# Patient Record
Sex: Female | Born: 1975
Health system: Southern US, Community
[De-identification: ages and names within clinical notes are randomized; demographics above are authoritative.]

## PROBLEM LIST (undated history)

## (undated) DIAGNOSIS — E0590 Thyrotoxicosis, unspecified without thyrotoxic crisis or storm: Secondary | ICD-10-CM

## (undated) DIAGNOSIS — R7989 Other specified abnormal findings of blood chemistry: Secondary | ICD-10-CM

## (undated) DIAGNOSIS — T7840XA Allergy, unspecified, initial encounter: Secondary | ICD-10-CM

## (undated) DIAGNOSIS — G4710 Hypersomnia, unspecified: Secondary | ICD-10-CM

## (undated) HISTORY — DX: Other specified abnormal findings of blood chemistry: R79.89

## (undated) HISTORY — DX: Allergy, unspecified, initial encounter: T78.40XA

## (undated) HISTORY — DX: Thyrotoxicosis, unspecified without thyrotoxic crisis or storm: E05.90

## (undated) HISTORY — PX: DILATION AND CURETTAGE OF UTERUS: SHX78

## (undated) HISTORY — DX: Hypersomnia, unspecified: G47.10

## (undated) HISTORY — PX: TUBAL LIGATION: SHX77

---

## 1998-08-04 ENCOUNTER — Emergency Department (HOSPITAL_COMMUNITY): Admission: EM | Admit: 1998-08-04 | Discharge: 1998-08-04 | Payer: Self-pay | Admitting: Emergency Medicine

## 1999-03-06 ENCOUNTER — Emergency Department (HOSPITAL_COMMUNITY): Admission: EM | Admit: 1999-03-06 | Discharge: 1999-03-06 | Payer: Self-pay | Admitting: Emergency Medicine

## 1999-07-02 ENCOUNTER — Emergency Department (HOSPITAL_COMMUNITY): Admission: EM | Admit: 1999-07-02 | Discharge: 1999-07-02 | Payer: Self-pay | Admitting: Emergency Medicine

## 2000-01-14 ENCOUNTER — Inpatient Hospital Stay (HOSPITAL_COMMUNITY): Admission: AD | Admit: 2000-01-14 | Discharge: 2000-01-14 | Payer: Self-pay | Admitting: *Deleted

## 2000-01-15 ENCOUNTER — Ambulatory Visit (HOSPITAL_COMMUNITY): Admission: RE | Admit: 2000-01-15 | Discharge: 2000-01-15 | Payer: Self-pay | Admitting: *Deleted

## 2000-01-23 ENCOUNTER — Encounter: Payer: Self-pay | Admitting: *Deleted

## 2000-01-23 ENCOUNTER — Ambulatory Visit (HOSPITAL_COMMUNITY): Admission: RE | Admit: 2000-01-23 | Discharge: 2000-01-23 | Payer: Self-pay | Admitting: *Deleted

## 2000-03-08 ENCOUNTER — Inpatient Hospital Stay (HOSPITAL_COMMUNITY): Admission: AD | Admit: 2000-03-08 | Discharge: 2000-03-08 | Payer: Self-pay | Admitting: Obstetrics

## 2000-03-09 ENCOUNTER — Emergency Department (HOSPITAL_COMMUNITY): Admission: EM | Admit: 2000-03-09 | Discharge: 2000-03-09 | Payer: Self-pay | Admitting: Emergency Medicine

## 2000-03-13 ENCOUNTER — Other Ambulatory Visit: Admission: RE | Admit: 2000-03-13 | Discharge: 2000-03-13 | Payer: Self-pay | Admitting: Obstetrics & Gynecology

## 2000-08-21 ENCOUNTER — Inpatient Hospital Stay (HOSPITAL_COMMUNITY): Admission: AD | Admit: 2000-08-21 | Discharge: 2000-08-21 | Payer: Self-pay | Admitting: Obstetrics & Gynecology

## 2000-09-01 ENCOUNTER — Inpatient Hospital Stay (HOSPITAL_COMMUNITY): Admission: AD | Admit: 2000-09-01 | Discharge: 2000-09-04 | Payer: Self-pay | Admitting: Obstetrics & Gynecology

## 2000-09-05 ENCOUNTER — Encounter: Admission: RE | Admit: 2000-09-05 | Discharge: 2000-10-05 | Payer: Self-pay | Admitting: Obstetrics & Gynecology

## 2000-10-06 ENCOUNTER — Encounter: Admission: RE | Admit: 2000-10-06 | Discharge: 2000-11-05 | Payer: Self-pay | Admitting: Obstetrics & Gynecology

## 2000-10-14 ENCOUNTER — Other Ambulatory Visit: Admission: RE | Admit: 2000-10-14 | Discharge: 2000-10-14 | Payer: Self-pay | Admitting: Obstetrics & Gynecology

## 2000-11-06 ENCOUNTER — Encounter: Admission: RE | Admit: 2000-11-06 | Discharge: 2000-12-06 | Payer: Self-pay | Admitting: Obstetrics & Gynecology

## 2001-04-05 ENCOUNTER — Emergency Department (HOSPITAL_COMMUNITY): Admission: EM | Admit: 2001-04-05 | Discharge: 2001-04-05 | Payer: Self-pay | Admitting: Emergency Medicine

## 2001-04-09 ENCOUNTER — Emergency Department (HOSPITAL_COMMUNITY): Admission: EM | Admit: 2001-04-09 | Discharge: 2001-04-09 | Payer: Self-pay | Admitting: Emergency Medicine

## 2001-04-09 ENCOUNTER — Encounter: Payer: Self-pay | Admitting: Emergency Medicine

## 2001-06-05 ENCOUNTER — Emergency Department (HOSPITAL_COMMUNITY): Admission: EM | Admit: 2001-06-05 | Discharge: 2001-06-06 | Payer: Self-pay

## 2001-11-22 ENCOUNTER — Other Ambulatory Visit: Admission: RE | Admit: 2001-11-22 | Discharge: 2001-11-22 | Payer: Self-pay | Admitting: Obstetrics & Gynecology

## 2002-01-25 ENCOUNTER — Encounter: Payer: Self-pay | Admitting: Emergency Medicine

## 2002-01-25 ENCOUNTER — Emergency Department (HOSPITAL_COMMUNITY): Admission: EM | Admit: 2002-01-25 | Discharge: 2002-01-25 | Payer: Self-pay | Admitting: Emergency Medicine

## 2002-04-23 ENCOUNTER — Encounter: Payer: Self-pay | Admitting: Emergency Medicine

## 2002-04-23 ENCOUNTER — Emergency Department (HOSPITAL_COMMUNITY): Admission: EM | Admit: 2002-04-23 | Discharge: 2002-04-23 | Payer: Self-pay | Admitting: Emergency Medicine

## 2002-06-08 ENCOUNTER — Emergency Department (HOSPITAL_COMMUNITY): Admission: EM | Admit: 2002-06-08 | Discharge: 2002-06-08 | Payer: Self-pay | Admitting: Emergency Medicine

## 2002-10-22 ENCOUNTER — Emergency Department (HOSPITAL_COMMUNITY): Admission: EM | Admit: 2002-10-22 | Discharge: 2002-10-22 | Payer: Self-pay | Admitting: Emergency Medicine

## 2003-03-31 ENCOUNTER — Other Ambulatory Visit: Admission: RE | Admit: 2003-03-31 | Discharge: 2003-03-31 | Payer: Self-pay | Admitting: Obstetrics & Gynecology

## 2003-05-09 ENCOUNTER — Emergency Department (HOSPITAL_COMMUNITY): Admission: EM | Admit: 2003-05-09 | Discharge: 2003-05-09 | Payer: Self-pay | Admitting: Emergency Medicine

## 2003-06-20 ENCOUNTER — Emergency Department (HOSPITAL_COMMUNITY): Admission: EM | Admit: 2003-06-20 | Discharge: 2003-06-20 | Payer: Self-pay | Admitting: Emergency Medicine

## 2004-02-01 ENCOUNTER — Ambulatory Visit: Payer: Self-pay | Admitting: Internal Medicine

## 2004-02-07 ENCOUNTER — Inpatient Hospital Stay (HOSPITAL_COMMUNITY): Admission: AD | Admit: 2004-02-07 | Discharge: 2004-02-07 | Payer: Self-pay | Admitting: Obstetrics & Gynecology

## 2004-06-13 ENCOUNTER — Other Ambulatory Visit: Admission: RE | Admit: 2004-06-13 | Discharge: 2004-06-13 | Payer: Self-pay | Admitting: Obstetrics & Gynecology

## 2004-08-01 ENCOUNTER — Ambulatory Visit (HOSPITAL_COMMUNITY): Admission: AD | Admit: 2004-08-01 | Discharge: 2004-08-02 | Payer: Self-pay | Admitting: Obstetrics & Gynecology

## 2004-08-02 ENCOUNTER — Encounter (INDEPENDENT_AMBULATORY_CARE_PROVIDER_SITE_OTHER): Payer: Self-pay | Admitting: Specialist

## 2004-08-15 ENCOUNTER — Ambulatory Visit: Payer: Self-pay | Admitting: Internal Medicine

## 2004-08-21 ENCOUNTER — Ambulatory Visit: Payer: Self-pay | Admitting: Endocrinology

## 2004-09-17 ENCOUNTER — Ambulatory Visit: Payer: Self-pay | Admitting: Endocrinology

## 2004-10-04 ENCOUNTER — Ambulatory Visit: Payer: Self-pay | Admitting: Endocrinology

## 2004-10-25 ENCOUNTER — Ambulatory Visit: Payer: Self-pay | Admitting: Endocrinology

## 2004-11-06 ENCOUNTER — Ambulatory Visit: Payer: Self-pay | Admitting: Endocrinology

## 2005-01-10 ENCOUNTER — Ambulatory Visit: Payer: Self-pay | Admitting: Endocrinology

## 2005-04-08 ENCOUNTER — Ambulatory Visit: Payer: Self-pay | Admitting: Endocrinology

## 2005-04-15 ENCOUNTER — Ambulatory Visit: Payer: Self-pay | Admitting: Internal Medicine

## 2005-05-15 ENCOUNTER — Inpatient Hospital Stay (HOSPITAL_COMMUNITY): Admission: AD | Admit: 2005-05-15 | Discharge: 2005-05-15 | Payer: Self-pay | Admitting: Obstetrics and Gynecology

## 2005-05-20 ENCOUNTER — Ambulatory Visit (HOSPITAL_COMMUNITY): Admission: RE | Admit: 2005-05-20 | Discharge: 2005-05-20 | Payer: Self-pay | Admitting: Obstetrics and Gynecology

## 2005-06-10 ENCOUNTER — Encounter (HOSPITAL_COMMUNITY): Admission: RE | Admit: 2005-06-10 | Discharge: 2005-09-08 | Payer: Self-pay | Admitting: Endocrinology

## 2005-06-20 ENCOUNTER — Ambulatory Visit: Payer: Self-pay | Admitting: Internal Medicine

## 2005-06-26 ENCOUNTER — Ambulatory Visit (HOSPITAL_COMMUNITY): Admission: RE | Admit: 2005-06-26 | Discharge: 2005-06-26 | Payer: Self-pay | Admitting: Endocrinology

## 2005-06-30 ENCOUNTER — Ambulatory Visit: Payer: Self-pay | Admitting: Endocrinology

## 2005-07-23 ENCOUNTER — Ambulatory Visit: Payer: Self-pay | Admitting: Endocrinology

## 2005-08-27 ENCOUNTER — Ambulatory Visit: Payer: Self-pay | Admitting: Endocrinology

## 2005-09-26 ENCOUNTER — Ambulatory Visit: Payer: Self-pay | Admitting: Endocrinology

## 2005-10-31 ENCOUNTER — Inpatient Hospital Stay (HOSPITAL_COMMUNITY): Admission: AD | Admit: 2005-10-31 | Discharge: 2005-10-31 | Payer: Self-pay | Admitting: *Deleted

## 2005-10-31 ENCOUNTER — Ambulatory Visit: Payer: Self-pay | Admitting: Endocrinology

## 2005-11-18 ENCOUNTER — Ambulatory Visit: Payer: Self-pay | Admitting: Endocrinology

## 2005-12-19 ENCOUNTER — Ambulatory Visit: Payer: Self-pay | Admitting: Endocrinology

## 2006-01-16 ENCOUNTER — Ambulatory Visit: Payer: Self-pay | Admitting: Endocrinology

## 2006-02-17 ENCOUNTER — Ambulatory Visit: Payer: Self-pay | Admitting: Endocrinology

## 2006-02-18 ENCOUNTER — Encounter: Payer: Self-pay | Admitting: Internal Medicine

## 2006-02-18 ENCOUNTER — Ambulatory Visit: Payer: Self-pay

## 2006-02-19 ENCOUNTER — Inpatient Hospital Stay (HOSPITAL_COMMUNITY): Admission: AD | Admit: 2006-02-19 | Discharge: 2006-02-19 | Payer: Self-pay | Admitting: Obstetrics and Gynecology

## 2006-02-25 ENCOUNTER — Inpatient Hospital Stay (HOSPITAL_COMMUNITY): Admission: RE | Admit: 2006-02-25 | Discharge: 2006-02-27 | Payer: Self-pay | Admitting: Obstetrics and Gynecology

## 2006-02-25 ENCOUNTER — Encounter (INDEPENDENT_AMBULATORY_CARE_PROVIDER_SITE_OTHER): Payer: Self-pay | Admitting: *Deleted

## 2006-08-03 ENCOUNTER — Encounter: Payer: Self-pay | Admitting: *Deleted

## 2006-08-03 DIAGNOSIS — E0590 Thyrotoxicosis, unspecified without thyrotoxic crisis or storm: Secondary | ICD-10-CM | POA: Insufficient documentation

## 2006-08-03 HISTORY — DX: Thyrotoxicosis, unspecified without thyrotoxic crisis or storm: E05.90

## 2006-08-24 ENCOUNTER — Ambulatory Visit: Payer: Self-pay | Admitting: Endocrinology

## 2006-09-24 ENCOUNTER — Ambulatory Visit: Payer: Self-pay | Admitting: Endocrinology

## 2006-11-19 ENCOUNTER — Encounter: Payer: Self-pay | Admitting: Endocrinology

## 2006-11-19 ENCOUNTER — Ambulatory Visit: Payer: Self-pay | Admitting: Endocrinology

## 2006-11-19 LAB — CONVERTED CEMR LAB
Basophils Absolute: 0 10*3/uL (ref 0.0–0.1)
Basophils Relative: 0.1 % (ref 0.0–1.0)
Eosinophils Absolute: 0.3 10*3/uL (ref 0.0–0.6)
Free T4: 0.7 ng/dL (ref 0.6–1.6)
HCT: 40.7 % (ref 36.0–46.0)
Hemoglobin: 13.9 g/dL (ref 12.0–15.0)
Lymphocytes Relative: 34.1 % (ref 12.0–46.0)
MCHC: 34.2 g/dL (ref 30.0–36.0)
MCV: 85.6 fL (ref 78.0–100.0)
Monocytes Relative: 4.4 % (ref 3.0–11.0)
Neutrophils Relative %: 55.7 % (ref 43.0–77.0)
Platelets: 370 10*3/uL (ref 150–400)
TSH: 0.38 microintl units/mL (ref 0.35–5.50)
WBC: 5.8 10*3/uL (ref 4.5–10.5)

## 2007-06-30 ENCOUNTER — Ambulatory Visit: Payer: Self-pay | Admitting: Endocrinology

## 2008-01-19 ENCOUNTER — Ambulatory Visit: Payer: Self-pay | Admitting: Endocrinology

## 2008-01-19 DIAGNOSIS — R5383 Other fatigue: Secondary | ICD-10-CM

## 2008-01-19 DIAGNOSIS — R5381 Other malaise: Secondary | ICD-10-CM | POA: Insufficient documentation

## 2008-01-19 LAB — CONVERTED CEMR LAB
Basophils Absolute: 0 10*3/uL (ref 0.0–0.1)
Basophils Relative: 0.4 % (ref 0.0–3.0)
Calcium: 9 mg/dL (ref 8.4–10.5)
Chloride: 106 meq/L (ref 96–112)
Eosinophils Absolute: 0.1 10*3/uL (ref 0.0–0.7)
GFR calc Af Amer: 126 mL/min
GFR calc non Af Amer: 104 mL/min
Hemoglobin: 13.8 g/dL (ref 12.0–15.0)
Monocytes Absolute: 0.5 10*3/uL (ref 0.1–1.0)
Platelets: 305 10*3/uL (ref 150–400)
RBC: 4.59 M/uL (ref 3.87–5.11)
Sodium: 138 meq/L (ref 135–145)
TSH: 2.01 microintl units/mL (ref 0.35–5.50)

## 2008-04-03 ENCOUNTER — Telehealth (INDEPENDENT_AMBULATORY_CARE_PROVIDER_SITE_OTHER): Payer: Self-pay | Admitting: *Deleted

## 2008-04-04 ENCOUNTER — Ambulatory Visit: Payer: Self-pay | Admitting: Endocrinology

## 2008-07-17 ENCOUNTER — Ambulatory Visit: Payer: Self-pay | Admitting: Endocrinology

## 2008-07-17 LAB — CONVERTED CEMR LAB: Free T4: 0.8 ng/dL (ref 0.6–1.6)

## 2008-09-21 ENCOUNTER — Ambulatory Visit: Payer: Self-pay | Admitting: Endocrinology

## 2008-09-21 DIAGNOSIS — D72819 Decreased white blood cell count, unspecified: Secondary | ICD-10-CM | POA: Insufficient documentation

## 2008-09-21 LAB — CONVERTED CEMR LAB
BUN: 12 mg/dL (ref 6–23)
Basophils Absolute: 0 10*3/uL (ref 0.0–0.1)
CO2: 29 meq/L (ref 19–32)
Eosinophils Absolute: 0.2 10*3/uL (ref 0.0–0.7)
Eosinophils Relative: 4.2 % (ref 0.0–5.0)
HCT: 39.8 % (ref 36.0–46.0)
MCV: 88 fL (ref 78.0–100.0)
Monocytes Relative: 7.8 % (ref 3.0–12.0)
Neutrophils Relative %: 47 % (ref 43.0–77.0)
RBC: 4.52 M/uL (ref 3.87–5.11)
RDW: 12.2 % (ref 11.5–14.6)
TSH: 1.21 microintl units/mL (ref 0.35–5.50)
WBC: 3.9 10*3/uL — ABNORMAL LOW (ref 4.5–10.5)

## 2008-12-01 ENCOUNTER — Ambulatory Visit: Payer: Self-pay | Admitting: Endocrinology

## 2008-12-01 LAB — CONVERTED CEMR LAB: Free T4: 0.8 ng/dL (ref 0.6–1.6)

## 2008-12-15 ENCOUNTER — Ambulatory Visit: Payer: Self-pay | Admitting: Endocrinology

## 2008-12-15 LAB — CONVERTED CEMR LAB: Free T4: 0.7 ng/dL (ref 0.6–1.6)

## 2009-01-30 ENCOUNTER — Ambulatory Visit: Payer: Self-pay | Admitting: Endocrinology

## 2009-03-05 ENCOUNTER — Ambulatory Visit: Payer: Self-pay | Admitting: Internal Medicine

## 2009-03-05 DIAGNOSIS — R05 Cough: Secondary | ICD-10-CM | POA: Insufficient documentation

## 2009-03-05 DIAGNOSIS — R059 Cough, unspecified: Secondary | ICD-10-CM | POA: Insufficient documentation

## 2009-03-05 DIAGNOSIS — R052 Subacute cough: Secondary | ICD-10-CM | POA: Insufficient documentation

## 2009-04-27 ENCOUNTER — Ambulatory Visit: Payer: Self-pay | Admitting: Endocrinology

## 2009-06-08 ENCOUNTER — Ambulatory Visit (HOSPITAL_COMMUNITY): Admission: RE | Admit: 2009-06-08 | Discharge: 2009-06-08 | Payer: Self-pay | Admitting: Obstetrics and Gynecology

## 2009-06-10 ENCOUNTER — Inpatient Hospital Stay (HOSPITAL_COMMUNITY): Admission: AD | Admit: 2009-06-10 | Discharge: 2009-06-10 | Payer: Self-pay | Admitting: Obstetrics and Gynecology

## 2009-06-11 ENCOUNTER — Ambulatory Visit: Payer: Self-pay | Admitting: Obstetrics and Gynecology

## 2009-06-11 ENCOUNTER — Encounter (INDEPENDENT_AMBULATORY_CARE_PROVIDER_SITE_OTHER): Payer: Self-pay | Admitting: Obstetrics and Gynecology

## 2009-06-11 ENCOUNTER — Inpatient Hospital Stay (HOSPITAL_COMMUNITY): Admission: AD | Admit: 2009-06-11 | Discharge: 2009-06-14 | Payer: Self-pay | Admitting: Obstetrics and Gynecology

## 2009-08-24 ENCOUNTER — Ambulatory Visit: Payer: Self-pay | Admitting: Endocrinology

## 2009-11-09 ENCOUNTER — Ambulatory Visit: Payer: Self-pay | Admitting: Endocrinology

## 2009-11-09 LAB — CONVERTED CEMR LAB: TSH: 0.68 microintl units/mL (ref 0.35–5.50)

## 2010-03-03 ENCOUNTER — Encounter: Payer: Self-pay | Admitting: Endocrinology

## 2010-03-12 NOTE — Miscellaneous (Signed)
Summary: Doctor, [facility]   Imported By: Lester [place] 05/04/2009 09:04:05  _____________________________________________________________________  External Attachment:    Type:   Image     Comment:   External Document

## 2010-03-12 NOTE — Assessment & Plan Note (Signed)
Summary: FU Janet [name] #   Vital Signs:  Patient profile:   35 year old female Height:      69 inches (175.26 cm) Weight:      164.13 pounds (74.60 kg) BMI:     24.33 O2 Sat:      97 % on Room air Temp:     98.1 degrees F (36.72 degrees C) oral Pulse rate:   88 / minute BP sitting:   104 / 66  (left arm) Cuff size:   regular  Vitals Entered By: Brenton Grills MA (November 09, 2009 3:22 PM)  O2 Flow:  Room air CC: F/U appt/aj   Primary Provider:  Jonny Ruiz  CC:  F/U appt/aj.  History of Present Illness: pt is now 5 mos postpartum.  she takes tapazole 5 mg once daily.  pt states she feels well in general, except for fatigue.  she is not breast-feeding.  she has lost a few lbs since last ov.  Current Medications (verified): 1)  Pre-Natal .... Once Daily 2)  Methimazole 5 Mg Tabs (Methimazole) .Marland Kitchen.. 1 Once Daily  Allergies (verified): No Known Drug Allergies  Past History:  Past Medical History: Last updated: 01/19/2008 HYPERTHYROIDISM (ICD-242.90)  Review of Systems  The patient denies fever.    Physical Exam  General:  normal appearance.   Neck:  thyroid is minimally enlarged on the right lobe.  normal on the left.   Additional Exam:  FastTSH                   0.68 uIU/mL                 0.35-5.50 Free T4                   0.90 ng/dL       Impression & Recommendations:  Problem # 1:  HYPERTHYROIDISM (ICD-242.90) well-controlled  Other Orders: TLB-TSH (Thyroid Stimulating Hormone) (84443-TSH) TLB-T4 (Thyrox), Free [id number]) Est. Patient Level III (40981)  Patient Instructions: 1)  blood tests are being ordered for you today.  please call [phone] to hear your test results. 2)  Please schedule a follow-up appointment in 3-4 months. 3)  (update: i left message on phone-tree: same rx)

## 2010-03-12 NOTE — Assessment & Plan Note (Signed)
Summary: f/u appt/#/cd   Vital Signs:  Patient profile:   35 year old female Height:      69 inches (175.26 cm) Weight:      171 pounds (77.73 kg) BMI:     25.34 O2 Sat:      97 % on Room air Temp:     98.1 degrees F (36.72 degrees C) oral Pulse rate:   70 / minute BP sitting:   112 / 78  (left arm) Cuff size:   regular  Vitals Entered By: Brenton Grills MA (August 24, 2009 3:24 PM)  O2 Flow:  Room air CC: F/U appt/Pt is currently not taking any medications/aj   Primary Provider:  Jonny Ruiz  CC:  F/U appt/Pt is currently not taking any medications/aj.  History of Present Illness: now 2 mos postpartum.  pt states she feels well in general, except for fatigue.  she gained 40 lbs with the pregnancy, and has since re-lost about half of that.  she is no longer breast-feeding.    Current Medications (verified): 1)  Pre-Natal .... Once Daily  Allergies (verified): No Known Drug Allergies  Past History:  Past Medical History: Last updated: 01/19/2008 HYPERTHYROIDISM (ICD-242.90)  Review of Systems       denies neck pain  Physical Exam  General:  normal appearance.   Neck:  thyroid is minimally enlarged on the right lobe.  normal on the left.   Additional Exam:  FastTSH                   0.51 uIU/mL                 0.35-5.50 Free T4                   0.95 ng/dL     Impression & Recommendations:  Problem # 1:  HYPERTHYROIDISM (ICD-242.90) blood tests say this will recur soon  Medications Added to Medication List This Visit: 1)  Methimazole 5 Mg Tabs (Methimazole) .Marland Kitchen.. 1 once daily  Other Orders: TLB-TSH (Thyroid Stimulating Hormone) (84443-TSH) TLB-T4 (Thyrox), Free [phone]) Est. Patient Level III (14782)  Patient Instructions: 1)  tests are being ordered for you today.  please call [phone] to hear your test results. 2)  pending the test results, please stay-off thyroid medication, and return 3 months. 3)  (update: i left message on phone-tree:  take tapazole 5  mg once daily.  if ever you have fever while taking this medication, stop it and call us, because of the risk of a rare side-effect). Prescriptions: METHIMAZOLE 5 MG TABS (METHIMAZOLE) 1 once daily  #30 x 3   Entered and Authorized by:   Minus Breeding MD   Signed by:   Minus Breeding MD on 08/24/2009   Method used:   Electronically to        [facility] [address] [phone]* (retail)       [address]       [place], [place]  [address]       Ph: [phone] or [phone]       Fax: [phone]   RxID:   [id number]

## 2010-03-12 NOTE — Assessment & Plan Note (Signed)
Summary: DR Sammuel Cooper PT--COUGH-CHEST CONGESTION-D/T-- [facility]   Vital Signs:  Patient profile:   35 year old female Height:      69 inches (175.26 cm) Weight:      175.8 pounds (79.91 kg) O2 Sat:      97 % on Room air Temp:     97.7 degrees F (36.50 degrees C) oral Pulse rate:   93 / minute BP sitting:   112 / 60  (left arm) Cuff size:   regular  Vitals Entered By: Orlan Leavens (March 05, 2009 8:59 AM)  O2 Flow:  Room air CC: Ongoing cough/ chest congestion/ Pt is 6 month pregnant Is Patient Diabetic? No Pain Assessment Patient in pain? no        Primary Care Provider:  Jonny Ruiz  CC:  Ongoing cough/ chest congestion/ Pt is 6 month pregnant.  History of Present Illness: here with c/o cough - min sputum but cough spells with deep rattle occuring few times day precipitated by chest cold in Dec no fever or ST +reflux symptoms    Current Medications (verified): 1)  Pre-Natal .... Once Daily  Allergies (verified): No Known Drug Allergies  Past History:  Past Medical History: Last updated: 01/19/2008 HYPERTHYROIDISM (ICD-242.90)  Review of Systems       The patient complains of prolonged cough.  The patient denies fever, hoarseness, chest pain, syncope, and abdominal pain.    Physical Exam  General:  normal appearance.  obviously pregnant - son at side Ears:  normal pinnae bilaterally, without erythema, swelling, or tenderness to palpation. TMs clear, without effusion, or cerumen impaction. Hearing grossly normal bilaterally  Mouth:  teeth and gums in good repair; mucous membranes moist, without lesions or ulcers. oropharynx clear without exudate, no erythema.  Lungs:  normal respiratory effort, no intercostal retractions or use of accessory muscles; normal breath sounds bilaterally - no crackles and no wheezes.    Heart:  normal rate, regular rhythm, no murmur, and no rub. BLE without edema.    Impression & Recommendations:  Problem # 1:  COUGH (ICD-786.2) no evidence  of infection (afeb, no sputum, lungs clear and normal O2) - so no abx add scheduled Mucinex consider tx of GERD if persiting cough, esp with adv pregnancy  Complete Medication List: 1)  Pre-natal  .... Once daily 2)  Mucinex 600 Mg Xr12h-tab (Guaifenesin) .Marland Kitchen.. 1 by mouth every 12 hours as needed for cough  Patient Instructions: 1)  it was good to see you today.  2)  if you develop worseing symptoms or fever, call us and we can recosider antibiotics but it does not appear necessary to use any anitbiotic at this time  3)  try adding Mucinex 1-2/day for cough as discussed - 4)  if still not improving on this medication, ask your obstertrician about adding medication for reflux as this may be exacerbating your cough symptoms  5)  Please schedule a follow-up appointment as needed.

## 2010-03-12 NOTE — Assessment & Plan Note (Signed)
Summary: FU---[facility]C   Vital Signs:  Patient profile:   35 year old female Height:      69 inches (175.26 cm) Weight:      186.50 pounds (84.77 kg) O2 Sat:      97 % on Room air Temp:     97.8 degrees F (36.56 degrees C) oral Pulse rate:   96 / minute BP sitting:   102 / 58  (left arm) Cuff size:   regular  Vitals Entered By: Josph Macho RMA (April 27, 2009 9:10 AM)  O2 Flow:  Room air CC: Follow-up visit/ pt states she is no longer taking Mucinex/ CF   Primary Provider:  Jonny Ruiz  CC:  Follow-up visit/ pt states she is no longer taking Mucinex/ CF.  History of Present Illness: pt is now [redacted] weeks gestation.  pt states she feels well in general.  she has gained 34 lbs this pregnancy.  she has been off thyroid medication for a few months now.  Current Medications (verified): 1)  Pre-Natal .... Once Daily 2)  Mucinex 600 Mg Xr12h-Tab (Guaifenesin) .Marland Kitchen.. 1 By Mouth Every 12 Hours As Needed For Cough  Allergies (verified): No Known Drug Allergies  Past History:  Past Medical History: Last updated: 01/19/2008 HYPERTHYROIDISM (ICD-242.90)  Review of Systems  The patient denies fever.    Physical Exam  General:  gravid.  no distress. Neck:  thyroid is not enlarged.  no nodule Neurologic:  no tremor Skin:  normal texture and temp.  no rash.  not diaphoretic  Psych:  Alert and cooperative; normal mood and affect; normal attention span and concentration.   Additional Exam:  FastTSH                   1.32 uIU/mL                 0.35-5.50 Free T4                   0.6 ng/dL                   4.4-0.3    Impression & Recommendations:  Problem # 1:  HYPERTHYROIDISM (ICD-242.90) normal off medication now  Other Orders: TLB-TSH (Thyroid Stimulating Hormone) (84443-TSH) TLB-T4 (Thyrox), Free [id number]) Est. Patient Level III (87564)  Patient Instructions: 1)  tests are being ordered for you today.  a few days after the test(s), please call [phone] to hear your test  results. 2)  pending the test results, please stay-off thyroid medications for now, and return 2 months after you have the baby. 3)  (update: i left message on phone-tree:  rx as we discussed)

## 2010-04-02 ENCOUNTER — Telehealth: Payer: Self-pay | Admitting: Internal Medicine

## 2010-04-04 ENCOUNTER — Ambulatory Visit: Payer: Self-pay | Admitting: Internal Medicine

## 2010-04-05 ENCOUNTER — Other Ambulatory Visit: Payer: Self-pay | Admitting: Internal Medicine

## 2010-04-05 ENCOUNTER — Encounter (INDEPENDENT_AMBULATORY_CARE_PROVIDER_SITE_OTHER): Payer: Self-pay | Admitting: *Deleted

## 2010-04-05 ENCOUNTER — Other Ambulatory Visit: Payer: Self-pay

## 2010-04-05 DIAGNOSIS — Z0000 Encounter for general adult medical examination without abnormal findings: Secondary | ICD-10-CM

## 2010-04-05 LAB — HEPATIC FUNCTION PANEL
AST: 18 U/L (ref 0–37)
Albumin: 4.1 g/dL (ref 3.5–5.2)
Alkaline Phosphatase: 66 U/L (ref 39–117)
Bilirubin, Direct: 0.1 mg/dL (ref 0.0–0.3)
Total Protein: 7 g/dL (ref 6.0–8.3)

## 2010-04-05 LAB — CBC WITH DIFFERENTIAL/PLATELET
Basophils Absolute: 0 10*3/uL (ref 0.0–0.1)
Eosinophils Absolute: 0.3 10*3/uL (ref 0.0–0.7)
HCT: 38.6 % (ref 36.0–46.0)
Hemoglobin: 13.4 g/dL (ref 12.0–15.0)
Lymphs Abs: 1.7 10*3/uL (ref 0.7–4.0)
MCHC: 34.6 g/dL (ref 30.0–36.0)
MCV: 87.4 fl (ref 78.0–100.0)
Neutro Abs: 2.6 10*3/uL (ref 1.4–7.7)
RBC: 4.42 Mil/uL (ref 3.87–5.11)

## 2010-04-05 LAB — BASIC METABOLIC PANEL
BUN: 16 mg/dL (ref 6–23)
CO2: 28 mEq/L (ref 19–32)
Creatinine, Ser: 0.7 mg/dL (ref 0.4–1.2)
Glucose, Bld: 85 mg/dL (ref 70–99)

## 2010-04-05 LAB — URINALYSIS
Bilirubin Urine: NEGATIVE
Total Protein, Urine: NEGATIVE
Urobilinogen, UA: 0.2 (ref 0.0–1.0)
pH: 7 (ref 5.0–8.0)

## 2010-04-05 LAB — LIPID PANEL: Triglycerides: 42 mg/dL (ref 0.0–149.0)

## 2010-04-09 NOTE — Progress Notes (Signed)
Summary: Lab req  Phone Note Call from Patient   Caller: Patient Summary of Call: Pt called stating she has been feeling extremely fatigued and is requesting all labs included in CPX. Pt will schedule appt to discuss results once labs have been scheduled. Initial call taken by: Margaret Pyle, CMA,  April 02, 2010 3:20 PM  Follow-up for Phone Call        ok to add pt to sched with CPX labs prior Follow-up by: Corwin Levins MD,  April 02, 2010 5:32 PM  Additional Follow-up for Phone Call Additional follow up Details #1::        Harriett Sine can we work this pt in per JWJ? Additional Follow-up by: Margaret Pyle, CMA,  April 03, 2010 8:23 AM    Additional Follow-up for Phone Call Additional follow up Details #2::    PT IS SCHEDULED TO COME TO THE LAB ON FRIDAY FEB 24 AND SHE IS WORKED IN  FOR A CPX APPT ON WED FEB 29 AT 2PM.  PT IS AWARE. Follow-up by: Hilarie Fredrickson,  April 03, 2010 9:25 AM

## 2010-04-10 ENCOUNTER — Encounter: Payer: Self-pay | Admitting: Internal Medicine

## 2010-04-10 ENCOUNTER — Ambulatory Visit (INDEPENDENT_AMBULATORY_CARE_PROVIDER_SITE_OTHER): Payer: PRIVATE HEALTH INSURANCE | Admitting: Internal Medicine

## 2010-04-10 DIAGNOSIS — G4710 Hypersomnia, unspecified: Secondary | ICD-10-CM | POA: Insufficient documentation

## 2010-04-10 DIAGNOSIS — Z0000 Encounter for general adult medical examination without abnormal findings: Secondary | ICD-10-CM

## 2010-04-10 HISTORY — DX: Hypersomnia, unspecified: G47.10

## 2010-04-18 NOTE — Assessment & Plan Note (Signed)
Summary: PER PT SIDE AND BACK PAIN--LAST VISIT W/DR JWJ: 2007 / CPX? /...   Vital Signs:  Patient profile:   35 year old female Height:      66.5 inches Weight:      158.50 pounds BMI:     25.29 O2 Sat:      97 % on Room air Temp:     98.4 degrees F oral Pulse rate:   86 / minute BP sitting:   100 / 62  (left arm) Cuff size:   regular  Vitals Entered By: Zella Ball Ewing CMA Duncan Dull) (April 10, 2010 2:13 PM)  O2 Flow:  Room air  Preventive Care Screening     declines tetanus  CC: Fatigue, nausea, side pain/RE   Primary Care Provider:  Jonny Ruiz  CC:  Fatigue, nausea, and side pain/RE.  History of Present Illness: here for wellness and f/u;  has been dizziy, nausea , decreased by mouth intake for about 3 wks fo runclear reasons, and incidently has  URI symtpoms for a few days;  Pt denies CP, worsening sob, doe, wheezing, orthopnea, pnd, worsening LE edema, palp,  or syncope Pt denies new neuro symptoms such as headache, facial or extremity weakness  Pt denies polydipsia, polyuria,   Overall good compliance with meds, trying to follow low chol diet, wt stable, little excercise however . NO GU symtpoms such as dysuria or freqeuncy.  Denies worsening depressive symptoms, suicidal ideation, or panic.  Main problem today is concern about fatigue - takes naps most days;  husband (not here today) says she snores at night,  may '"breathe funny" at night; often with non-restorative sleep, often goes back to sleep at 8am after taking son to school, sometimes with HA on awakenig in the AM.  Overall good compliance with meds, and good tolerability. No fever, wt loss, night sweats, loss of appetite or other constitutional symptoms  Pt states good ability with ADL's, low fall risk, home safety reviewed and adequate, no significant change in hearing or vision, trying to follow lower chol diet, and occasionally active only with regular excercise.  Denies recent wt gain or sinus issues.   also incidently with  low grade temp, mild ST and increased prod cough with greenih sputum.   Preventive Screening-Counseling & Management  Alcohol-Tobacco     Smoking Status: never      Drug Use:  no.    Problems Prior to Update: 1)  Cough  (ICD-786.2) 2)  Leukopenia, Mild  (ICD-288.50) 3)  Fatigue  (ICD-780.79) 4)  Hyperthyroidism  (ICD-242.90)  Medications Prior to Update: 1)  Pre-Natal .... Once Daily 2)  Methimazole 5 Mg Tabs (Methimazole) .Marland Kitchen.. 1 Once Daily  Current Medications (verified): 1)  Pre-Natal .... Once Daily 2)  Methimazole 5 Mg Tabs (Methimazole) .Marland Kitchen.. 1 Once Daily 3)  Azithromycin 250 Mg Tabs (Azithromycin) .... 2po Qd For 1 Day, Then 1po Qd For 4days, Then Stop  Allergies (verified): No Known Drug Allergies  Past History:  Past Medical History: Last updated: 01/19/2008 HYPERTHYROIDISM (ICD-242.90)  Family History: Last updated: 04/10/2010 mother with HTN, thyroid disease, elevated cholosterol, OSA aunt with cancer - ? type uncle with Lupus uncles with cancer lung  Social History: Last updated: 04/10/2010 Married 3 children  Never Smoked Alcohol use-no Drug use-no work - stay at home   Risk Factors: Smoking Status: never (04/10/2010)  Past Surgical History: Transthoracic Echocardiogram (02/18/06) EKG ( 02/17/2006) Caesarean section x 3 hx of D&C  Family History: Reviewed history and no changes  required. mother with HTN, thyroid disease, elevated cholosterol, OSA aunt with cancer - ? type uncle with Lupus uncles with cancer lung  Social History: Reviewed history and no changes required. Married 3 children  Never Smoked Alcohol use-no Drug use-no work - stay at home Drug Use:  no  Review of Systems  The patient denies anorexia, fever, weight gain, vision loss, decreased hearing, hoarseness, chest pain, syncope, dyspnea on exertion, peripheral edema, prolonged cough, headaches, hemoptysis, abdominal pain, melena, hematochezia, severe  indigestion/heartburn, hematuria, muscle weakness, suspicious skin lesions, transient blindness, difficulty walking, depression, unusual weight change, abnormal bleeding, enlarged lymph nodes, and angioedema.         all otherwise negative per pt -    Physical Exam  General:  alert and mild overweight-appearing, not ill appearing Head:  normocephalic and atraumatic.   Eyes:  vision grossly intact, pupils equal, and pupils round.   Ears:  bilat tm's red, sinus nontender Nose:  nasal dischargemucosal pallor and mucosal edema.   Mouth:  pharyngeal erythema and fair dentition.   Neck:  supple.   Lungs:  normal respiratory effort and normal breath sounds.   Heart:  normal rate and regular rhythm.   Abdomen:  soft, non-tender, and normal bowel sounds.   Msk:  no joint tenderness and no joint swelling.   Extremities:  no edema, no erythema  Neurologic:  cranial nerves II-XII intact, strength normal in all extremities, gait normal, and DTRs symmetrical and normal.   Skin:  color normal and no rashes.   Psych:  not depressed appearing and slightly anxious.     Impression & Recommendations:  Problem # 1:  Preventive Health Care (ICD-V70.0) Overall doing well, age appropriate education and counseling updated, referral for preventive services and immunizations addressed, dietary counseling and smoking status adressed , most recent labs reviewed I have personally reviewed and have noted 1.The patient's medical and social history 2.Their use of alcohol, tobacco or illicit drugs 3.Their current medications and supplements 4. Functional ability including ADL's, fall risk, home safety risk, hearing & visual impairment  5.Diet and physical activities 6.Evidence for depression or mood disorders The patients weight, height, BMI  have been recorded in the chart I have made referrals, counseling and provided education to the patient based review of the above   Problem # 2:  HYPERSOMNIA  (ICD-780.54) relatively high suspeciion for OSA, but she is hesitant for some reason for pulm referral or even ONO at home;  I encouraged her to call if she assents to referral or ONO at home  Problem # 3:  FATIGUE (ICD-780.79) exam o/w benign though she is hoping for a thyroid problem to work on  Problem # 4:  BRONCHITIS-ACUTE (ICD-466.0)  Her updated medication list for this problem includes:    Azithromycin 250 Mg Tabs (Azithromycin) .Marland Kitchen... 2po qd for 1 day, then 1po qd for 4days, then stop  Complete Medication List: 1)  Pre-natal  .... Once daily 2)  Methimazole 5 Mg Tabs (Methimazole) .Marland Kitchen.. 1 once daily 3)  Azithromycin 250 Mg Tabs (Azithromycin) .... 2po qd for 1 day, then 1po qd for 4days, then stop  Patient Instructions: 1)  Please take all new medications as prescribed - the antibiotic 2)  Continue all previous medications as before this visit  3)  You can also use Mucinex OTC or it's generic for congestion, and Delsym for cough as needed , and Advil/tylenol for pain 4)  Please call if you would like to have the ONO (overnight  oximetry) at your home, or referral to Pulmonary to be evaluated for sleep apnea 5)  Please schedule a follow-up appointment in 1 year or sooner if needed Prescriptions: AZITHROMYCIN 250 MG TABS (AZITHROMYCIN) 2po qd for 1 day, then 1po qd for 4days, then stop  #6 x 1   Entered and Authorized by:   Corwin Levins MD   Signed by:   Corwin Levins MD on 04/10/2010   Method used:   Print then Give to Patient   RxID:   [id number]    Orders Added: 1)  Est. Patient 18-39 years [14782]

## 2010-04-30 LAB — URINALYSIS, ROUTINE W REFLEX MICROSCOPIC
Bilirubin Urine: NEGATIVE
Hgb urine dipstick: NEGATIVE
Ketones, ur: NEGATIVE mg/dL
Protein, ur: NEGATIVE mg/dL
Specific Gravity, Urine: 1.01 (ref 1.005–1.030)
pH: 7 (ref 5.0–8.0)

## 2010-04-30 LAB — RPR: RPR Ser Ql: NONREACTIVE

## 2010-04-30 LAB — CBC
HCT: 38.7 % (ref 36.0–46.0)
MCV: 86.2 fL (ref 78.0–100.0)
Platelets: 211 10*3/uL (ref 150–400)
RDW: 13.8 % (ref 11.5–15.5)
RDW: 14.6 % (ref 11.5–15.5)

## 2010-05-02 ENCOUNTER — Other Ambulatory Visit: Payer: Self-pay | Admitting: Endocrinology

## 2010-05-27 ENCOUNTER — Telehealth: Payer: Self-pay

## 2010-05-27 NOTE — Telephone Encounter (Signed)
Pt informed of MD's advisement and will keep upcoming appt for tomorrow to discuss sxs.

## 2010-05-27 NOTE — Telephone Encounter (Signed)
The feb blood test was normal.  Do you want to recheck?  If normal, you should conclude that your symptoms are from some other reason.

## 2010-05-27 NOTE — Telephone Encounter (Signed)
Patient lmovm asking if MD will change her thyroid medication to something different. She has stopped her current medication due to increased heart rate and nausea. She states that she had some recent blood work with Dr Jonny Ruiz. Please advise Thanks

## 2010-05-28 ENCOUNTER — Encounter: Payer: Self-pay | Admitting: Endocrinology

## 2010-05-28 ENCOUNTER — Ambulatory Visit (INDEPENDENT_AMBULATORY_CARE_PROVIDER_SITE_OTHER): Payer: PRIVATE HEALTH INSURANCE | Admitting: Endocrinology

## 2010-05-28 VITALS — BP 112/76 | HR 73 | Temp 98.8°F | Ht 66.5 in | Wt 155.1 lb

## 2010-05-28 DIAGNOSIS — E0590 Thyrotoxicosis, unspecified without thyrotoxic crisis or storm: Secondary | ICD-10-CM

## 2010-05-28 NOTE — Patient Instructions (Signed)
Stay-off the methimazole. Return here in 4-6 weeks. Then we can recheck the blood test, see how you are feeling, an talk more about long-term treatment options.

## 2010-05-28 NOTE — Progress Notes (Signed)
  Subjective:    Patient ID: Janet [name], female    DOB: Jan 20, 1976, 35 y.o.   MRN: [id number]  HPI Pt returns for f/u of hyperthyroidism, due to grave's dz.  She is now 1 year postpartum.  She no longer breast-feeds.  She stopped tapazole 1 week ago, due to nausea and palpitations.  She says sxs are improved since she stopped it.  She says she is not ready to decide on another long-term rx plan. Past Medical History  Diagnosis Date  . HYPERTHYROIDISM 08/03/2006  . HYPERSOMNIA 04/10/2010   Past Surgical History  Procedure Date  . Cesarean section     x3  . Dilation and curettage, diagnostic / therapeutic     Hx of    reports that she has never smoked. She does not have any smokeless tobacco history on file. She reports that she does not drink alcohol or use illicit drugs. family history includes Cancer in her others; Hyperlipidemia in her mother; Hypertension in her mother; Lupus in her other; Sleep apnea in her mother; and Thyroid disease in her mother. Allergies not on file   Review of Systems Denies fever.      Objective:   Physical Exam GENERAL: no distress Neck:  Thyroid is minimally and diffusely enlarged.         Assessment & Plan:  Hyperthyroidism.  Now that she is no longer breast-feeding, she should consider i-131 rx

## 2010-06-25 NOTE — Consult Note (Signed)
[facility]                          ENDOCRINOLOGY CONSULTATION   NAME:Janet [name], Janet [name]                    MRN:          [id number]  DATE:08/24/2006                            DOB:          1975/03/01    REASON FOR VISIT:  Follow up thyroid.   HISTORY OF PRESENT ILLNESS:  A 35 year old woman with hyperthyroidism.  She did not return for followup as advised.  She takes 50 mg of PTU four  times a week.  She feels well except for slight tremor and numbness at  the left upper extremity.   PAST MEDICAL HISTORY:  She is now six months postpartum.  She is not  breastfeeding.  She wants to start oral contraceptives.   REVIEW OF SYSTEMS:  Denies fever, palpitations, and excessive  diaphoresis.   PHYSICAL EXAMINATION:  VITAL SIGNS:  Blood pressure 114/72, heart rate  85, temperature 98.1, weight 150.  GENERAL:  No distress.  HEENT:  Eyes:  Slight bilateral proptosis, no periorbital swelling.  NECK:  Thyroid is slightly and diffusely enlarged.  NEUROLOGICAL:  Alert, oriented.  Did not appear anxious or depressed.  There is a slight postural tremor.   LABORATORY DATA:  Laboratory studies on August 24, 2006 free T-4 elevated  at 1.7.  TSH low at 0.04.   IMPRESSION:  1. Exacerbation in her hyperthyroidism in the postpartum months.  2. Numbness probably due to #1.   PLAN:  1. I called the patient, and we are increasing her PTU to 100 mg twice      a day.  2. Return in 30 days.     Sean A. Everardo All, MD  Electronically Signed    SAE/MedQ  DD: 08/26/2006  DT: 08/27/2006  Job #: [id number]   cc:   Maxie Better, M.D.

## 2010-06-28 NOTE — Consult Note (Signed)
[facility]                            ENDOCRINOLOGY CONSULTATION   NAME:Hainsworth, Janet [name]                    MRN:          [id number]  DATE:10/31/2005                            DOB:          1976-02-05    REASON FOR VISIT:  Followup, hyperthyroidism.   HISTORY OF PRESENT ILLNESS:  A 35 year old woman who is now [redacted] weeks  pregnant.  She states that she is feeling no different and well in general.  She states that she is concerned that she has gained 35 pounds so far during  her pregnancy.   PAST MEDICAL HISTORY:  Otherwise healthy.  The only other medications are  prenatal vitamins.   REVIEW OF SYSTEMS:  Denies fever.   PHYSICAL EXAMINATION:  VITAL SIGNS:  Blood pressure 105/64, heart rate 92,  temperature 98.  The weight is 162.  GENERAL:  No distress.  NECK:  Thyroid is not palpable.  ABDOMEN:  Gravid.  SKIN:  Not diaphoretic.  NEUROLOGIC:  No tremor.   LABORATORY STUDIES:  On October 31, 2005, free T4 0.6.  TSH 0.30.   IMPRESSION:  1. On PTU 50 mg a day, her hyperthyroidism is controlled to the mildly      hyperthyroid range.  This is the goal for hyperthyroidism during      pregnancy.  2. Weight gain is not thyroid-related.   PLAN:  1. Continue PTU 50 mg once daily.  2. Return in three weeks.                                   Sean A. Everardo All, MD   SAE/MedQ  DD:  11/02/2005  DT:  11/04/2005  Job #:  [id number]   cc:   Maxie Better, M.D.

## 2010-06-28 NOTE — H&P (Signed)
NAMEGAGE, TREIBER             ACCOUNT NO.:  [id number]   MEDICAL RECORD NO.:  [id number]          PATIENT TYPE:  MAT   LOCATION:  MATC                          FACILITY:  [facility]   PHYSICIAN:  Gerri Spore B. Earlene Plater, M.D.  DATE OF BIRTH:  1975/07/11   DATE OF ADMISSION:  10/31/2005  DATE OF DISCHARGE:  10/31/2005                                HISTORY & PHYSICAL   CHIEF COMPLAINT:  A 23-week status post fall.   HISTORY OF PRESENT ILLNESS:  A 23-weeks, gravida 4, para 1, fell down one  flight of stairs on her back, approximately 15 hours ago.  She did not call  the office until well after hours mentioning that there was decreased or  absent fetal movement today.  No bleeding.  Has had an occasional lower  abdominal pain, mostly complaining of back pain in the mid thoracic area  where she took the brunt of her fall.   PAST MEDICAL HISTORY:  Hyperthyroidism.   MEDICATIONS:  1. Propylthiouracil.  2. Prenatal vitamins.   ALLERGIES:  None.   SOCIAL HISTORY:  Noncontributory.   PHYSICAL EXAMINATION:  VITAL SIGNS:  Afebrile, stable vitals.  Fetal heart  rate is Dopplered in the 160's.  Rare contraction on the monitor.  GENERAL:  The patient is alert and oriented, no acute distress.  BACK:  Mildly tender in the mid to low thoracic areas, but no pinpoint  severe nontender.  ABDOMEN:  Nontender.  Cervix was closed and thick per the RN.   STUDIES:  Ultrasound obtained shows viable pregnancy consistent with dates,  normal amniotic fluid volume, normal fetal movement and normal appearing  placenta.  Cervical length closed normal at 3.3 cm transabdominally.   ASSESSMENT:  Status post fall without evidence of fetal compromise,  approximately 15 hours after the fact, at this point.  We reinforced the  patient that should she fall in the future we would like to know about it  immediately for more prompt evaluation.  However, at this point, there  appears to have been no harm done.  Main issues  seems to be musculoskeletal  back pain, status post fall.   PLAN:  Discharge to home, follow up in the office next week.  Prescription  for Darvocet N-100 1-2 pills q.4-6 h p.r.n. pain.  The patient was  instructed to notify should any bleeding, increased pain or decreased  feeling ever occur.      Gerri Spore B. Earlene Plater, M.D.  Electronically Signed     WBD/MEDQ  D:  10/31/2005  T:  11/04/2005  Job:  [id number]

## 2010-06-28 NOTE — Op Note (Signed)
[name], [name]             ACCOUNT NO.:  [id number]   MEDICAL RECORD NO.:  [id number]          PATIENT TYPE:  INP   LOCATION:  9134                          [facility]ACILITY:  [facility]   PHYSICIAN:  Maxie Better, M.D.DATE OF BIRTH:  1975/10/13   DATE OF PROCEDURE:  02/25/2006  DATE OF DISCHARGE:                               OPERATIVE REPORT   PREOPERATIVE DIAGNOSES:  1. Previous cesarean section.  2. Term gestation.  3. Hyperthyroidism.   PROCEDURE:  1. Repeat cesarean section.  2. Sharl Ma hysterotomy.  3. Removal of a right paratubal cyst.   POSTOPERATIVE DIAGNOSES:  1. Previous cesarean section.  2. Term gestation.  3. Hyperthyroidism.  4. Right paratubal cyst.   ANESTHESIA:  Spinal.   SURGEON:  Maxie Better, M.D.   ASSISTANT:  Richardean Sale, M.D.   PROCEDURE:  Under adequate spinal anesthesia, the patient was placed in  a supine position with a left lateral tilt.  She was sterilely prepped  and draped in the usual fashion.  Indwelling Foley catheter was  sterilely placed.  0.25% Marcaine was injected along the previous  Pfannenstiel skin incision.  Pfannenstiel skin incision was then made  and carried down to the rectus fascia.  Rectus fascia was opened  transversely.  Rectus fascia was then bluntly and sharply dissected off  the rectus muscle in superior and inferior fashion.  The rectus muscle  was already partially separated and the parietal peritoneum was then  entered bluntly and extended.  The vesicouterine peritoneum was opened  transversely.  The lower uterine segment was very thin.  A curvilinear  low transverse uterine incision was then made and extended with bandage  scissors.  The intact amniotic sac was notable for evidence of thick  meconium, at which time a DeLee was requested.  The artificial rupture  of membranes was then performed.  The vertex presentation was delivered.  Baby was bulb-suctioned and DeLee'd on the abdomen and subsequently  it  was delivered.  Cord was clamped, cut and the baby was transferred to  the waiting pediatricians, who assigned Apgars of 9 and 9 at 1 and 5  minutes.  Placenta was spontaneous, intact, not sent to Pathology.  Uterine cavity was cleaned of debris.  Uterine incision had no extension  and was closed in 2 layers, the first layer of 0 Monocryl running lock  stitch, the second layer was imbricating using the 0 Monocryl suture.  Normal ovaries were noted bilaterally.  Normal left tube.  The right  tube was normal with the exception of a paratubal cyst, which was  removed using cautery.  The abdomen was then copiously irrigated and  suctioned of debris.  Good hemostasis was noted along the incision line.  The parietal peritoneum was not closed.  The rectus fascia was closed  with 0 Vicryl x2.  The subcutaneous areas were irrigated and small  bleeders cauterized.  Interrupted 2-0 plain sutures were then placed and  the skin approximated using Ethicon staples.  Specimens were placenta,  not sent to Pathology, and right paratubal cyst.  Estimated blood loss  was 700 mL.  Urine output  was 500 mL of clear yellow urine.  Intraoperative fluid was 3 L.  Sponge and instrument counts x2 were  correct.  Complication was none.  Weight of the baby was 7 pounds 5  ounces.  The patient tolerated the procedure well and was transferred to  recovery room in stable condition.      Maxie Better, M.D.  Electronically Signed     [place]/MEDQ  D:  02/25/2006  T:  02/25/2006  Job:  [id number]

## 2010-06-28 NOTE — Discharge Summary (Signed)
[facility]  Patient:    Janet [name], Janet [name]                       MRN: [id number] Adm. Date:  [id number] Disc. Date: [id number] Attending:  Minette Headland Dictator:   Danie Chandler, R.N.                           Discharge Summary  ADMITTING DIAGNOSES:          1. Intrauterine pregnancy at term.                               2. Breech presentation.  DISCHARGE DIAGNOSES:          1. Intrauterine pregnancy at term.                               2. Breech presentation.  PROCEDURE ON September 01, 2000:   Primary low transverse cervical cesarean                               section.  REASON FOR ADMISSION:         Please see dictated H&P.  HOSPITAL COURSE:              The patient was taken to the operating room and underwent the above-named procedure without complication. This was productive of a viable female infant with Apgars of 8 at one minute and 9 at five minutes and an arterial cord pH of 7.26. Postoperatively on day #1, the patients hemoglobin was 10.5, hematocrit 29.8, and white blood cell count 11.3. On postoperative day #1 the patient was without complaint and had a good return of bowel function. On postoperative day #2, she was ambulating well without difficulty and had good pain control. She was tolerating a regular diet and she was discharged home on postoperative day #3.  CONDITION ON DISCHARGE:       Good.  DIET:                         Regular as tolerated.  ACTIVITY:                     No heavy lifting, no driving, no vaginal entry.  DISCHARGE FOLLOWUP:           She is to follow up in the office in one to two weeks for incision check and she is to call for temperature greater than 100 degrees, persistent nausea or vomiting, heavy vaginal bleeding, and/or redness or drainage from the incision site.  DISCHARGE MEDICATIONS:        1. Prenatal vitamin one p.o. q.d.                               2. Pain medications as directed by  M.D. DD:  09/04/00 TD:  09/04/00 Job: [id number]

## 2010-06-28 NOTE — Consult Note (Signed)
[facility]                            ENDOCRINOLOGY CONSULTATION   NAME:Janet [name], Janet [name]                    MRN:          [id number]  DATE:11/18/2005                            DOB:          1975-05-29    REASON FOR VISIT:  Followup thyroid.   HISTORY OF PRESENT ILLNESS:  [age]-year-old woman who is now about [redacted]  weeks pregnant.  She has only gained a pound since her last visit.  She  feels well except for some fatigue.  She had a few uterine contractions  after a fall at home, but states she is feeling better.   PAST MEDICAL HISTORY:  No other medical problems, and her only medication  besides PTU is a prenatal vitamin once a day.   REVIEW OF SYSTEMS:  Denies fever, excessive sweating , diaphoresis, and  tremor.   PHYSICAL EXAMINATION:  Blood pressure 101/62, heart rate 89, temperature  98.4, weight 163.  GENERAL:  No distress. She is gravid.  SKIN:  Normal texture and temperature.  NECK:  Thyroid is not enlarged.  NEUROLOGIC:  No tremor.   LABORATORY DATA:  On November 18, 2005 TSH 0.64, free T4 0.4.   IMPRESSION:  Hyperthyroidism.  She is euthyroid on treatment.   PLAN:  1. Decrease the PTU to 1 tablet four times a week (Tuesday, Thursday,      Saturday, and Sunday).  2. Return in about 4 weeks.  3. She is advised if ever she has fever on this medication she must      discontinue it and call us because of a rare side effect.            ______________________________  Cleophas Dunker. Everardo All, MD    SAE/MedQ  DD:  11/18/2005  DT:  11/20/2005  Job #:  [id number]   cc:   Maxie Better, M.D.

## 2010-06-28 NOTE — Op Note (Signed)
[facility]  Patient:    Janet [name], Janet [name]                       MRN: [id number] Proc. Date: 09/01/00 Adm. Date:  [id number] Disc. Date: [id number] Attending:  Minette Headland                           Operative Report  PREOPERATIVE DIAGNOSIS:       Intrauterine pregnancy at term, breech presentation.  POSTOPERATIVE DIAGNOSIS:      Intrauterine pregnancy at term, breech presentation.  OPERATIVE PROCEDURE:          Primary low-transverse cervical cesarean section with delivery of viable female infant with Apgars of 8 and 9, cord pH 7.26 on an arterial sample.  SURGEON:                      Freddy Finner, M.D.  ANESTHESIA:                   Spinal.  ESTIMATED INTRAOPERATIVE BLOOD LOSS:                   600 cc.  INTRAOPERATIVE COMPLICATIONS: None.  HISTORY:                      The patient is a 35 year old primigravida, who has been followed through an uneventful prenatal course to term.  She was found to be breech and is now admitted for cesarean delivery.  DESCRIPTION OF PROCEDURE:     She was admitted on the morning of surgery and brought to the operating room, placed under adequate spinal anesthesia and placed in the dorsal recumbent position with elevation of the right hip.  The lower abdomen was prepped and draped in the usual fashion and a Foley catheter was placed using sterile technique.  A transverse incision was made in the lower abdomen just above the symphysis and carried sharply down to fascia. Bleeding subcutaneous vessels were controlled with the Bovie.  The fascia was entered sharply and extended to the extent of skin incision.  Rectus sheath was developed superiorly and inferiorly with blunt and sharp dissection. Rectus muscles were divided in the midline.  The peritoneum was elevated and entered sharply and extended the extent of the skin incision.  A bladder blade was placed.   A transverse incision was made in the visceral  peritoneum overlying the lower uterine segment and the bladder bluntly dissected off the lower segment.  A transverse incision was made in the lower segment and extended in a transverse direction.  Fluid was clear.  A viable female was then delivered by breech extraction without difficulty.  Apgars and cord pH were noted above.  The placenta and other parts of conception were removed from the uterus.  Uterine incision was closed with a running locking 0 Monocryl suture. The uterine cavity was confirmed to be evacuated by careful manual exploration.   The uterus was palpably normal.  Tubes and ovaries were visualized and found to be normal.  The lower uterine segment incision was closed with a running 0 Monocryl suture.  Irrigation was carried out. Hemostasis was complete.  The bladder flap was reapproximated with interrupted 0 Monocryl suture in the midline.  All packs, needles, and instruments were removed and counts were correct.  The abdominal incision was closed in layers. A running 0  Monocryl was used to close the peritoneum and reapproximate the rectus muscles.  Fascia was closed with a running 0 Panocryl.  Skin was closed with wide skin staples and quarter-inch Steri-Strips.  The patient tolerated the procedure well and was taken to recovery in good condition. DD:  09/01/00 TD:  09/01/00 Job: [id number]

## 2010-06-28 NOTE — Discharge Summary (Signed)
Janet [name], GROSSBERG             ACCOUNT NO.:  [id number]   MEDICAL RECORD NO.:  [id number]          PATIENT TYPE:  INP   LOCATION:  9134                          [facility]ACILITY:  [facility]   PHYSICIAN:  Maxie Better, M.D.DATE OF BIRTH:  September 09, 1975   DATE OF ADMISSION:  02/25/2006  DATE OF DISCHARGE:  02/27/2006                               DISCHARGE SUMMARY   ADMISSION DIAGNOSES:  1. Previous cesarean section.  2. Term gestation.  3. Hyperthyroidism.   DISCHARGE DIAGNOSIS:  1. Term gestation delivered.  2. Previous cesarean section.  3. Hyperthyroidism.  4. Paratubal cyst.   PROCEDURES:  Repeat cesarean section, right paratubal cyst removal.   HOSPITAL COURSE:  The patient was admitted to [facility].  She  underwent a repeat cesarean section.  Procedure resulted in delivery of  a live female 7 pounds 5 ounces, Apgars of 9 and 9, normal tubes and  ovaries and a right paratubal cyst which was subsequently removed.  The  patient had thick meconium at the time of her C-section.  She had an  uncomplicated postoperative course.  Her CBC on postop day #1 showed a  hemoglobin of 12.4, hematocrit 34.5, white count of 8.9, platelet count  of 183,000.  The patient had desired to go home on postop day #2  tolerating a regular diet, no evidence of infection, incision showed no  evidence of erythema or induration, was able to be discharged home.   DISPOSITION:  Home.   DISCHARGE MEDICATIONS:  1. Tylox one to two tablets every 3-4 hours p.r.n. pain.  2. Motrin 800 mg q.6-8 h. p.r.n. pain.  3. Prenatal vitamins one p.o. daily.  4. She will continue on the propylthiouracil medications per Dr.      [name]   DISCHARGE INSTRUCTIONS:  Per the postpartum instructions given.   FOLLOW UP:  Staple removal in office on Wednesday and for postpartum  visit at 6 weeks.     Maxie Better, M.D.  Electronically Signed    Maud/MEDQ  D:  03/22/2006  T:  03/22/2006  Job:  [id number]

## 2010-06-28 NOTE — Consult Note (Signed)
[facility]                            ENDOCRINOLOGY CONSULTATION   NAME:Janet [name], Janet [name]                    MRN:          [id number]  DATE:09/26/2005                            DOB:          Dec 21, 1975    REASON FOR VISIT:  Followup of thyroid.   HISTORY OF PRESENT ILLNESS:  This is a 35 year old woman who has now been  off PTU for 4 weeks.  She is now [redacted] weeks pregnant.  She has gained 16  pounds during her pregnancy.  She feels well except for a few palpitations.   PAST MEDICAL HISTORY:  Otherwise healthy.   MEDICATIONS:  Her only other medication is prenatal vitamins.   REVIEW OF SYSTEMS:  Denies the following:  Fever, excessive diaphoresis, and  tremor.   PHYSICAL EXAMINATION:  VITAL SIGNS:  Blood pressure 111/71.  Heart rate 83.  Temperature 97.3.  Weight 153.  GENERAL:  No distress.  SKIN:  Not diaphoretic.  HEENT:  No ptosis. No periorbital swelling.  NECK:  No goiter.  NEUROLOGIC:  No tremor.   LABORATORY STUDIES:  TSH 0.12.  Free T4 0.6.   IMPRESSION:  Mild hyperthyroidism during pregnancy.   PLAN:  1. Resume PTU, this time only at 50 mg once a day.  2. Return in about four weeks.                                   Sean A. Everardo All, MD   SAE/MedQ  DD:  09/28/2005  DT:  09/28/2005  Job #:  [id number]   cc:   Maxie Better, MD

## 2010-06-28 NOTE — Op Note (Signed)
[name], [name]                ACCOUNT NO.:  [id number]   MEDICAL RECORD NO.:  [id number]          PATIENT TYPE:  AMB   LOCATION:  MATC                          FACILITY:  [facility]   PHYSICIAN:  Freddy Finner, M.D.   DATE OF BIRTH:  [birth year]   DATE OF PROCEDURE:  08/02/2004  DATE OF DISCHARGE:  08/02/2004                                 OPERATIVE REPORT   PREOPERATIVE DIAGNOSIS:  Missed abortion.   POSTOPERATIVE DIAGNOSIS:  Missed abortion.   OPERATIVE PROCEDURE:  Dilation and evacuation.   ANESTHESIA:  Monitored anesthesia care and paracervical block.   ESTIMATED BLOOD LOSS:  50 mL.   The patient is a 35 year old found to have a nonviable pregnancy.  A series  of pelvic ultrasounds had confirmed nonviability.  She presented threatening  to abort.  She was brought to the operating room, placed under adequate IV  sedation, placed in the dorsal lithotomy position.  A bivalve speculum was  introduced, the cervix was visualized.  A paracervical block was placed  using 10 mL of 1% lidocaine, injections were made at 4 and 8 o'clock in the  vaginal fornices.  The cervix was dilated with Pratts to 25.  An 8 mm  suction cannula was introduced, aspiration produced obvious products of  conception.  This was continued until it was felt the cavity was evacuated.  Gentle sharp curettage and exploration with Randall stone forceps was  performed, resuction with the vacuum curet confirmed evacuation of the  uterine cavity.  The procedure was terminated.  The patient was taken to the  recovery room in good condition.       WRN/MEDQ  D:  08/20/2004  T:  08/20/2004  Job:  [id number]

## 2010-09-03 ENCOUNTER — Other Ambulatory Visit: Payer: Self-pay | Admitting: Endocrinology

## 2010-12-19 ENCOUNTER — Other Ambulatory Visit: Payer: Self-pay | Admitting: Obstetrics and Gynecology

## 2010-12-27 ENCOUNTER — Ambulatory Visit: Payer: PRIVATE HEALTH INSURANCE | Admitting: Endocrinology

## 2010-12-30 ENCOUNTER — Ambulatory Visit (INDEPENDENT_AMBULATORY_CARE_PROVIDER_SITE_OTHER): Payer: PRIVATE HEALTH INSURANCE | Admitting: Internal Medicine

## 2010-12-30 ENCOUNTER — Encounter: Payer: Self-pay | Admitting: Internal Medicine

## 2010-12-30 ENCOUNTER — Other Ambulatory Visit (INDEPENDENT_AMBULATORY_CARE_PROVIDER_SITE_OTHER): Payer: PRIVATE HEALTH INSURANCE

## 2010-12-30 VITALS — BP 100/70 | HR 75 | Temp 98.0°F | Ht 67.0 in | Wt 148.1 lb

## 2010-12-30 DIAGNOSIS — E0590 Thyrotoxicosis, unspecified without thyrotoxic crisis or storm: Secondary | ICD-10-CM

## 2010-12-30 DIAGNOSIS — Z0000 Encounter for general adult medical examination without abnormal findings: Secondary | ICD-10-CM | POA: Insufficient documentation

## 2010-12-30 DIAGNOSIS — R202 Paresthesia of skin: Secondary | ICD-10-CM | POA: Insufficient documentation

## 2010-12-30 DIAGNOSIS — G5600 Carpal tunnel syndrome, unspecified upper limb: Secondary | ICD-10-CM

## 2010-12-30 DIAGNOSIS — R209 Unspecified disturbances of skin sensation: Secondary | ICD-10-CM

## 2010-12-30 DIAGNOSIS — G5601 Carpal tunnel syndrome, right upper limb: Secondary | ICD-10-CM

## 2010-12-30 MED ORDER — PREDNISONE 10 MG PO TABS: 10.0000 mg | ORAL_TABLET | Freq: Every day | ORAL | Status: AC

## 2010-12-30 NOTE — Patient Instructions (Signed)
Take all new medications as prescribed - the prednisone, and consider otc allegra as needed after that Continue all other medications as before Please use the right wrist splint you have at night for the possible carpal tunnel If not improved, we can refer to hand surgury Please go to LAB in the Basement for the blood and/or urine tests to be done today Please call the phone number [phone] (the PhoneTree System) for results of testing in 2-3 days;  When calling, simply dial the number, and when prompted enter the MRN number above (the Medical Record Number) and the # key, then the message should start.

## 2010-12-31 ENCOUNTER — Ambulatory Visit: Payer: PRIVATE HEALTH INSURANCE | Admitting: Internal Medicine

## 2011-01-05 ENCOUNTER — Encounter: Payer: Self-pay | Admitting: Internal Medicine

## 2011-01-05 NOTE — Assessment & Plan Note (Signed)
Located facial I think related to allergy symptoms, for predpack asd,  to f/u any worsening symptoms or concerns

## 2011-01-05 NOTE — Assessment & Plan Note (Signed)
asympt - to f/u TSH,  to f/u any worsening symptoms or concerns

## 2011-01-05 NOTE — Assessment & Plan Note (Signed)
Clinical diagnosis, has right wrist splint at home, ok to re-start,  to f/u any worsening symptoms or concerns, may need NCS/EMG or NS evalaution

## 2011-01-05 NOTE — Progress Notes (Signed)
  Subjective:    Patient ID: Janet [name], female    DOB: 11/24/1975, 35 y.o.   MRN: [id number]  HPI Here with 2 wks onset shoulder and RUE pain and numbness without weakness or neck pain, and no bowel or bladder change, fever, wt loss,  worsening LE pain/numbness/weakness, gait change or falls. Also with right > left numbness and swelling in the setting of mild sinus congestion - 2 wks ongoing nasal allergy symptoms with clear congestion, itch and sneeze, without fever, pain, ST, cough or wheezing.  Pt denies chest pain, increased sob or doe, wheezing, orthopnea, PND, increased LE swelling, palpitations, dizziness or syncope.  Pt denies new neurological symptoms such as new headache, or facial or extremity weakness or numbness except for the above.   Pt denies polydipsia, polyuria.  Also concerned about thyroid with hx of hyperthyroidism, cannot recall last TSH, and Denies hyper or hypo thyroid symptoms such as voice, skin or hair change.  Overall good compliance with treatment, and good medicine tolerability.   Past Medical History  Diagnosis Date  . HYPERTHYROIDISM 08/03/2006  . HYPERSOMNIA 04/10/2010   Past Surgical History  Procedure Date  . Cesarean section     x3  . Dilation and curettage, diagnostic / therapeutic     Hx of    reports that she has never smoked. She does not have any smokeless tobacco history on file. She reports that she does not drink alcohol or use illicit drugs. family history includes Cancer in her others; Hyperlipidemia in her mother; Hypertension in her mother; Lupus in her other; Sleep apnea in her mother; and Thyroid disease in her mother. No Known Allergies Current Outpatient Prescriptions on File Prior to Visit  Medication Sig Dispense Refill  . Prenatal Vit-Fe Sulfate-FA (PRENATAL VITAMIN PO) Take 1 tablet by mouth daily.         Review of Systems Review of Systems  Constitutional: Negative for diaphoresis and unexpected weight change.  HENT: Negative  for drooling and tinnitus.   Eyes: Negative for photophobia and visual disturbance.  Respiratory: Negative for choking and stridor.   Gastrointestinal: Negative for vomiting and blood in stool.     Objective:   Physical Exam BP 100/70  Pulse 75  Temp(Src) 98 F (36.7 C) (Oral)  Ht 5\' 7"  (1.702 m)  Wt 148 lb 2 oz (67.189 kg)  BMI 23.20 kg/m2  SpO2 97%  LMP 12/25/2010 Physical Exam  VS noted, not ill appaering Constitutional: Pt appears well-developed and well-nourished.  HENT: Head: Normocephalic.  Right Ear: External ear normal.  Left Ear: External ear normal.  Eyes: Conjunctivae and EOM are normal. Pupils are equal, round, and reactive to light.  Neck: Normal range of motion. Neck supple.  Cardiovascular: Normal rate and regular rhythm. Bilat tm's mild erythema.  Sinus nontender.  Pharynx mild erythema   Pulmonary/Chest: Effort normal and breath sounds normal.  Neurological: Pt is alert. No cranial nerve deficit.  motor/sens/dtr/gait intact Skin: Skin is warm. No erythema. or rash Psychiatric:  Thought content normal. but tense, nervous 1-2+ today Assessment & Plan:

## 2011-01-09 ENCOUNTER — Ambulatory Visit: Payer: PRIVATE HEALTH INSURANCE | Admitting: Endocrinology

## 2011-01-16 ENCOUNTER — Ambulatory Visit (INDEPENDENT_AMBULATORY_CARE_PROVIDER_SITE_OTHER): Payer: PRIVATE HEALTH INSURANCE | Admitting: Endocrinology

## 2011-01-16 ENCOUNTER — Encounter: Payer: Self-pay | Admitting: Endocrinology

## 2011-01-16 DIAGNOSIS — E0590 Thyrotoxicosis, unspecified without thyrotoxic crisis or storm: Secondary | ICD-10-CM

## 2011-01-16 NOTE — Progress Notes (Signed)
  Subjective:    Patient ID: Janet [name], female    DOB: 08/02/1975, 35 y.o.   MRN: [id number]  HPI Pt returns for f/u of hyperthyroidism, due to grave's dz.  She has been off tapazole x 8 mos.  She is 19 mo postpartum.  pt states she feels well in general, except for weight loss.  She denies dizziness.   Past Medical History  Diagnosis Date  . HYPERTHYROIDISM 08/03/2006  . HYPERSOMNIA 04/10/2010    Past Surgical History  Procedure Date  . Cesarean section     x3  . Dilation and curettage, diagnostic / therapeutic     Hx of    History   Social History  . Marital Status: Married    Spouse Name: N/A    Number of Children: 3  . Years of Education: N/A   Occupational History  . Not on file.   Social History Main Topics  . Smoking status: Never Smoker   . Smokeless tobacco: Not on file  . Alcohol Use: No  . Drug Use: No  . Sexually Active:    Other Topics Concern  . 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    No Known Allergies  Family History  Problem Relation Age of Onset  . Hypertension Mother   . Hyperlipidemia Mother   . Sleep apnea Mother   . Thyroid disease Mother   . Cancer Other     Aunt with Cancer- unknown type  . Lupus Other     Uncle  . Cancer Other     Lung Cancer-Uncle   BP 98/76  Pulse 70  Temp(Src) 98.4 F (36.9 C) (Oral)  Ht 5\' 7"  (1.702 m)  Wt 146 lb 12.8 oz (66.588 kg)  BMI 22.99 kg/m2  SpO2 97%  LMP 12/25/2010  Review of Systems Denies fever    Objective:   Physical Exam VITAL SIGNS:  See vs page GENERAL: no distress NECK: There is no palpable thyroid enlargement.  No thyroid nodule is palpable.  No palpable lymphadenopathy at the anterior neck.  Lab Results  Component Value Date   TSH 0.89 12/30/2010      Assessment & Plan:  Hyperthyroidism has not yet recurred off the tapazole.

## 2011-01-16 NOTE — Patient Instructions (Addendum)
Stay-off the methimazole.  Return here in 4 months.   Your thyroid will probably become overactive again with time.

## 2011-04-25 ENCOUNTER — Encounter: Payer: Self-pay | Admitting: Internal Medicine

## 2011-04-25 ENCOUNTER — Other Ambulatory Visit: Payer: Self-pay | Admitting: Internal Medicine

## 2011-04-25 ENCOUNTER — Ambulatory Visit (INDEPENDENT_AMBULATORY_CARE_PROVIDER_SITE_OTHER): Payer: PRIVATE HEALTH INSURANCE | Admitting: Internal Medicine

## 2011-04-25 ENCOUNTER — Other Ambulatory Visit (INDEPENDENT_AMBULATORY_CARE_PROVIDER_SITE_OTHER): Payer: PRIVATE HEALTH INSURANCE

## 2011-04-25 VITALS — BP 100/62 | HR 100 | Temp 98.4°F | Ht 67.0 in | Wt 145.5 lb

## 2011-04-25 DIAGNOSIS — Z0000 Encounter for general adult medical examination without abnormal findings: Secondary | ICD-10-CM

## 2011-04-25 DIAGNOSIS — E0590 Thyrotoxicosis, unspecified without thyrotoxic crisis or storm: Secondary | ICD-10-CM

## 2011-04-25 DIAGNOSIS — R002 Palpitations: Secondary | ICD-10-CM | POA: Insufficient documentation

## 2011-04-25 DIAGNOSIS — R0600 Dyspnea, unspecified: Secondary | ICD-10-CM

## 2011-04-25 DIAGNOSIS — R0609 Other forms of dyspnea: Secondary | ICD-10-CM

## 2011-04-25 DIAGNOSIS — R000 Tachycardia, unspecified: Secondary | ICD-10-CM

## 2011-04-25 HISTORY — DX: Dyspnea, unspecified: R06.00

## 2011-04-25 LAB — CBC WITH DIFFERENTIAL/PLATELET
Eosinophils Relative: 4.4 % (ref 0.0–5.0)
HCT: 37.4 % (ref 36.0–46.0)
Hemoglobin: 12.5 g/dL (ref 12.0–15.0)
Lymphs Abs: 1.5 10*3/uL (ref 0.7–4.0)
Monocytes Relative: 11.7 % (ref 3.0–12.0)
Neutro Abs: 2.1 10*3/uL (ref 1.4–7.7)
WBC: 4.4 10*3/uL — ABNORMAL LOW (ref 4.5–10.5)

## 2011-04-25 LAB — BASIC METABOLIC PANEL
CO2: 26 mEq/L (ref 19–32)
Chloride: 107 mEq/L (ref 96–112)
Creatinine, Ser: 0.6 mg/dL (ref 0.4–1.2)
Glucose, Bld: 84 mg/dL (ref 70–99)

## 2011-04-25 LAB — URINALYSIS, ROUTINE W REFLEX MICROSCOPIC
Hgb urine dipstick: NEGATIVE
Ketones, ur: NEGATIVE
Urine Glucose: NEGATIVE
Urobilinogen, UA: 0.2 (ref 0.0–1.0)

## 2011-04-25 LAB — LIPID PANEL
LDL Cholesterol: 57 mg/dL (ref 0–99)
VLDL: 10 mg/dL (ref 0.0–40.0)

## 2011-04-25 LAB — HEPATIC FUNCTION PANEL
ALT: 55 U/L — ABNORMAL HIGH (ref 0–35)
Albumin: 3.6 g/dL (ref 3.5–5.2)
Bilirubin, Direct: 0.1 mg/dL (ref 0.0–0.3)
Total Protein: 7 g/dL (ref 6.0–8.3)

## 2011-04-25 LAB — TSH: TSH: 0.03 u[IU]/mL — ABNORMAL LOW (ref 0.35–5.50)

## 2011-04-25 MED ORDER — METHIMAZOLE 5 MG PO TABS: 5.0000 mg | ORAL_TABLET | Freq: Three times a day (TID) | ORAL | Status: DC

## 2011-04-25 NOTE — Assessment & Plan Note (Addendum)
ECG reviewed as per emr - sinus tach without acute; ? Recurrent hyperthyroidism - hold on meds for now, for f/u TSH

## 2011-04-25 NOTE — Patient Instructions (Signed)
Continue all other medications as before Your EKG was OK today Please go to XRAY in the Basement for the x-ray test Please go to LAB in the Basement for the blood and/or urine tests to be done today You will be contacted by phone if any changes need to be made immediately.  Otherwise, you will receive a letter about your results with an explanation. If the tests are normal, please consider doing Lung Testing such as Pulmonary Function Tests and Echocardiogram Please return in 1 year for your yearly visit, or sooner if needed, with Lab testing done 3-5 days before

## 2011-04-25 NOTE — Progress Notes (Signed)
Subjective:    Patient ID: Janet [name], female    DOB: 09/30/1975, 36 y.o.   MRN: [id number]  HPI   Here for wellness and f/u;  Overall doing ok;  Pt denies CP, worsening SOB, DOE, wheezing, orthopnea, PND, worsening LE edema, palpitations, dizziness or syncope except for c/o 4 wks ongoing sob/doe and heart palpitiations with minimal exertion, such as going up or down a flight of stairs.  Denies worsening depressive symptoms, suicidal ideation, or panic.   Pt denies neurological change such as new Headache, facial or extremity weakness.  Pt denies polydipsia, polyuria, or low sugar symptoms. Pt states overall good compliance with treatment and medications, good tolerability, and trying to follow lower cholesterol diet.  Pt denies worsening depressive symptoms, suicidal ideation or panic. No fever, wt loss, night sweats, loss of appetite, or other constitutional symptoms.  Pt states good ability with ADL's, low fall risk, home safety reviewed and adequate, no significant changes in hearing or vision, and occasionally active with exercise.   Past Medical History  Diagnosis Date  . HYPERTHYROIDISM 08/03/2006  . HYPERSOMNIA 04/10/2010   Past Surgical History  Procedure Date  . Cesarean section     x3  . Dilation and curettage, diagnostic / therapeutic     Hx of    reports that she has never smoked. She does not have any smokeless tobacco history on file. She reports that she does not drink alcohol or use illicit drugs. family history includes Cancer in her others; Hyperlipidemia in her mother; Hypertension in her mother; Lupus in her other; Sleep apnea in her mother; and Thyroid disease in her mother. No Known Allergies No current outpatient prescriptions on file prior to visit.   Review of Systems Review of Systems  Constitutional: Negative for diaphoresis, activity change, appetite change and unexpected weight change.  HENT: Negative for hearing loss, ear pain, facial swelling, mouth sores  and neck stiffness.   Eyes: Negative for pain, redness and visual disturbance.  Respiratory: Negative for shortness of breath and wheezing.   Cardiovascular: Negative for chest pain and palpitations.  Gastrointestinal: Negative for diarrhea, blood in stool, abdominal distention and rectal pain.  Genitourinary: Negative for hematuria, flank pain and decreased urine volume.  Musculoskeletal: Negative for myalgias and joint swelling.  Skin: Negative for color change and wound.  Neurological: Negative for syncope and numbness.  Hematological: Negative for adenopathy.  Psychiatric/Behavioral: Negative for hallucinations, self-injury, decreased concentration and agitation.     Objective:   Physical Exam BP 100/62  Pulse 100  Temp(Src) 98.4 F (36.9 C) (Oral)  Ht 5\' 7"  (1.702 m)  Wt 145 lb 8 oz (65.998 kg)  BMI 22.79 kg/m2  SpO2 97% Physical Exam  VS noted Constitutional: Pt is oriented to person, place, and time. Appears well-developed and well-nourished.  HENT:  Head: Normocephalic and atraumatic.  Right Ear: External ear normal.  Left Ear: External ear normal.  Nose: Nose normal.  Mouth/Throat: Oropharynx is clear and moist.  Eyes: Conjunctivae and EOM are normal. Pupils are equal, round, and reactive to light.  Neck: Normal range of motion. Neck supple. No JVD present. No tracheal deviation present.  Cardiovascular: Normal rate, regular rhythm, normal heart sounds and intact distal pulses.   Pulmonary/Chest: Effort normal and breath sounds normal.  Abdominal: Soft. Bowel sounds are normal. There is no tenderness.  Musculoskeletal: Normal range of motion. Exhibits no edema.  Lymphadenopathy:  Has no cervical adenopathy.  Neurological: Pt is alert and oriented to person, place,  and time. Pt has normal reflexes. No cranial nerve deficit.  Skin: Skin is warm and dry. No rash noted.  Psychiatric:  Has  normal mood and affect. Behavior is normal.     Assessment & Plan:

## 2011-04-27 ENCOUNTER — Encounter: Payer: Self-pay | Admitting: Internal Medicine

## 2011-04-27 NOTE — Assessment & Plan Note (Addendum)
Exam benign, for cxr today, but consider pft's, echo but delcines at this time

## 2011-04-27 NOTE — Assessment & Plan Note (Signed)

## 2011-05-30 ENCOUNTER — Ambulatory Visit: Payer: PRIVATE HEALTH INSURANCE | Admitting: Internal Medicine

## 2011-06-05 ENCOUNTER — Ambulatory Visit (INDEPENDENT_AMBULATORY_CARE_PROVIDER_SITE_OTHER): Payer: PRIVATE HEALTH INSURANCE | Admitting: Endocrinology

## 2011-06-05 ENCOUNTER — Other Ambulatory Visit (INDEPENDENT_AMBULATORY_CARE_PROVIDER_SITE_OTHER): Payer: PRIVATE HEALTH INSURANCE

## 2011-06-05 ENCOUNTER — Encounter: Payer: Self-pay | Admitting: Endocrinology

## 2011-06-05 VITALS — BP 108/72 | HR 74 | Temp 98.3°F | Ht 67.0 in | Wt 146.0 lb

## 2011-06-05 DIAGNOSIS — E0590 Thyrotoxicosis, unspecified without thyrotoxic crisis or storm: Secondary | ICD-10-CM

## 2011-06-05 NOTE — Progress Notes (Signed)
  Subjective:    Patient ID: Janet [name], female    DOB: [birth year], 36 y.o.   MRN: [id number]  HPI Pt returns for f/u of hyperthyroidism, due to grave's dz.  She was off tapazole for a year.  She was restarted on tapazole 1 month ago, when pt reported doe, and TFT were abnormal again.    Past Medical History  Diagnosis Date  . HYPERTHYROIDISM 08/03/2006  . HYPERSOMNIA 04/10/2010    Past Surgical History  Procedure Date  . Cesarean section     x3  . Dilation and curettage, diagnostic / therapeutic     Hx of    History   Social History  . Marital Status: Married    Spouse Name: N/A    Number of Children: 3  . Years of Education: N/A   Occupational History  . Not on file.   Social History Main Topics  . Smoking status: Never Smoker   . Smokeless tobacco: Not on file  . Alcohol Use: No  . Drug Use: No  . Sexually Active:    Other Topics Concern  . 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 methimazole (TAPAZOLE) 5 MG tablet Take 1 tablet (5 mg total) by mouth 3 (three) times daily.  30 tablet  5    No Known Allergies  Family History  Problem Relation Age of Onset  . Hypertension Mother   . Hyperlipidemia Mother   . Sleep apnea Mother   . Thyroid disease Mother   . Cancer Other     Aunt with Cancer- unknown type  . Lupus Other     Uncle  . Cancer Other     Lung Cancer-Uncle    BP 108/72  Pulse 74  Temp(Src) 98.3 F (36.8 C) (Oral)  Ht 5\' 7"  (1.702 m)  Wt 146 lb (66.225 kg)  BMI 22.87 kg/m2  SpO2 98%  LMP 06/02/2011  Review of Systems Denies fever    Objective:   Physical Exam VITAL SIGNS:  See vs page GENERAL: no distress NECK: There is slight and diffuse thyroid enlargement.  No thyroid nodule is palpable.  No palpable lymphadenopathy at the anterior neck.   Lab Results  Component Value Date   TSH 0.08* 06/05/2011        Assessment & Plan:  Hyperthyroidism, improved

## 2011-06-05 NOTE — Patient Instructions (Addendum)
blood tests are being requested for you today.  You will receive a letter with results. if ever you have fever while taking methimazole, stop it and call us, because of the risk of a rare side-effect.   In view of your medical condition, you should avoid pregnancy until we have decided it is safe Please come back for a follow-up appointment in 2 months (note: there are 2 letters today.  The correct one is the one which says to continue the same methimazole).

## 2011-06-06 ENCOUNTER — Telehealth: Payer: Self-pay | Admitting: *Deleted

## 2011-06-06 NOTE — Telephone Encounter (Signed)
Called pt to inform of thyroid results, pt informed via VM and to callback office with any questions/concerns (letter also mailed to pt).

## 2011-07-21 ENCOUNTER — Other Ambulatory Visit: Payer: Self-pay | Admitting: Internal Medicine

## 2011-07-21 NOTE — Telephone Encounter (Signed)
Denied-Last Rx 03.15.13 #30x5 to [facility] soon to fill/SLS

## 2011-07-24 ENCOUNTER — Telehealth: Payer: Self-pay

## 2011-07-24 DIAGNOSIS — E0590 Thyrotoxicosis, unspecified without thyrotoxic crisis or storm: Secondary | ICD-10-CM

## 2011-07-24 NOTE — Telephone Encounter (Signed)
Pt called requesting referral to a different endocrinologist - Dr Altheimer

## 2011-07-24 NOTE — Telephone Encounter (Signed)
Done per emr 

## 2011-08-08 ENCOUNTER — Other Ambulatory Visit: Payer: Self-pay

## 2011-08-08 MED ORDER — METHIMAZOLE 5 MG PO TABS: 5.0000 mg | ORAL_TABLET | Freq: Three times a day (TID) | ORAL | Status: DC

## 2011-11-23 ENCOUNTER — Emergency Department (HOSPITAL_COMMUNITY)
Admission: EM | Admit: 2011-11-23 | Discharge: 2011-11-23 | Disposition: A | Discharge status: Home / Self Care | Payer: PRIVATE HEALTH INSURANCE | Attending: Emergency Medicine | Admitting: Emergency Medicine

## 2011-11-23 ENCOUNTER — Emergency Department (HOSPITAL_COMMUNITY): Payer: PRIVATE HEALTH INSURANCE

## 2011-11-23 ENCOUNTER — Encounter (HOSPITAL_COMMUNITY): Payer: Self-pay | Admitting: *Deleted

## 2011-11-23 DIAGNOSIS — M25511 Pain in right shoulder: Secondary | ICD-10-CM

## 2011-11-23 DIAGNOSIS — R200 Anesthesia of skin: Secondary | ICD-10-CM

## 2011-11-23 DIAGNOSIS — F411 Generalized anxiety disorder: Secondary | ICD-10-CM | POA: Insufficient documentation

## 2011-11-23 DIAGNOSIS — M25519 Pain in unspecified shoulder: Secondary | ICD-10-CM | POA: Insufficient documentation

## 2011-11-23 DIAGNOSIS — R209 Unspecified disturbances of skin sensation: Secondary | ICD-10-CM | POA: Insufficient documentation

## 2011-11-23 MED ORDER — DIAZEPAM 5 MG PO TABS: 5.0000 mg | ORAL_TABLET | Freq: Four times a day (QID) | ORAL | Status: DC | PRN

## 2011-11-23 MED ORDER — DIAZEPAM 5 MG PO TABS
5.0000 mg | ORAL_TABLET | Freq: Once | ORAL | Status: AC
  Administered 2011-11-23: 5 mg via ORAL
  Filled 2011-11-23: qty 1

## 2011-11-23 NOTE — ED Notes (Signed)
Patient transported to X-ray [employer] pt thought of leaving without it but when x-ray came she changed her mind and decided to go ahead with the x-ray)

## 2011-11-23 NOTE — ED Notes (Signed)
Pt states that she has been having pain to right shoulder and sometimes goes to neck and arm for the last month.  Pt has pain with movement of arm.  Pt reports some numbness around mouth that lasted about 10 minutes today.  No chest pain but felt like heart was beating fast and maybe some anxiety

## 2011-11-23 NOTE — ED Notes (Addendum)
Pt is appearing anxious about her symptoms.  Spoke with her regarding valium which she is getting due to all her symptoms speaking for a injury which would be best treated with this. NP notified of pt concern.

## 2011-11-23 NOTE — ED Provider Notes (Signed)
History     CSN: [id number]  Arrival date & time 11/23/11  1029   First MD Initiated Contact with Patient 11/23/11 1100      Chief Complaint  Patient presents with  . Arm Pain    (Consider location/radiation/quality/duration/timing/severity/associated sxs/prior treatment) HPI  36 y.o. female in no acute distress complaining of pain to right shoulder she's had this intermittently for several months she has seen orthopedists to further evaluate this she went to pick up her child last night the pain has been constant since then it is rated at 8/10 not relieved by ibuprofen. Pain is exacerbated by movement. She also reports numbness around her mouth the last approximately 10 minutes earlier in the day. Patient has a history of anxiety but does not feel that she is anxious at this moment.  Past Medical History  Diagnosis Date  . HYPERTHYROIDISM 08/03/2006  . HYPERSOMNIA 04/10/2010    Past Surgical History  Procedure Date  . Cesarean section     x3  . Dilation and curettage, diagnostic / therapeutic     Hx of    Family History  Problem Relation Age of Onset  . Hypertension Mother   . Hyperlipidemia Mother   . Sleep apnea Mother   . Thyroid disease Mother   . Cancer Other     Aunt with Cancer- unknown type  . Lupus Other     Uncle  . Cancer Other     Lung Cancer-Uncle    History  Substance Use Topics  . Smoking status: Never Smoker   . Smokeless tobacco: Not on file  . Alcohol Use: No    OB History    Grav Para Term Preterm Abortions TAB SAB Ect Mult Living                  Review of Systems  Constitutional: Negative for fever.  Respiratory: Negative for shortness of breath.   Cardiovascular: Negative for chest pain.  Gastrointestinal: Negative for nausea, vomiting, abdominal pain and diarrhea.  All other systems reviewed and are negative.    Allergies  Sulfa antibiotics  Home Medications   Current Outpatient Rx  Name Route Sig Dispense Refill  .  METHIMAZOLE 5 MG PO TABS Oral Take 1 tablet (5 mg total) by mouth 3 (three) times daily. 90 tablet 2    BP 107/61  Pulse 101  Temp 98.5 F (36.9 C) (Oral)  Resp 18  SpO2 98%  LMP 10/31/2011  Physical Exam  Nursing note and vitals reviewed. Constitutional: She is oriented to person, place, and time. She appears well-developed and well-nourished. No distress.  HENT:  Head: Normocephalic.  Eyes: Conjunctivae normal and EOM are normal. Pupils are equal, round, and reactive to light.  Cardiovascular: Normal rate, regular rhythm and normal heart sounds.   Pulmonary/Chest: Effort normal. No stridor.  Abdominal: Soft.  Musculoskeletal: Normal range of motion.       The patient and abduction the right shoulder greater than 110, although it induces pain. Drop arm test is negative. Patient has muscle spasm to  right trapezius, with tenderness to palpation. Neurovascularly intact. No tenderness to palpation of rotator cuff  Neurological: She is alert and oriented to person, place, and time.       Cranial nerves III through XII intact, strength 5 out of 5x4 extremities, negative pronator drift, finger to nose and heel-to-shin coordinated, sensation intact to pinprick and light touch, gait is coordinated and Romberg is negative.    Psychiatric: She has  a normal mood and affect.    ED Course  Procedures (including critical care time)  Labs Reviewed - No data to display Dg Shoulder Right  11/23/2011  *RADIOLOGY REPORT*  Clinical Data: Right shoulder pain for 2 months  RIGHT SHOULDER - 2+ VIEW  Comparison: None.  Findings: No acute fracture or dislocation is noted.  Slight widening of the acromioclavicular joint is noted likely related to a chronic injury.  No acute soft tissue abnormality is seen  IMPRESSION: No acute abnormality noted.   Original Report Authenticated By: Phillips Odor, M.D.      1. Arthralgia of right shoulder region   2. Perioral numbness       MDM  Discussed case  with attending Dr. Radford Pax who has low index of suspicion for acute process with respect to the perioral numbness. On the differential is anxiety and MS, patient will be scheduled for outpatient MRI.  With respect to the shoulder there is clearly a muscle spasm, will treat her with Valium patient has orthopedic care I will ask her to followup with her personal orthopedist.   Pt verbalized understanding and agrees with care plan. Outpatient follow-up and return precautions given.     New Prescriptions   DIAZEPAM (VALIUM) 5 MG TABLET    Take 1 tablet (5 mg total) by mouth every 6 (six) hours as needed for anxiety (spasms).          Wynetta Emery, PA-C 11/23/11 1236

## 2011-11-23 NOTE — ED Provider Notes (Signed)
Medical screening examination/treatment/procedure(s) were performed by non-physician practitioner and as supervising physician I was immediately available for consultation/collaboration.    Nelia Shi, MD 11/23/11 1248

## 2011-11-26 ENCOUNTER — Ambulatory Visit: Payer: PRIVATE HEALTH INSURANCE | Admitting: Internal Medicine

## 2011-11-28 ENCOUNTER — Ambulatory Visit (INDEPENDENT_AMBULATORY_CARE_PROVIDER_SITE_OTHER): Payer: PRIVATE HEALTH INSURANCE | Admitting: Internal Medicine

## 2011-11-28 ENCOUNTER — Encounter: Payer: Self-pay | Admitting: Internal Medicine

## 2011-11-28 VITALS — BP 102/68 | HR 89 | Temp 98.6°F | Resp 16 | Wt 156.0 lb

## 2011-11-28 DIAGNOSIS — M25511 Pain in right shoulder: Secondary | ICD-10-CM | POA: Insufficient documentation

## 2011-11-28 DIAGNOSIS — M25519 Pain in unspecified shoulder: Secondary | ICD-10-CM

## 2011-11-28 MED ORDER — TRAMADOL HCL 50 MG PO TABS: 50.0000 mg | ORAL_TABLET | Freq: Four times a day (QID) | ORAL | Status: DC | PRN

## 2011-11-28 NOTE — Patient Instructions (Addendum)
Take all new medications as prescribed Continue all other medications as before, including the muscle relaxer you have at home You will be contacted regarding the referral for: MRI for the right shoulder Please call if you would also like referral to orthopedic, such as [facility] Please remember to sign up for My Chart at your earliest convenience, as this will be important to you in the future with finding out test results.

## 2011-11-28 NOTE — Progress Notes (Signed)
  Subjective:    Patient ID: Janet [name], female    DOB: [birth year], 36 y.o.   MRN: [id number]  HPI  Here with right shoulder pain, now mod to severe, sharp intermittent to start but now more constant, better with ibuprofen and muscle relaxer after saw orthopedic at [place] [facility] (had film there, she's not sure of exact results) but now pain worse overall in the past wk especially to move the right arm across the body to the left, radiates somewhat the right post neck and right arm to the elbow, but o/w no radicular pain/weakness/numbness;  Is very difficult to abduct past about 90 degrees at this time.  Has hx of right cts but this is different.  No recent overuse or truam, fever, swelling, rash.  No prior hx of right shoulder issues.  Is hoping for MRI today, as she only had plain film when seeing the ortho and wants to "go deeper b/c I know my own body."   Past Medical History  Diagnosis Date  . HYPERTHYROIDISM 08/03/2006  . HYPERSOMNIA 04/10/2010   Past Surgical History  Procedure Date  . Cesarean section     x3  . Dilation and curettage, diagnostic / therapeutic     Hx of    reports that she has never smoked. She does not have any smokeless tobacco history on file. She reports that she does not drink alcohol or use illicit drugs. family history includes Cancer in her others; Hyperlipidemia in her mother; Hypertension in her mother; Lupus in her other; Sleep apnea in her mother; and Thyroid disease in her mother. Allergies  Allergen Reactions  . Sulfa Antibiotics Shortness Of Breath and Swelling   Current Outpatient Prescriptions on File Prior to Visit  Medication Sig Dispense Refill  . diazepam (VALIUM) 5 MG tablet Take 1 tablet (5 mg total) by mouth every 6 (six) hours as needed for anxiety (spasms).  10 tablet  0  . methimazole (TAPAZOLE) 5 MG tablet Take 1 tablet (5 mg total) by mouth 3 (three) times daily.  90 tablet  2   Review of Systems  Constitutional: Negative for diaphoresis  and unexpected weight change.  HENT: Negative for tinnitus.   Eyes: Negative for photophobia and visual disturbance.  Respiratory: Negative for choking and stridor.   Gastrointestinal: Negative for vomiting and blood in stool.  Genitourinary: Negative for hematuria and decreased urine volume.  Musculoskeletal: Negative for gait problem.  Skin: Negative for color change and wound.  Neurological: Negative for tremors and numbness.  Psychiatric/Behavioral: Negative for decreased concentration. The patient is not hyperactive.      Objective:   Physical Exam BP 102/68  Pulse 89  Temp 98.6 F (37 C) (Oral)  Resp 16  Wt 156 lb (70.761 kg)  SpO2 97%  LMP 10/31/2011 Physical Exam  VS noted Constitutional: Pt appears well-developed and well-nourished.  HENT: Head: Normocephalic.  Right Ear: External ear normal.  Left Ear: External ear normal.  Eyes: Conjunctivae and EOM are normal. Pupils are equal, round, and reactive to light.  Neck: Normal range of motion. Neck supple.  Cardiovascular: Normal rate and regular rhythm.   Pulmonary/Chest: Effort normal and breath sounds normal.  Right shoulder with diffuse tender, worse to post shoulder and trapezoid area, with decreased ROM to 90 degrees only abduction Neurological: Pt is alert. Not confused  Skin: Skin is warm. No erythema.  Psychiatric: Pt behavior is normal. Thought content normal.     Assessment & Plan:

## 2011-11-29 ENCOUNTER — Encounter: Payer: Self-pay | Admitting: Internal Medicine

## 2011-11-29 NOTE — Assessment & Plan Note (Addendum)
With gradual worsening ROM since last seeing orthopedic, for MRI shoulder, pain control,  to f/u any worsening symptoms or concerns, declines ortho referral for now pending MRI results

## 2011-12-03 ENCOUNTER — Ambulatory Visit
Admission: RE | Admit: 2011-12-03 | Discharge: 2011-12-03 | Disposition: A | Discharge status: Home / Self Care | Payer: PRIVATE HEALTH INSURANCE | Source: Ambulatory Visit | Attending: Internal Medicine | Admitting: Internal Medicine

## 2011-12-03 DIAGNOSIS — M25511 Pain in right shoulder: Secondary | ICD-10-CM

## 2011-12-31 ENCOUNTER — Other Ambulatory Visit: Payer: Self-pay | Admitting: Endocrinology

## 2011-12-31 NOTE — Telephone Encounter (Signed)
Pt need small refill on methamazole to last until her fu appt on 01/19/12 sent to [facility] on corner of [address]. Please advise

## 2012-01-01 MED ORDER — METHIMAZOLE 5 MG PO TABS: 5.0000 mg | ORAL_TABLET | Freq: Three times a day (TID) | ORAL | Status: DC

## 2012-01-14 ENCOUNTER — Ambulatory Visit: Payer: PRIVATE HEALTH INSURANCE | Admitting: Endocrinology

## 2012-01-28 ENCOUNTER — Ambulatory Visit: Payer: PRIVATE HEALTH INSURANCE | Admitting: Endocrinology

## 2012-02-27 ENCOUNTER — Ambulatory Visit (INDEPENDENT_AMBULATORY_CARE_PROVIDER_SITE_OTHER): Payer: PRIVATE HEALTH INSURANCE | Admitting: Endocrinology

## 2012-02-27 ENCOUNTER — Encounter: Payer: Self-pay | Admitting: Endocrinology

## 2012-02-27 VITALS — BP 122/80 | HR 78 | Wt 153.0 lb

## 2012-02-27 DIAGNOSIS — Z0000 Encounter for general adult medical examination without abnormal findings: Secondary | ICD-10-CM

## 2012-02-27 DIAGNOSIS — E0590 Thyrotoxicosis, unspecified without thyrotoxic crisis or storm: Secondary | ICD-10-CM

## 2012-02-27 LAB — T4, FREE: Free T4: 0.96 ng/dL (ref 0.60–1.60)

## 2012-02-27 LAB — LIPID PANEL
HDL: 60.5 mg/dL (ref >39.00)
LDL Cholesterol: 68 mg/dL (ref 0–99)
VLDL: 8 mg/dL (ref 0.0–40.0)

## 2012-02-27 NOTE — Patient Instructions (Addendum)
blood tests are being requested for you today.  We'll contact you with results.  if ever you have fever while taking methimazole, stop it and call us, because of the risk of a rare side-effect. Please come back for a follow-up appointment in 6 months.   

## 2012-02-27 NOTE — Progress Notes (Signed)
  Subjective:    Patient ID: Janet [name], female    DOB: [birth year], 37 y.o.   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  She has been off tapazole x 1 week.  pt states she feels well in general, except for slight pain at the low-back pain. She has had tubal ligation.   Past Medical History  Diagnosis Date  . HYPERTHYROIDISM 08/03/2006  . HYPERSOMNIA 04/10/2010    Past Surgical History  Procedure Date  . Cesarean section     x3  . Dilation and curettage, diagnostic / therapeutic     Hx of    History   Social History  . Marital Status: Married    Spouse Name: N/A    Number of Children: 3  . Years of Education: N/A   Occupational History  . Not on file.   Social History Main Topics  . Smoking status: Never Smoker   . Smokeless tobacco: Not on file  . Alcohol Use: No  . Drug Use: No  . Sexually Active:    Other Topics Concern  . 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 diazepam (VALIUM) 5 MG tablet Take 1 tablet (5 mg total) by mouth every 6 (six) hours as needed for anxiety (spasms).  10 tablet  0  . traMADol (ULTRAM) 50 MG tablet Take 1 tablet (50 mg total) by mouth every 6 (six) hours as needed for pain.  60 tablet  1    Allergies  Allergen Reactions  . Sulfa Antibiotics Shortness Of Breath and Swelling    Family History  Problem Relation Age of Onset  . Hypertension Mother   . Hyperlipidemia Mother   . Sleep apnea Mother   . Thyroid disease Mother   . Cancer Other     Aunt with Cancer- unknown type  . Lupus Other     Uncle  . Cancer Other     Lung Cancer-Uncle    BP 122/80  Pulse 78  Wt 153 lb (69.4 kg)  SpO2 95%    Review of Systems Denies fever.     Objective:   Physical Exam VITAL SIGNS:  See vs page. GENERAL: no distress. NECK: There is no palpable  thyroid enlargement.  No thyroid nodule is palpable.  No palpable. lymphadenopathy at the anterior neck.  (dip ua is neg, except for trace protein)    Assessment & Plan:  Hyperthyroidism, needs increased rx.  therapy limited by noncompliance.  i'll do the best i can. Low-back pain, new, needs increased rx

## 2012-02-29 MED ORDER — METHIMAZOLE 10 MG PO TABS: 10.0000 mg | ORAL_TABLET | Freq: Every day | ORAL | Status: DC

## 2012-03-27 ENCOUNTER — Other Ambulatory Visit: Payer: Self-pay

## 2012-11-24 ENCOUNTER — Encounter: Payer: Self-pay | Admitting: Internal Medicine

## 2012-11-24 ENCOUNTER — Ambulatory Visit (INDEPENDENT_AMBULATORY_CARE_PROVIDER_SITE_OTHER): Payer: PRIVATE HEALTH INSURANCE | Admitting: Internal Medicine

## 2012-11-24 ENCOUNTER — Other Ambulatory Visit (INDEPENDENT_AMBULATORY_CARE_PROVIDER_SITE_OTHER): Payer: PRIVATE HEALTH INSURANCE

## 2012-11-24 VITALS — BP 102/70 | HR 78 | Temp 98.5°F | Ht 67.0 in | Wt 156.2 lb

## 2012-11-24 DIAGNOSIS — R10819 Abdominal tenderness, unspecified site: Secondary | ICD-10-CM

## 2012-11-24 DIAGNOSIS — M549 Dorsalgia, unspecified: Secondary | ICD-10-CM

## 2012-11-24 LAB — POCT URINALYSIS DIPSTICK
Protein, UA: NEGATIVE
Spec Grav, UA: 1.015
Urobilinogen, UA: NEGATIVE
pH, UA: 5

## 2012-11-24 MED ORDER — NAPROXEN 500 MG PO TABS: 500.0000 mg | ORAL_TABLET | Freq: Two times a day (BID) | ORAL | Status: DC

## 2012-11-24 NOTE — Progress Notes (Signed)
Subjective:    Patient ID: Janet [name], female    DOB: Jan 24, 1976, 37 y.o.   MRN: [id number]  HPI  Here to f/u, c/o bilat lower back pain for 1 month, mild to mod, intermittent, without change in severity, bowel or bladder change, fever, wt loss,  worsening LE pain/numbness/weakness, gait change or falls. Worse to bend or stand up and occasionally has some soreness to touch.  Did have a strong "ammonia" smell to urine recently though resolved and is concerned this may all be related to UTI. Denies urinary symptoms such as dysuria, frequency, urgency, flank pain, hematuria or n/v, fever, chills.  No other signficant PMH for spine dz, no recent falls or trauma.  Does also have mild lower abd tenderness, Denies worsening reflux,other abd pain, dysphagia, n/v, bowel change or blood. Incidentally finished 1 wk PCN oral for tooth related infection.   Past Medical History  Diagnosis Date  . HYPERTHYROIDISM 08/03/2006  . HYPERSOMNIA 04/10/2010   Past Surgical History  Procedure Laterality Date  . Cesarean section      x3  . Dilation and curettage, diagnostic / therapeutic      Hx of    reports that she has never smoked. She does not have any smokeless tobacco history on file. She reports that she does not drink alcohol or use illicit drugs. family history includes Cancer in her other and other; Hyperlipidemia in her mother; Hypertension in her mother; Lupus in her other; Sleep apnea in her mother; Thyroid disease in her mother. Allergies  Allergen Reactions  . Sulfa Antibiotics Shortness Of Breath and Swelling   Current Outpatient Prescriptions on File Prior to Visit  Medication Sig Dispense Refill  . diazepam (VALIUM) 5 MG tablet Take 1 tablet (5 mg total) by mouth every 6 (six) hours as needed for anxiety (spasms).  10 tablet  0  . methimazole (TAPAZOLE) 10 MG tablet Take 1 tablet (10 mg total) by mouth daily.  180 tablet  1  . traMADol (ULTRAM) 50 MG tablet Take 1 tablet (50 mg total) by  mouth every 6 (six) hours as needed for pain.  60 tablet  1   No current facility-administered medications on file prior to visit.   Review of Systems  Constitutional: Negative for unexpected weight change, or unusual diaphoresis  HENT: Negative for tinnitus.   Eyes: Negative for photophobia and visual disturbance.  Respiratory: Negative for choking and stridor.   Gastrointestinal: Negative for vomiting and blood in stool.  Genitourinary: Negative for hematuria and decreased urine volume.  Musculoskeletal: Negative for acute joint swelling Skin: Negative for color change and wound.  Neurological: Negative for tremors and numbness other than noted  Psychiatric/Behavioral: Negative for decreased concentration or  hyperactivity.       Objective:   Physical Exam BP 102/70  Pulse 78  Temp(Src) 98.5 F (36.9 C) (Oral)  Ht 5\' 7"  (1.702 m)  Wt 156 lb 4 oz (70.875 kg)  BMI 24.47 kg/m2  SpO2 96% VS noted, not ill appearing Constitutional: Pt appears well-developed and well-nourished.  HENT: Head: NCAT.  Right Ear: External ear normal.  Left Ear: External ear normal.  Eyes: Conjunctivae and EOM are normal. Pupils are equal, round, and reactive to light.  Neck: Normal range of motion. Neck supple.  Cardiovascular: Normal rate and regular rhythm.   Pulmonary/Chest: Effort normal and breath sounds normal.  Abd:  Soft, non-distended, + BS, mild tender low mid abd without guarding Neurological: Pt is alert. Not confused , motor  5/5 Skin: Skin is warm. No erythema. No LE edema Psychiatric: Pt behavior is normal. Thought content normal.     Assessment & Plan:

## 2012-11-24 NOTE — Patient Instructions (Signed)
Please take all new medication as prescribed - the anti-inflammatory for pain  The urine testing in the office is negative, but we will send the specimen for further evaluation If there is signs of infection, an antibiotic will be called to your pharmacy If there is no infection, please consider other evaluation such as blood lab testing, lumbar xrays and seeing your GYN

## 2012-11-25 DIAGNOSIS — R10819 Abdominal tenderness, unspecified site: Secondary | ICD-10-CM

## 2012-11-25 DIAGNOSIS — K5900 Constipation, unspecified: Secondary | ICD-10-CM | POA: Insufficient documentation

## 2012-11-25 DIAGNOSIS — M549 Dorsalgia, unspecified: Secondary | ICD-10-CM | POA: Insufficient documentation

## 2012-11-25 HISTORY — DX: Abdominal tenderness, unspecified site: R10.819

## 2012-11-25 HISTORY — DX: Dorsalgia, unspecified: M54.9

## 2012-11-25 LAB — URINALYSIS, ROUTINE W REFLEX MICROSCOPIC
Bilirubin Urine: NEGATIVE
Hgb urine dipstick: NEGATIVE
Ketones, ur: 15
Total Protein, Urine: NEGATIVE
pH: 6 (ref 5.0–8.0)

## 2012-11-25 NOTE — Assessment & Plan Note (Addendum)
UA dip neg and no GU symptoms, Suspect prob mechanical spinal issue related to strain or underlying mild DJD or DDD, I offered spine film to r/o spondylosis but pt declines for now,, ok for nsaid prn

## 2012-11-25 NOTE — Assessment & Plan Note (Addendum)
?   Clinical signficance, not clear if related to lower back pain, no GI or GU symtpoms, will check further UA and cx, but suspect should see GYN as well, r/o ovary cyst or other GYN, pt declines specific referral for this now as she is hoping for antibiotic for UTI

## 2012-11-26 ENCOUNTER — Other Ambulatory Visit: Payer: Self-pay | Admitting: Internal Medicine

## 2012-11-26 LAB — URINE CULTURE: Colony Count: >=100000

## 2012-11-26 MED ORDER — CEPHALEXIN 500 MG PO CAPS: 500.0000 mg | ORAL_CAPSULE | Freq: Four times a day (QID) | ORAL | Status: DC

## 2012-12-16 ENCOUNTER — Other Ambulatory Visit: Payer: Self-pay

## 2012-12-21 ENCOUNTER — Other Ambulatory Visit: Payer: Self-pay | Admitting: Internal Medicine

## 2013-01-28 ENCOUNTER — Ambulatory Visit (INDEPENDENT_AMBULATORY_CARE_PROVIDER_SITE_OTHER): Payer: PRIVATE HEALTH INSURANCE | Admitting: Endocrinology

## 2013-01-28 ENCOUNTER — Encounter: Payer: Self-pay | Admitting: Endocrinology

## 2013-01-28 VITALS — BP 118/74 | HR 82 | Temp 98.0°F | Ht 67.0 in | Wt 162.0 lb

## 2013-01-28 DIAGNOSIS — E0590 Thyrotoxicosis, unspecified without thyrotoxic crisis or storm: Secondary | ICD-10-CM

## 2013-01-28 DIAGNOSIS — D72819 Decreased white blood cell count, unspecified: Secondary | ICD-10-CM

## 2013-01-28 LAB — CBC WITH DIFFERENTIAL/PLATELET
Basophils Absolute: 0 10*3/uL (ref 0.0–0.1)
Eosinophils Absolute: 0.3 10*3/uL (ref 0.0–0.7)
Lymphocytes Relative: 28.1 % (ref 12.0–46.0)
MCHC: 33.6 g/dL (ref 30.0–36.0)
Monocytes Relative: 8.2 % (ref 3.0–12.0)
Neutro Abs: 2.7 10*3/uL (ref 1.4–7.7)
Neutrophils Relative %: 57.7 % (ref 43.0–77.0)
Platelets: 327 10*3/uL (ref 150.0–400.0)
RDW: 13.5 % (ref 11.5–14.6)

## 2013-01-28 LAB — TSH: TSH: 1.81 u[IU]/mL (ref 0.35–5.50)

## 2013-01-28 LAB — T4, FREE: Free T4: 0.86 ng/dL (ref 0.60–1.60)

## 2013-01-28 MED ORDER — AZITHROMYCIN 500 MG PO TABS: 500.0000 mg | ORAL_TABLET | Freq: Every day | ORAL | Status: DC

## 2013-01-28 NOTE — Progress Notes (Signed)
   Subjective:    Patient ID: Janet [name], female    DOB: [birth year], 37 y.o.   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  She has chosen to continue the methimazole indefinitely, as she cannot be isolated from her children. She has had tubal ligation.  She takes tapazole 10 mg qd.  Pt states few days of slight pain at the throat, and assoc nasal congestion.   Past Medical History  Diagnosis Date  . HYPERTHYROIDISM 08/03/2006  . HYPERSOMNIA 04/10/2010    Past Surgical History  Procedure Laterality Date  . Cesarean section      x3  . Dilation and curettage, diagnostic / therapeutic      Hx of    History   Social History  . Marital Status: Married    Spouse Name: N/A    Number of Children: 3  . Years of Education: N/A   Occupational History  . Not on file.   Social History Main Topics  . Smoking status: Never Smoker   . Smokeless tobacco: Not on file  . Alcohol Use: No  . Drug Use: No  . Sexual Activity:    Other Topics Concern  . 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 cephALEXin (KEFLEX) 500 MG capsule Take 1 capsule (500 mg total) by mouth 4 (four) times daily.  40 capsule  0  . diazepam (VALIUM) 5 MG tablet Take 1 tablet (5 mg total) by mouth every 6 (six) hours as needed for anxiety (spasms).  10 tablet  0  . naproxen (NAPROSYN) 500 MG tablet Take 1 tablet (500 mg total) by mouth 2 (two) times daily with a meal.  60 tablet  2  . traMADol (ULTRAM) 50 MG tablet Take 1 tablet (50 mg total) by mouth every 6 (six) hours as needed for pain.  60 tablet  1   No current facility-administered medications on file prior to visit.    Allergies  Allergen Reactions  . Sulfa Antibiotics Shortness Of Breath and Swelling    Family History  Problem Relation Age of Onset  . Hypertension  Mother   . Hyperlipidemia Mother   . Sleep apnea Mother   . Thyroid disease Mother   . Cancer Other     Aunt with Cancer- unknown type  . Lupus Other     Uncle  . Cancer Other     Lung Cancer-Uncle    BP 118/74  Pulse 82  Temp(Src) 98 F (36.7 C) (Oral)  Ht 5\' 7"  (1.702 m)  Wt 162 lb (73.483 kg)  BMI 25.37 kg/m2  SpO2 95%  Review of Systems Denies fever and weight change    Objective:   Physical Exam VITAL SIGNS:  See vs page GENERAL: no distress head: no deformity eyes: no periorbital swelling, no proptosis external nose and ears are normal mouth: no lesion seen Both tm's are very red NECK: thyroid is slightly and diffusely enlarged.  No thyroid nodule is palpable.  No palpable lymphadenopathy at the anterior neck.   Lab Results  Component Value Date   TSH 1.81 01/28/2013      Assessment & Plan:  URI, new Hyperthyroidism, well-controlled

## 2013-01-28 NOTE — Patient Instructions (Addendum)
blood tests are being requested for you today.  We'll contact you with results. if ever you have fever while taking methimazole, stop it and call us, because of the risk of a rare side-effect.   Please come back for a follow-up appointment in 6 months.   i have sent a prescription to your pharmacy, for an antibiotic. Loratadine-d (non-prescription) will help your congestion.  If your thyroid blood tests are way off, you should not take this.   [facility]

## 2013-02-07 ENCOUNTER — Emergency Department (INDEPENDENT_AMBULATORY_CARE_PROVIDER_SITE_OTHER)
Admission: EM | Admit: 2013-02-07 | Discharge: 2013-02-07 | Disposition: A | Discharge status: Home / Self Care | Payer: PRIVATE HEALTH INSURANCE | Source: Home / Self Care

## 2013-02-07 ENCOUNTER — Encounter (HOSPITAL_COMMUNITY): Payer: Self-pay | Admitting: Emergency Medicine

## 2013-02-07 DIAGNOSIS — S39012A Strain of muscle, fascia and tendon of lower back, initial encounter: Secondary | ICD-10-CM

## 2013-02-07 DIAGNOSIS — T148XXA Other injury of unspecified body region, initial encounter: Secondary | ICD-10-CM

## 2013-02-07 DIAGNOSIS — N390 Urinary tract infection, site not specified: Secondary | ICD-10-CM

## 2013-02-07 DIAGNOSIS — S335XXA Sprain of ligaments of lumbar spine, initial encounter: Secondary | ICD-10-CM

## 2013-02-07 LAB — POCT URINALYSIS DIP (DEVICE)
Bilirubin Urine: NEGATIVE
Glucose, UA: NEGATIVE mg/dL
Ketones, ur: NEGATIVE mg/dL
Leukocytes, UA: NEGATIVE
Protein, ur: NEGATIVE mg/dL
Specific Gravity, Urine: 1.02 (ref 1.005–1.030)

## 2013-02-07 LAB — POCT PREGNANCY, URINE: Preg Test, Ur: NEGATIVE

## 2013-02-07 MED ORDER — TRAMADOL HCL 50 MG PO TABS: 50.0000 mg | ORAL_TABLET | Freq: Four times a day (QID) | ORAL | Status: DC | PRN

## 2013-02-07 MED ORDER — CEPHALEXIN 250 MG PO CAPS: 250.0000 mg | ORAL_CAPSULE | Freq: Four times a day (QID) | ORAL | Status: DC

## 2013-02-07 MED ORDER — DICLOFENAC POTASSIUM 50 MG PO TABS: 50.0000 mg | ORAL_TABLET | Freq: Three times a day (TID) | ORAL | Status: DC

## 2013-02-07 NOTE — ED Notes (Signed)
Reports back pain, odor to urine .  Denies burning with urination, vaginal discharge prior to menstrual cycle

## 2013-02-07 NOTE — ED Provider Notes (Signed)
CSN: [id number]     Arrival date & time 02/07/13  1009 History   First MD Initiated Contact with Patient 02/07/13 1059     Chief Complaint  Patient presents with  . Back Pain   (Consider location/radiation/quality/duration/timing/severity/associated sxs/prior Treatment) HPI Comments: 37 year old female complaining of pain in the left back since yesterday. It is exacerbated and/or elicited with movements of the torso, rotation, bending getting up and down from a sitting position and other movement. She is unaware of any injury such as a fall. She does work as a Advertising copywriter but denies that this could be associated with the back pain but calls she has been doing for several years and lives should it happen at this time.  She has a malodorous urine for several days. Denies other urinary symptoms.   Past Medical History  Diagnosis Date  . HYPERTHYROIDISM 08/03/2006  . HYPERSOMNIA 04/10/2010   Past Surgical History  Procedure Laterality Date  . Cesarean section      x3  . Dilation and curettage, diagnostic / therapeutic      Hx of   Family History  Problem Relation Age of Onset  . Hypertension Mother   . Hyperlipidemia Mother   . Sleep apnea Mother   . Thyroid disease Mother   . Cancer Other     Aunt with Cancer- unknown type  . Lupus Other     Uncle  . Cancer Other     Lung Cancer-Uncle   History  Substance Use Topics  . Smoking status: Never Smoker   . Smokeless tobacco: Not on file  . Alcohol Use: No   OB History   Grav Para Term Preterm Abortions TAB SAB Ect Mult Living                 Review of Systems  Constitutional: Negative for fever, chills and activity change.  HENT: Negative.   Respiratory: Negative.   Cardiovascular: Negative.   Gastrointestinal: Negative.   Genitourinary: Negative for dysuria, urgency, frequency, hematuria, flank pain, vaginal bleeding, difficulty urinating and pelvic pain.  Musculoskeletal: Positive for back pain. Negative for neck  pain.       As per HPI  Skin: Negative for color change, pallor and rash.  Neurological: Negative.     Allergies  Sulfa antibiotics  Home Medications   Current Outpatient Rx  Name  Route  Sig  Dispense  Refill  . azithromycin (ZITHROMAX) 500 MG tablet   Oral   Take 1 tablet (500 mg total) by mouth daily.   5 tablet   0   . cephALEXin (KEFLEX) 250 MG capsule   Oral   Take 1 capsule (250 mg total) by mouth 4 (four) times daily.   28 capsule   0   . cephALEXin (KEFLEX) 500 MG capsule   Oral   Take 1 capsule (500 mg total) by mouth 4 (four) times daily.   40 capsule   0   . diazepam (VALIUM) 5 MG tablet   Oral   Take 1 tablet (5 mg total) by mouth every 6 (six) hours as needed for anxiety (spasms).   10 tablet   0   . diclofenac (CATAFLAM) 50 MG tablet   Oral   Take 1 tablet (50 mg total) by mouth 3 (three) times daily.   21 tablet   0   . methimazole (TAPAZOLE) 10 MG tablet   Oral   Take 10 mg by mouth daily.          Marland Kitchen  naproxen (NAPROSYN) 500 MG tablet   Oral   Take 1 tablet (500 mg total) by mouth 2 (two) times daily with a meal.   60 tablet   2   . traMADol (ULTRAM) 50 MG tablet   Oral   Take 1 tablet (50 mg total) by mouth every 6 (six) hours as needed for pain.   60 tablet   1   . traMADol (ULTRAM) 50 MG tablet   Oral   Take 1 tablet (50 mg total) by mouth every 6 (six) hours as needed.   15 tablet   0    BP 117/70  Pulse 78  Temp(Src) 98.7 F (37.1 C) (Oral)  Resp 16  SpO2 100%  LMP 02/03/2013 Physical Exam  Nursing note and vitals reviewed. Constitutional: She is oriented to person, place, and time. She appears well-developed and well-nourished. No distress.  HENT:  Head: Normocephalic and atraumatic.  Eyes: EOM are normal.  Neck: Normal range of motion. Neck supple.  Cardiovascular: Normal rate.   Pulmonary/Chest: Effort normal. No respiratory distress.  Musculoskeletal:  Tenderness to the right paralumbar musculature with  tenderness to the right lateral muscles over the iliac crest. Pain is elicited with getting up from a chair and onto the table and getting off the table. No CVA tenderness. No thoracic or parathoracic pain or tenderness.  Lymphadenopathy:    She has no cervical adenopathy.  Neurological: She is alert and oriented to person, place, and time. No cranial nerve deficit.  Skin: Skin is warm and dry.  Psychiatric: She has a normal mood and affect.    ED Course  Procedures (including critical care time) Labs Review Labs Reviewed  POCT URINALYSIS DIP (DEVICE) - Abnormal; Notable for the following:    Hgb urine dipstick TRACE (*)    Nitrite POSITIVE (*)    All other components within normal limits  URINE CULTURE  POCT PREGNANCY, URINE   Imaging Review No results found.    MDM   1. Strain of lumbar paraspinal muscle, initial encounter   2. UTI (lower urinary tract infection)   3. Muscle strain       For lumbar muscle pain apply heat to the area discomfort. Avoid those movements that make it worse. Tonight if needed and stay in bed. Cataflam 3 times a day when necessary pain Term in all as directed for pain The instructions regarding muscle strain Take Keflex as directed urine culture ordered Drink plenty of fluids stay well hydrated Follow up with your PCP as needed.     Hayden Rasmussen, NP 02/07/13 1128

## 2013-02-08 NOTE — ED Provider Notes (Signed)
Medical screening examination/treatment/procedure(s) were performed by non-physician practitioner and as supervising physician I was immediately available for consultation/collaboration.  Natosha Bou, M.D.  Zyhir Cappella C Shandy Checo, MD 02/08/13 0025 

## 2013-02-09 LAB — URINE CULTURE

## 2013-02-09 NOTE — ED Notes (Signed)
Urine culture: >100,000 colonies E. Coli.  Pt. adequately treated with Keflex. Vassie Moselle 02/09/2013

## 2013-04-18 ENCOUNTER — Other Ambulatory Visit: Payer: Self-pay

## 2013-04-18 MED ORDER — METHIMAZOLE 10 MG PO TABS
10.0000 mg | ORAL_TABLET | Freq: Every day | ORAL | Status: DC
Start: 2013-04-18 — End: 2013-06-24

## 2013-04-19 ENCOUNTER — Other Ambulatory Visit (INDEPENDENT_AMBULATORY_CARE_PROVIDER_SITE_OTHER): Payer: PRIVATE HEALTH INSURANCE

## 2013-04-19 ENCOUNTER — Encounter: Payer: Self-pay | Admitting: Internal Medicine

## 2013-04-19 ENCOUNTER — Ambulatory Visit (INDEPENDENT_AMBULATORY_CARE_PROVIDER_SITE_OTHER): Payer: PRIVATE HEALTH INSURANCE | Admitting: Internal Medicine

## 2013-04-19 VITALS — BP 110/72 | HR 84 | Temp 98.0°F | Ht 67.0 in | Wt 166.5 lb

## 2013-04-19 DIAGNOSIS — Z0289 Encounter for other administrative examinations: Secondary | ICD-10-CM

## 2013-04-19 DIAGNOSIS — Z021 Encounter for pre-employment examination: Secondary | ICD-10-CM | POA: Insufficient documentation

## 2013-04-19 DIAGNOSIS — Z0000 Encounter for general adult medical examination without abnormal findings: Secondary | ICD-10-CM

## 2013-04-19 DIAGNOSIS — Z23 Encounter for immunization: Secondary | ICD-10-CM

## 2013-04-19 LAB — BASIC METABOLIC PANEL
BUN: 13 mg/dL (ref 6–23)
CO2: 27 mEq/L (ref 19–32)
CREATININE: 0.7 mg/dL (ref 0.4–1.2)
Calcium: 9 mg/dL (ref 8.4–10.5)
Chloride: 102 mEq/L (ref 96–112)
GFR: 132.59 mL/min (ref >60.00)
Glucose, Bld: 77 mg/dL (ref 70–99)
POTASSIUM: 3.8 meq/L (ref 3.5–5.1)
Sodium: 135 mEq/L (ref 135–145)

## 2013-04-19 LAB — LIPID PANEL
Cholesterol: 170 mg/dL (ref 0–200)
HDL: 76.3 mg/dL (ref >39.00)
LDL CALC: 87 mg/dL (ref 0–99)
Total CHOL/HDL Ratio: 2
Triglycerides: 34 mg/dL (ref 0.0–149.0)
VLDL: 6.8 mg/dL (ref 0.0–40.0)

## 2013-04-19 LAB — CBC WITH DIFFERENTIAL/PLATELET
BASOS PCT: 0.5 % (ref 0.0–3.0)
Basophils Absolute: 0 10*3/uL (ref 0.0–0.1)
EOS PCT: 4.4 % (ref 0.0–5.0)
Eosinophils Absolute: 0.3 10*3/uL (ref 0.0–0.7)
HEMATOCRIT: 38.7 % (ref 36.0–46.0)
Hemoglobin: 13 g/dL (ref 12.0–15.0)
LYMPHS ABS: 1.8 10*3/uL (ref 0.7–4.0)
Lymphocytes Relative: 30.1 % (ref 12.0–46.0)
MCHC: 33.7 g/dL (ref 30.0–36.0)
MCV: 87 fl (ref 78.0–100.0)
Monocytes Absolute: 0.4 10*3/uL (ref 0.1–1.0)
Monocytes Relative: 7.4 % (ref 3.0–12.0)
NEUTROS ABS: 3.4 10*3/uL (ref 1.4–7.7)
NEUTROS PCT: 57.6 % (ref 43.0–77.0)
Platelets: 328 10*3/uL (ref 150.0–400.0)
RBC: 4.45 Mil/uL (ref 3.87–5.11)
RDW: 13.3 % (ref 11.5–14.6)
WBC: 5.9 10*3/uL (ref 4.5–10.5)

## 2013-04-19 LAB — HEPATIC FUNCTION PANEL
ALT: 18 U/L (ref 0–35)
AST: 19 U/L (ref 0–37)
Albumin: 4.2 g/dL (ref 3.5–5.2)
Alkaline Phosphatase: 61 U/L (ref 39–117)
Bilirubin, Direct: 0.1 mg/dL (ref 0.0–0.3)
Total Bilirubin: 0.7 mg/dL (ref 0.3–1.2)
Total Protein: 7.7 g/dL (ref 6.0–8.3)

## 2013-04-19 LAB — URINALYSIS, ROUTINE W REFLEX MICROSCOPIC
Bilirubin Urine: NEGATIVE
HGB URINE DIPSTICK: NEGATIVE
KETONES UR: 40 — AB
Leukocytes, UA: NEGATIVE
NITRITE: NEGATIVE
PH: 6 (ref 5.0–8.0)
RBC / HPF: NONE SEEN (ref 0–?)
Specific Gravity, Urine: >=1.03 — AB (ref 1.000–1.030)
Total Protein, Urine: NEGATIVE
URINE GLUCOSE: NEGATIVE
Urobilinogen, UA: 0.2 (ref 0.0–1.0)

## 2013-04-19 LAB — TSH: TSH: 1.05 u[IU]/mL (ref 0.35–5.50)

## 2013-04-19 NOTE — Progress Notes (Signed)
Pre visit review using our clinic review tool, if applicable. No additional management support is needed unless otherwise documented below in the visit note. 

## 2013-04-19 NOTE — Addendum Note (Signed)
Addended by: Scharlene GlossEWING, Jie Stickels B on: 04/19/2013 04:31 PM   Modules accepted: Orders

## 2013-04-19 NOTE — Progress Notes (Signed)
Subjective:    Patient ID: Janet [name], female    DOB: 06-12-1975, 38 y.o.   MRN: [id number]  HPI Here for wellness and f/u;  Overall doing ok;  Pt denies CP, worsening SOB, DOE, wheezing, orthopnea, PND, worsening LE edema, palpitations, dizziness or syncope.  Pt denies neurological change such as new headache, facial or extremity weakness.  Pt denies polydipsia, polyuria, or low sugar symptoms. Pt states overall good compliance with treatment and medications, good tolerability, and has been trying to follow lower cholesterol diet.  Pt denies worsening depressive symptoms, suicidal ideation or panic. No fever, night sweats, wt loss, loss of appetite, or other constitutional symptoms.  Pt states good ability with ADL's, has low fall risk, home safety reviewed and adequate, no other significant changes in hearing or vision, and only occasionally active with exercise. 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  Needs immunizations, no acute complaints Past Medical History  Diagnosis Date  . HYPERTHYROIDISM 08/03/2006  . HYPERSOMNIA 04/10/2010   Past Surgical History  Procedure Laterality Date  . Cesarean section      x3  . Dilation and curettage, diagnostic / therapeutic      Hx of    reports that she has never smoked. She does not have any smokeless tobacco history on file. She reports that she does not drink alcohol or use illicit drugs. family history includes Cancer in her other and other; Hyperlipidemia in her mother; Hypertension in her mother; Lupus in her other; Sleep apnea in her mother; Thyroid disease in her mother. Allergies  Allergen Reactions  . Sulfa Antibiotics Shortness Of Breath and Swelling   Current Outpatient Prescriptions on File Prior to Visit  Medication Sig Dispense Refill  . methimazole (TAPAZOLE) 10 MG tablet Take 1 tablet (10 mg total) by mouth daily.  30 tablet  0   No current facility-administered medications on file prior to visit.    Review of  Systems Constitutional: Negative for diaphoresis, activity change, appetite change or unexpected weight change.  HENT: Negative for hearing loss, ear pain, facial swelling, mouth sores and neck stiffness.   Eyes: Negative for pain, redness and visual disturbance.  Respiratory: Negative for shortness of breath and wheezing.   Cardiovascular: Negative for chest pain and palpitations.  Gastrointestinal: Negative for diarrhea, blood in stool, abdominal distention or other pain Genitourinary: Negative for hematuria, flank pain or change in urine volume.  Musculoskeletal: Negative for myalgias and joint swelling.  Skin: Negative for color change and wound.  Neurological: Negative for syncope and numbness. other than noted Hematological: Negative for adenopathy.  Psychiatric/Behavioral: Negative for hallucinations, self-injury, decreased concentration and agitation.      Objective:   Physical Exam BP 110/72  Pulse 84  Temp(Src) 98 F (36.7 C) (Oral)  Ht 5\' 7"  (1.702 m)  Wt 166 lb 8 oz (75.524 kg)  BMI 26.07 kg/m2  SpO2 98% VS noted,  Constitutional: Pt is oriented to person, place, and time. Appears well-developed and well-nourished.  Head: Normocephalic and atraumatic.  Right Ear: External ear normal.  Left Ear: External ear normal.  Nose: Nose normal.  Mouth/Throat: Oropharynx is clear and moist.  Eyes: Conjunctivae and EOM are normal. Pupils are equal, round, and reactive to light.  Neck: Normal range of motion. Neck supple. No JVD present. No tracheal deviation present.  Cardiovascular: Normal rate, regular rhythm, normal heart sounds and intact distal pulses.   Pulmonary/Chest: Effort normal and breath sounds normal.  Abdominal: Soft. Bowel sounds are normal.  There is no tenderness. No HSM  Musculoskeletal: Normal range of motion. Exhibits no edema.  Lymphadenopathy:  Has no cervical adenopathy.  Neurological: Pt is alert and oriented to person, place, and time. Pt has normal  reflexes. No cranial nerve deficit.  Skin: Skin is warm and dry. No rash noted.  Psychiatric:  Has  normal mood and affect. Behavior is normal.     Assessment & Plan:

## 2013-04-19 NOTE — Patient Instructions (Signed)
You had the Tetanus (Tdap) and Hepatitis B shot #1 given today  Please return for Nurse visit in 1 month for Hepatitis B shot #2, then in 6 months (july 2015) for Hepatitis B shot #3  Please continue all other medications as before, and refills have been done if requested. Please have the pharmacy call with any other refills you may need.  Please continue your efforts at being more active, low cholesterol diet, and weight control. You are otherwise up to date with prevention measures today.  Please go to the LAB in the Basement (turn left off the elevator) for the tests to be done today, including the varicella, MMR and TB blood testing You will be contacted by phone if any changes need to be made immediately.  Otherwise, you will receive a letter about your results with an explanation, but please check with MyChart first.  After you blood is drawn, please return to the [facility] to pick up your Form

## 2013-04-19 NOTE — Assessment & Plan Note (Signed)

## 2013-05-16 ENCOUNTER — Other Ambulatory Visit: Payer: Self-pay | Admitting: *Deleted

## 2013-05-16 DIAGNOSIS — Z021 Encounter for pre-employment examination: Secondary | ICD-10-CM

## 2013-05-20 ENCOUNTER — Ambulatory Visit (INDEPENDENT_AMBULATORY_CARE_PROVIDER_SITE_OTHER): Payer: PRIVATE HEALTH INSURANCE

## 2013-05-20 ENCOUNTER — Other Ambulatory Visit: Payer: PRIVATE HEALTH INSURANCE

## 2013-05-20 DIAGNOSIS — Z021 Encounter for pre-employment examination: Secondary | ICD-10-CM

## 2013-05-20 DIAGNOSIS — Z23 Encounter for immunization: Secondary | ICD-10-CM

## 2013-05-21 LAB — VARICELLA ZOSTER ANTIBODY, IGG: Varicella IgG: 2658 Index — ABNORMAL HIGH (ref <135.00)

## 2013-05-21 LAB — MEASLES/MUMPS/RUBELLA IMMUNITY
MUMPS IGG: 76 [AU]/ml — AB (ref <9.00)
Rubella: 2.28 Index — ABNORMAL HIGH (ref <0.90)
Rubeola IgG: 94.3 AU/mL — ABNORMAL HIGH (ref <25.00)

## 2013-05-24 LAB — QUANTIFERON TB GOLD ASSAY (BLOOD)
MITOGEN VALUE: 0.04 [IU]/mL
Quantiferon Nil Value: 0.04 IU/mL
Quantiferon Tb Ag Minus Nil Value: 0 IU/mL
TB AG VALUE: 0.02 [IU]/mL

## 2013-06-07 ENCOUNTER — Ambulatory Visit (INDEPENDENT_AMBULATORY_CARE_PROVIDER_SITE_OTHER): Payer: PRIVATE HEALTH INSURANCE | Admitting: *Deleted

## 2013-06-07 DIAGNOSIS — Z0289 Encounter for other administrative examinations: Secondary | ICD-10-CM

## 2013-06-07 DIAGNOSIS — Z021 Encounter for pre-employment examination: Secondary | ICD-10-CM

## 2013-06-09 ENCOUNTER — Encounter: Payer: Self-pay | Admitting: Obstetrics and Gynecology

## 2013-06-09 ENCOUNTER — Ambulatory Visit (INDEPENDENT_AMBULATORY_CARE_PROVIDER_SITE_OTHER): Payer: PRIVATE HEALTH INSURANCE | Admitting: Obstetrics and Gynecology

## 2013-06-09 VITALS — BP 110/74 | HR 80 | Resp 16 | Ht 66.0 in | Wt 166.6 lb

## 2013-06-09 DIAGNOSIS — R300 Dysuria: Secondary | ICD-10-CM

## 2013-06-09 DIAGNOSIS — N920 Excessive and frequent menstruation with regular cycle: Secondary | ICD-10-CM

## 2013-06-09 DIAGNOSIS — M545 Low back pain, unspecified: Secondary | ICD-10-CM

## 2013-06-09 DIAGNOSIS — N921 Excessive and frequent menstruation with irregular cycle: Secondary | ICD-10-CM

## 2013-06-09 DIAGNOSIS — Z01419 Encounter for gynecological examination (general) (routine) without abnormal findings: Secondary | ICD-10-CM

## 2013-06-09 DIAGNOSIS — Z87440 Personal history of urinary (tract) infections: Secondary | ICD-10-CM

## 2013-06-09 DIAGNOSIS — Z0000 Encounter for general adult medical examination without abnormal findings: Secondary | ICD-10-CM

## 2013-06-09 LAB — POCT URINALYSIS DIPSTICK
Bilirubin, UA: NEGATIVE
GLUCOSE UA: NEGATIVE
Ketones, UA: NEGATIVE
Leukocytes, UA: NEGATIVE
Nitrite, UA: NEGATIVE
Protein, UA: NEGATIVE
RBC UA: NEGATIVE
UROBILINOGEN UA: NEGATIVE
pH, UA: 5

## 2013-06-09 LAB — TB SKIN TEST
Induration: 0 mm
TB SKIN TEST: NEGATIVE

## 2013-06-09 MED ORDER — NITROFURANTOIN MONOHYD MACRO 100 MG PO CAPS: ORAL_CAPSULE | ORAL | Status: DC

## 2013-06-09 NOTE — Patient Instructions (Signed)

## 2013-06-09 NOTE — Progress Notes (Signed)
Patient ID: Janet Lemoniffany A [name], female   DOB: [birth year], 38 y.o.   MRN: [id number] GYNECOLOGY VISIT  PCP:   Oliver BarreJames John, MD  Referring provider:   HPI: 38 y.o.   Married  [place]   [phone] with Patient's last menstrual period was 06/05/2013.   here for AEX.   History of fibroids.  Painful menses.  Bleeds for 5 days, stops and then starts again.  Lasts for a total of 2 - 3 weeks.  Soaks through "everything." HGB and TSH normal on 04/19/13 with PCP.   Recurrent UTIs after intercourse.  Has had 3 UTIs so far this year.  Some low back pain.  Has used Ciprofloxacin in the past.   Hgb:    PCP Urine:  Neg  GYNECOLOGIC HISTORY: Patient's last menstrual period was 06/05/2013. Sexually active:  yes Partner preference: female Contraception:   Tubal ligation Menopausal hormone therapy: n/a DES exposure:   no Blood transfusions: no   Sexually transmitted diseases:   no GYN procedures and prior surgeries:  D & C for miscarriage, C-section x3 Last mammogram: n/a              Last pap and high risk HPV testing:   2011 wnl History of abnormal pap smear:  no   OB History   Grav Para Term Preterm Abortions TAB SAB Ect Mult Living   5 3 3  2  2   3        LIFESTYLE: Exercise:   no            Tobacco:   no Alcohol:      no Drug use:   no  OTHER HEALTH MAINTENANCE: Tetanus/TDap:  04/2013 Gardisil:              n/a Influenza:            never Zostavax:            n/a  Bone density:      n/a Colonoscopy:      n/a  Cholesterol check:   04/2013 wnl with PCP  Family History  Problem Relation Age of Onset  . Hypertension Mother   . Hyperlipidemia Mother   . Sleep apnea Mother   . Thyroid disease Mother   . Stroke Mother   . Cancer Other     Aunt with Cancer- unknown type  . Lupus Other     Uncle  . Cancer Other     Lung Cancer-Uncle    Patient Active Problem List   Diagnosis Date Noted  . Pre-employment examination 04/19/2013  . Back pain 11/25/2012  .  Abdominal tenderness 11/25/2012  . Dyspnea 04/25/2011  . Palpitations 04/25/2011  . Paresthesia 12/30/2010  . Preventative health care 12/30/2010  . Right carpal tunnel syndrome 12/30/2010  . HYPERSOMNIA 04/10/2010  . Cough 03/05/2009  . LEUKOPENIA, MILD 09/21/2008  . FATIGUE 01/19/2008  . HYPERTHYROIDISM 08/03/2006   Past Medical History  Diagnosis Date  . HYPERTHYROIDISM 08/03/2006  . HYPERSOMNIA 04/10/2010    Past Surgical History  Procedure Laterality Date  . Cesarean section      x3  . Dilation and curettage of uterus      ALLERGIES: Sulfa antibiotics  Current Outpatient Prescriptions  Medication Sig Dispense Refill  . methimazole (TAPAZOLE) 10 MG tablet Take 1 tablet (10 mg total) by mouth daily.  30 tablet  0   No current facility-administered medications for this visit.     ROS:  Pertinent items  are noted in HPI.  SOCIAL HISTORY:  Married. Housekeeping.   PHYSICAL EXAMINATION:    BP 110/74  Pulse 80  Resp 16  Ht 5\' 6"  (1.676 m)  Wt 166 lb 9.6 oz (75.569 kg)  BMI 26.90 kg/m2  LMP 06/05/2013   Wt Readings from Last 3 Encounters:  06/09/13 166 lb 9.6 oz (75.569 kg)  04/19/13 166 lb 8 oz (75.524 kg)  01/28/13 162 lb (73.483 kg)     Ht Readings from Last 3 Encounters:  06/09/13 5\' 6"  (1.676 m)  04/19/13 5\' 7"  (1.702 m)  01/28/13 5\' 7"  (1.702 m)    General appearance: alert, cooperative and appears stated age Head: Normocephalic, without obvious abnormality, atraumatic Neck: no adenopathy, supple, symmetrical, trachea midline and thyroid not enlarged, symmetric, no tenderness/mass/nodules Lungs: clear to auscultation bilaterally Breasts: Inspection negative, No nipple retraction or dimpling, No nipple discharge or bleeding, No axillary or supraclavicular adenopathy, Normal to palpation without dominant masses Heart: regular rate and rhythm Abdomen: soft, non-tender; no masses,  no organomegaly Extremities: extremities normal, atraumatic, no cyanosis or  edema Skin: Skin color, texture, turgor normal. No rashes or lesions Lymph nodes: Cervical, supraclavicular, and axillary nodes normal. No abnormal inguinal nodes palpated Neurologic: Grossly normal  Pelvic: External genitalia:  no lesions              Urethra:  normal appearing urethra with no masses, tenderness or lesions              Bartholins and Skenes: normal                 Vagina: normal appearing vagina with normal color and discharge, no lesions, brownish blood.               Cervix: normal appearance              Pap and high risk HPV testing done: yes.            Bimanual Exam:  Uterus:  uterus is normal size, shape, consistency and nontender                                      Adnexa: normal adnexa in size, nontender and no masses                                      Rectovaginal: Confirms                                      Anus:  normal sphincter tone, no lesions  ASSESSMENT  Normal gynecologic exam. History of cesarean section x 3.  Status post BTL. Menometrorrhagia. Dysmenorrhea. Recurrent UTIs, post coital. Back pain - low.  Rule out UTI today.   PLAN  Mammogram recommended yearly at age 38.  Pap smear and high risk HPV testing Counseled on self breast exam. Return for pelvic ultrasound and further consultation.  Urine culture today.  Rx for Nitrofurantoin 100 mg po with intercourse.  #30, RF 2. If infections persist, to Urology.   Return annually or prn   An After Visit Summary was printed and given to the patient.

## 2013-06-12 LAB — CULTURE, URINE COMPREHENSIVE

## 2013-06-13 ENCOUNTER — Other Ambulatory Visit: Payer: Self-pay | Admitting: Obstetrics and Gynecology

## 2013-06-13 ENCOUNTER — Telehealth: Payer: Self-pay | Admitting: Obstetrics and Gynecology

## 2013-06-13 DIAGNOSIS — N390 Urinary tract infection, site not specified: Secondary | ICD-10-CM

## 2013-06-13 NOTE — Telephone Encounter (Signed)
patient wants to hold off on this PUS. Her hours have been cut because she is in school. She will discuss further with Dr Edward JollySilva.

## 2013-06-14 LAB — IPS PAP TEST WITH HPV

## 2013-06-15 NOTE — Telephone Encounter (Signed)
Message copied by Joeseph AmorFAST, Julieanna Geraci L on Wed Jun 15, 2013  1:27 PM ------      Message from: Ricki MillerAMUNDSON DE Gwenevere GhaziARVALHO E SILVA, BROOK E      Created: Tue Jun 14, 2013  8:32 AM      Regarding: I recommend a follow up visit to discuss options       Hi French Anaracy,            Patient has menometrorrhagia and dysmenorrhea and declines ultrasound.            I recommend a follow up visit to discuss options of how I can help her.  Please contact the patient to offer this.             Thanks,            Conley SimmondsBrook Silva, MD      ----- Message -----         From: Vangie BickerSabrina S Franklin         Sent: 06/13/2013   2:44 PM           To: Brook E Amundson de Gwenevere Ghaziarvalho E Silva, MD      Subject: FYI                                                      Patient wants to hold off on this PUS due to finances.             Thank you,      Martie LeeSabrina       ------

## 2013-06-15 NOTE — Telephone Encounter (Signed)
Spoke with patient. She is agreeable to office visit with Dr. Edward JollySilva to discuss options. She is also scheduled for urine test of cure. She would like to have only one appointment so that there is only one office visit copay. She will start Macrobid tomorrow as she has not be able to pick up prescription so she will be early for test of cure. Advised that she can discuss this as well with Dr. Edward JollySilva at time of appointment, possibly can come back for urine drop off at lab only after 10-14 days. Advised patient to ensure that she take rx as soon as she can. She denies complaints. Patient scheduled for office visit with Dr. Edward JollySilva on 06/24/13 at 1230.

## 2013-06-15 NOTE — Telephone Encounter (Signed)
Thank you for contacting the patient and reinforcing starting the antibiotics for the UTI. I will be happy to see her on 5/1/5.

## 2013-06-24 ENCOUNTER — Ambulatory Visit: Payer: PRIVATE HEALTH INSURANCE

## 2013-06-24 ENCOUNTER — Ambulatory Visit: Payer: PRIVATE HEALTH INSURANCE | Admitting: Obstetrics and Gynecology

## 2013-06-24 ENCOUNTER — Telehealth: Payer: Self-pay | Admitting: Endocrinology

## 2013-06-24 MED ORDER — METHIMAZOLE 10 MG PO TABS: 10.0000 mg | ORAL_TABLET | Freq: Every day | ORAL | Status: DC

## 2013-06-24 NOTE — Telephone Encounter (Signed)
Pt informed

## 2013-06-24 NOTE — Telephone Encounter (Signed)
Could we call in a bridge until her appt on 6/5 for the methimazole. Please call pt with status.

## 2013-07-15 ENCOUNTER — Ambulatory Visit: Payer: PRIVATE HEALTH INSURANCE | Admitting: Endocrinology

## 2013-07-22 ENCOUNTER — Encounter: Payer: Self-pay | Admitting: Obstetrics and Gynecology

## 2013-07-22 ENCOUNTER — Ambulatory Visit (INDEPENDENT_AMBULATORY_CARE_PROVIDER_SITE_OTHER): Payer: PRIVATE HEALTH INSURANCE | Admitting: Obstetrics and Gynecology

## 2013-07-22 VITALS — BP 90/62 | HR 76 | Ht 66.0 in | Wt 167.0 lb

## 2013-07-22 DIAGNOSIS — N921 Excessive and frequent menstruation with irregular cycle: Secondary | ICD-10-CM

## 2013-07-22 DIAGNOSIS — M545 Low back pain, unspecified: Secondary | ICD-10-CM

## 2013-07-22 DIAGNOSIS — N920 Excessive and frequent menstruation with regular cycle: Secondary | ICD-10-CM

## 2013-07-22 DIAGNOSIS — R300 Dysuria: Secondary | ICD-10-CM

## 2013-07-22 LAB — POCT URINALYSIS DIPSTICK
BILIRUBIN UA: NEGATIVE
Glucose, UA: NEGATIVE
Nitrite, UA: NEGATIVE
RBC UA: NEGATIVE
Urobilinogen, UA: NEGATIVE
pH, UA: 5

## 2013-07-22 MED ORDER — NORETHIN ACE-ETH ESTRAD-FE 1-20 MG-MCG PO TABS: 1.0000 | ORAL_TABLET | Freq: Every day | ORAL | Status: DC

## 2013-07-22 MED ORDER — NORETHINDRONE 0.35 MG PO TABS: 1.0000 | ORAL_TABLET | Freq: Every day | ORAL | Status: DC

## 2013-07-22 NOTE — Patient Instructions (Signed)
Norethindrone tablets (contraception) What is this medicine? NORETHINDRONE (nor eth IN drone) is an oral contraceptive. The product contains a female hormone known as a progestin. It is used to prevent pregnancy. This medicine may be used for other purposes; ask your health care provider or pharmacist if you have questions. COMMON BRAND NAME(S): Camila, Errin , Heather, Jencycla, Jolivette , Lyza, Nor-QD, Nora-BE, Ortho Micronor What should I tell my health care provider before I take this medicine? They need to know if you have any of these conditions: -blood vessel disease or blood clots -breast, cervical, or vaginal cancer -diabetes -heart disease -kidney disease -liver disease -mental depression -migraine -seizures -stroke -vaginal bleeding -an unusual or allergic reaction to norethindrone, other medicines, foods, dyes, or preservatives -pregnant or trying to get pregnant -breast-feeding How should I use this medicine? Take this medicine by mouth with a glass of water. You may take it with or without food. Follow the directions on the prescription label. Take this medicine at the same time each day and in the order directed on the package. Do not take your medicine more often than directed. Contact your pediatrician regarding the use of this medicine in children. Special care may be needed. This medicine has been used in female children who have started having menstrual periods. A patient package insert for the product will be given with each prescription and refill. Read this sheet carefully each time. The sheet may change frequently. Overdosage: If you think you have taken too much of this medicine contact a poison control center or emergency room at once. NOTE: This medicine is only for you. Do not share this medicine with others. What if I miss a dose? Try not to miss a dose. Every time you miss a dose or take a dose late your chance of pregnancy increases. When 1 pill is missed  (even if only 3 hours late), take the missed pill as soon as possible and continue taking a pill each day at the regular time (use a back up method of birth control for the next 48 hours). If more than 1 dose is missed, use an additional birth control method for the rest of your pill pack until menses occurs. Contact your health care professional if more than 1 dose has been missed. What may interact with this medicine? Do not take this medicine with any of the following medications: -amprenavir or fosamprenavir -bosentan This medicine may also interact with the following medications: -antibiotics or medicines for infections, especially rifampin, rifabutin, rifapentine, and griseofulvin, and possibly penicillins or tetracyclines -aprepitant -barbiturate medicines, such as phenobarbital -carbamazepine -felbamate -modafinil -oxcarbazepine -phenytoin -ritonavir or other medicines for HIV infection or AIDS -St. John's wort -topiramate This list may not describe all possible interactions. Give your health care provider a list of all the medicines, herbs, non-prescription drugs, or dietary supplements you use. Also tell them if you smoke, drink alcohol, or use illegal drugs. Some items may interact with your medicine. What should I watch for while using this medicine? Visit your doctor or health care professional for regular checks on your progress. You will need a regular breast and pelvic exam and Pap smear while on this medicine. Use an additional method of birth control during the first cycle that you take these tablets. If you have any reason to think you are pregnant, stop taking this medicine right away and contact your doctor or health care professional. If you are taking this medicine for hormone related problems, it may take several   cycles of use to see improvement in your condition. This medicine does not protect you against HIV infection (AIDS) or any other sexually transmitted  diseases. What side effects may I notice from receiving this medicine? Side effects that you should report to your doctor or health care professional as soon as possible: -breast tenderness or discharge -pain in the abdomen, chest, groin or leg -severe headache -skin rash, itching, or hives -sudden shortness of breath -unusually weak or tired -vision or speech problems -yellowing of skin or eyes Side effects that usually do not require medical attention (report to your doctor or health care professional if they continue or are bothersome): -changes in sexual desire -change in menstrual flow -facial hair growth -fluid retention and swelling -headache -irritability -nausea -weight gain or loss This list may not describe all possible side effects. Call your doctor for medical advice about side effects. You may report side effects to FDA at 1-800-FDA-1088. Where should I keep my medicine? Keep out of the reach of children. Store at room temperature between 15 and 30 degrees C (59 and 86 degrees F). Throw away any unused medicine after the expiration date. NOTE: This sheet is a summary. It may not cover all possible information. If you have questions about this medicine, talk to your doctor, pharmacist, or health care provider.  2014, Elsevier/Gold Standard. (2011-10-17 16:41:35)  

## 2013-07-22 NOTE — Progress Notes (Signed)
GYNECOLOGY  VISIT   HPI: 38 y.o.   M38 y.o. Married [place]  [phone] with Patient's last menstrual period was 07/06/2013.  here for discussion of treatment of abnormal bleeding and possible UTI.  Patient having pain in abdomen with sneezing.  Bleeds for 3 weeks total including all of the spotting.  History of fibroids.  Just finished cycle.  HGB and TSH normal on 04/19/13 with PCP.  Declines ultrasound due to cost.   Used birth control pills in the past without problems.  No migraine headaches.  No history of blood clots.  Denies cardiac history or respiratory history.   Having some discomfort still with urination. Urine culture 06/09/13 - E Coli with > 100,000 colonies/ml. Only took Macrobid once daily for one week.  Did not understand dosage correctly. Under my direction she was called by our office nurse and told to take the Macrobid twice daily for one week to treat infection.  When she went to the pharmacy that same day to pick up her prescription for her Macrobid which was previously prescribed for prophylaxis, the pharmacist told her to take it once daily.   The patient chose to follow the pharmacist's recommendation and not ours.    Urine today 1+ ketones, trace protein, trace leukocytes.   GYNECOLOGIC HISTORY: Patient's last menstrual period was 07/06/2013. Contraception:   BTL   Pap 06/15/13 - WNL, bloody smear with endometrial cells, negative HR HPV. (was on cycle)        OB History   Grav Para Term Preterm Abortions TAB SAB Ect Mult Living   5 3 3  2  2   3          Patient Active Problem List   Diagnosis Date Noted  . Pre-employment examination 04/19/2013  . Back pain 11/25/2012  . Abdominal tenderness 11/25/2012  . Dyspnea 04/25/2011  . Palpitations 04/25/2011  . Paresthesia 12/30/2010  . Preventative health care 12/30/2010  . Right carpal tunnel syndrome 12/30/2010  . HYPERSOMNIA 04/10/2010  . Cough 03/05/2009  . LEUKOPENIA, MILD  09/21/2008  . FATIGUE 01/19/2008  . HYPERTHYROIDISM 08/03/2006    Past Medical History  Diagnosis Date  . HYPERTHYROIDISM 08/03/2006  . HYPERSOMNIA 04/10/2010    Past Surgical History  Procedure Laterality Date  . Cesarean section      x3  . Dilation and curettage of uterus      Current Outpatient Prescriptions  Medication Sig Dispense Refill  . methimazole (TAPAZOLE) 10 MG tablet Take 1 tablet (10 mg total) by mouth daily.  30 tablet  0   No current facility-administered medications for this visit.     ALLERGIES: Sulfa antibiotics  Family History  Problem Relation Age of Onset  . Hypertension Mother   . Hyperlipidemia Mother   . Sleep apnea Mother   . Thyroid disease Mother   . Stroke Mother   . Cancer Other     Aunt with Cancer- unknown type  . Lupus Other     Uncle  . Cancer Other     Lung Cancer-Uncle    History   Social History  . Marital Status: Married    Spouse Name: N/A    Number of Children: 3  . Years of Education: N/A   Occupational History  . Not on file.   Social History Main Topics  . Smoking status: Never Smoker   . Smokeless tobacco: Not on file  . Alcohol Use: No  . Drug Use: No  . Sexual Activity:  Yes    Partners: Male    Pharmacist, hospitalBirth Control/ Protection: Surgical     Comment: Tubal   Other Topics Concern  . 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  PHYSICAL EXAMINATION:    BP 90/62  Pulse 76  Ht 5\' 6"  (1.676 m)  Wt 167 lb (75.751 kg)  BMI 26.97 kg/m2  LMP 07/06/2013     General appearance: alert, cooperative and appears stated age  ASSESSMENT  Normal gynecologic exam.  History of cesarean section x 3.  Status post BTL.  Menometrorrhagia.  Dysmenorrhea.  Recurrent UTIs, post coital.  Back pain - low. Rule out UTI today. Likely had incompletely treated E Coli UTI.  PLAN    Urine culture today.  Macrobid 100 mg po bid for one week.  Has Rx. Patient  declines combined oral contraception.  I will prescribe OrthoMicronor for  3 months. Patient instructed in use.  Will come in for a recheck in 10 weeks to assess her menstrual profile. She understands the importance of follow up and reassessment as I am trying to work with her short term due to her cost limitations for the ultrasound,   An After Visit Summary was printed and given to the patient.  __15____ minutes face to face time of which over 50% was spent in counseling.

## 2013-07-23 ENCOUNTER — Encounter: Payer: Self-pay | Admitting: Obstetrics and Gynecology

## 2013-07-25 LAB — URINE CULTURE: Colony Count: >=100000

## 2013-08-09 ENCOUNTER — Ambulatory Visit (INDEPENDENT_AMBULATORY_CARE_PROVIDER_SITE_OTHER): Payer: PRIVATE HEALTH INSURANCE | Admitting: Endocrinology

## 2013-08-09 ENCOUNTER — Encounter: Payer: Self-pay | Admitting: Endocrinology

## 2013-08-09 VITALS — BP 122/62 | HR 91 | Temp 98.7°F | Ht 66.0 in | Wt 169.0 lb

## 2013-08-09 DIAGNOSIS — E0590 Thyrotoxicosis, unspecified without thyrotoxic crisis or storm: Secondary | ICD-10-CM

## 2013-08-09 MED ORDER — METHIMAZOLE 10 MG PO TABS: 10.0000 mg | ORAL_TABLET | Freq: Every day | ORAL | Status: DC

## 2013-08-09 NOTE — Patient Instructions (Signed)
A thyroid blood test is requested for you today.  We'll contact you with results.   if ever you have fever while taking methimazole, stop it and call us, because of the risk of a rare side-effect.   Please come back for a follow-up appointment in 6 months.   

## 2013-08-09 NOTE — Progress Notes (Signed)
   Subjective:    Patient ID: Janet [name], female    DOB: 11/02/1975, 38 y.o.   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  She has chosen to continue the methimazole indefinitely, as she cannot be isolated from her children. She has had tubal ligation.  She takes tapazole 10 mg qd. pt states she feels well in general, except for weight gain Past Medical History  Diagnosis Date  . HYPERTHYROIDISM 08/03/2006  . HYPERSOMNIA 04/10/2010    Past Surgical History  Procedure Laterality Date  . Cesarean section      x3  . Dilation and curettage of uterus      History   Social History  . Marital Status: Married    Spouse Name: N/A    Number of Children: 3  . Years of Education: N/A   Occupational History  . Not on file.   Social History Main Topics  . Smoking status: Never Smoker   . Smokeless tobacco: Not on file  . Alcohol Use: No  . Drug Use: No  . Sexual Activity: Yes    Partners: Male    Birth Control/ Protection: Surgical     Comment: Tubal   Other Topics Concern  . 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 norethindrone (MICRONOR,CAMILA,ERRIN) 0.35 MG tablet Take 1 tablet (0.35 mg total) by mouth daily.  1 Package  2   No current facility-administered medications on file prior to visit.    Allergies  Allergen Reactions  . Sulfa Antibiotics Shortness Of Breath and Swelling    Family History  Problem Relation Age of Onset  . Hypertension Mother   . Hyperlipidemia Mother   . Sleep apnea Mother   . Thyroid disease Mother   . Stroke Mother   . Cancer Other     Aunt with Cancer- unknown type  . Lupus Other     Uncle  . Cancer Other     Lung Cancer-Uncle    BP 122/62  Pulse 91  Temp(Src) 98.7 F (37.1 C) (Oral)  Ht 5\' 6"  (1.676 m)  Wt 169 lb (76.658 kg)  BMI 27.29  kg/m2  SpO2 97%  LMP 07/06/2013  Review of Systems Denies fever.      Objective:   Physical Exam VITAL SIGNS:  See vs page GENERAL: no distress NECK: thyroid is slightly and diffusely enlarged.  No thyroid nodule is palpable.  No palpable lymphadenopathy at the anterior neck.  Lab Results  Component Value Date   TSH 0.11* 08/09/2013      Assessment & Plan:  Hyperthyroidism, moderate exacerbation, usually due to noncompliance.     Patient is advised the following: Patient Instructions  A thyroid blood test is requested for you today.  We'll contact you with results. if ever you have fever while taking methimazole, stop it and call us, because of the risk of a rare side-effect.   Please come back for a follow-up appointment in 6 months.

## 2013-08-10 LAB — TSH: TSH: 0.11 u[IU]/mL — AB (ref 0.35–4.50)

## 2013-08-10 MED ORDER — METHIMAZOLE 10 MG PO TABS: 10.0000 mg | ORAL_TABLET | Freq: Every day | ORAL | Status: DC

## 2013-09-01 ENCOUNTER — Telehealth: Payer: Self-pay | Admitting: Obstetrics and Gynecology

## 2013-09-01 NOTE — Telephone Encounter (Signed)
Patient states she is having a problem with her bc.said she has been spotting the entire time she has been on this medication (a month). Wants to change it.

## 2013-09-01 NOTE — Telephone Encounter (Signed)
Patient had had heavy and irregular bleeding and has declined ultrasound evaluation.  I agreed to a short course of birth control pills to help with her bleeding profile. She declined pills with estrogen and I therefore selected the progesterone only pills. Continue with OrthoMicronor daily at the same time every day.  (Has had BTL.) Bleeding profile should improve.  I need to have the patient return for a recheck with me in the office in September.   If at any time she would like to proceed with a pelvic ultrasound, I will be happy to have that scheduled to see if she has reasons for the bleeding such as polyps or fibroids in the uterus.

## 2013-09-01 NOTE — Telephone Encounter (Signed)
Spoke with patient. Advised of message as seen below. Patient is agreeable and verbalizes understanding. Patient has follow up appointment scheduled for 8/21 at 3:30pm with Dr.Silva. Patient states that she will continue to take Micronor at the same time daily. Will call if she would like to proceed with PUS.  Routing to provider for final review. Patient agreeable to disposition. Will close encounter

## 2013-09-01 NOTE — Telephone Encounter (Signed)
Spoke with patient. Patient states that started taking Micronor on the first day of her cycle. Cycle ended 4-5 days later. Patient started having light "spotting/bleeding that is too much for a panti liner but not like my period." Start again yesterday when patient started on "brown" pills in the pack. Patient states that she has been taking the pills at the same time daily and has not missed any pills. Advised patient that with new birth control it can take 2-3 months for the body to adjust and that spotting is very common during this time. Advised with the brown pills patient should begin to have cycle. Patient states "This makes my cycle early and then I would be expecting it next week." Advised when she restarts a new pack cycle should end and will rebegin with brown pills at the end of the next pack. Advised cycle is still adjusting. Patient is concerned. Advised would send a message to Dr.Silva and give patient a call with any further instructions or recommendations. Patient agreeable.  Dr.Silva, any further recommendations for this patient?

## 2013-09-27 ENCOUNTER — Telehealth: Payer: Self-pay | Admitting: Obstetrics and Gynecology

## 2013-09-27 NOTE — Telephone Encounter (Signed)
Patient moved her 10 day recheck from 09/30/13 to 10/13/13. Will this be okay?

## 2013-09-29 NOTE — Telephone Encounter (Signed)
Ok for appointment on 10/13/13.

## 2013-09-29 NOTE — Telephone Encounter (Signed)
Dr. Edward JollySilva, this is a 10 week follow up.  She was to follow up for short term ocp.  Patient requested to change appointment and was given first available office visit that worked for her both hers and provider schedule.

## 2013-09-30 ENCOUNTER — Ambulatory Visit: Payer: PRIVATE HEALTH INSURANCE | Admitting: Obstetrics and Gynecology

## 2013-10-04 ENCOUNTER — Encounter: Payer: Self-pay | Admitting: Obstetrics and Gynecology

## 2013-10-13 ENCOUNTER — Ambulatory Visit (INDEPENDENT_AMBULATORY_CARE_PROVIDER_SITE_OTHER): Payer: PRIVATE HEALTH INSURANCE | Admitting: Obstetrics and Gynecology

## 2013-10-13 ENCOUNTER — Encounter: Payer: Self-pay | Admitting: Obstetrics and Gynecology

## 2013-10-13 VITALS — BP 120/70 | HR 88 | Resp 20 | Ht 66.0 in | Wt 171.8 lb

## 2013-10-13 DIAGNOSIS — N920 Excessive and frequent menstruation with regular cycle: Secondary | ICD-10-CM

## 2013-10-13 DIAGNOSIS — M25559 Pain in unspecified hip: Secondary | ICD-10-CM

## 2013-10-13 NOTE — Progress Notes (Signed)
Patient ID: Janet [name], female   DOB: 1975-03-17, 38 y.o.   MRN: [id number] GYNECOLOGY  VISIT   HPI: 38 y.o.   Married  [place]  female   [phone] with Patient's last menstrual period was 10/03/2013.   here for  Follow up after beginning Micronor.  Her husband is with her for discussion today.   Patient is on her third pack of pills.  Bled for 7 days in the first pack and 7 days in the second pack.  Cramping was controlled the first pack and now is returning this back. History of fibroids and painful menses.  HGB and TSH normal on 04/19/13 with PCP.  Declines ultrasound due to cost.    I asked patient to show her birth control pills with me because she mentioned brown pills in the pack I confirmed that she was give Microgestin 1/20 by the pharmacy.   Used birth control pills in the past without problems.  No migraine headaches.  No history of blood clots.  Denies cardiac history or respiratory history.   GYNECOLOGIC HISTORY: Patient's last menstrual period was 10/03/2013. Contraception:  Tubal/OCP's-- Microgestin confirmed by looking at the patient's pack. Menopausal hormone therapy: n/a        OB History   Grav Para Term Preterm Abortions TAB SAB Ect Mult Living   Patient Active Problem List   Diagnosis Date Noted  . Pre-employment examination 04/19/2013  . Back pain 11/25/2012  . Abdominal tenderness 11/25/2012  . Dyspnea 04/25/2011  . Palpitations 04/25/2011  . Paresthesia 12/30/2010  . Preventative health care 12/30/2010  . Right carpal tunnel syndrome 12/30/2010  . HYPERSOMNIA 04/10/2010  . Cough 03/05/2009  . LEUKOPENIA, MILD 09/21/2008  . FATIGUE 01/19/2008  . HYPERTHYROIDISM 08/03/2006    Past Medical History  Diagnosis Date  . HYPERTHYROIDISM 08/03/2006  . HYPERSOMNIA 04/10/2010    Past Surgical History  Procedure Laterality Date  . Cesarean section      x3  . Dilation and curettage of uterus      Current  Outpatient Prescriptions  Medication Sig Dispense Refill  . methimazole (TAPAZOLE) 10 MG tablet Take 1 tablet (10 mg total) by mouth daily.  30 tablet  6  . norethindrone (MICRONOR,CAMILA,ERRIN) 0.35 MG tablet Take 1 tablet (0.35 mg total) by mouth daily.  1 Package  2   No current facility-administered medications for this visit.     ALLERGIES: Sulfa antibiotics  Family History  Problem Relation Age of Onset  . Hypertension Mother   . Hyperlipidemia Mother   . Sleep apnea Mother   . Thyroid disease Mother   . Stroke Mother   . Cancer Other     Aunt with Cancer- unknown type  . Lupus Other     Uncle  . Cancer Other     Lung Cancer-Uncle    History   Social History  . Marital Status: Married    Spouse Name: N/A    Number of Children: 3  . Years of Education: N/A   Occupational History  . Not on file.   Social History Main Topics  . Smoking status: Never Smoker   . Smokeless tobacco: Not on file  . Alcohol Use: No  . Drug Use: No  . Sexual Activity: Yes    Partners: Male    Birth Control/ Protection: Surgical     Comment: Tubal   Other Topics  Concern  . 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  PHYSICAL EXAMINATION:    BP 120/70  Pulse 88  Resp 20  Ht  (1.676 m)  Wt 171 lb 12.8 oz (77.928 kg)  BMI 27.74 kg/m2  LMP 10/03/2013     General appearance: alert, cooperative and appears stated age   ASSESSMENT  Status post BTL. Menorrhagia.  History of fibroids.  Pelvic pain.  At her pharmacy, patient given combined oral contraceptive Microgestin 1/20 instead of Micronor which was ordered in Epic.  PLAN  I discussed the benefits of pelvic ultrasound at this point to help make a diagnosis and offer appropriate treatments.  Will [facility] the [facility].  Order was placed in EPIC. I reviewed with her the differences of Microgestin and Micronor.  I printed for the patient a copy  of her medications and allergies for her to take to her pharmacy, and she will discuss this with them.  I reviewed potential treatment options for her - stronger dosed combined oral contraceptive, progesterone only OCP, Mirena IUD, endometrial ablation, myomectomy, and hysterectomy. Written information on Mirena and endometrial ablation.  Patient will follow up here for an appointment after her pelvic ultrasound is done.   An After Visit Summary was printed and given to the patient.  __25____ minutes face to face time of which over 50% was spent in counseling.

## 2013-10-13 NOTE — Patient Instructions (Signed)
Levonorgestrel intrauterine device (IUD) What is this medicine? LEVONORGESTREL IUD (LEE voe nor jes trel) is a contraceptive (birth control) device. The device is placed inside the uterus by a healthcare professional. It is used to prevent pregnancy and can also be used to treat heavy bleeding that occurs during your period. Depending on the device, it can be used for 3 to 5 years. This medicine may be used for other purposes; ask your health care provider or pharmacist if you have questions. COMMON BRAND NAME(S): LILETTA, Mirena, Skyla What should I tell my health care provider before I take this medicine? They need to know if you have any of these conditions: -abnormal Pap smear -cancer of the breast, uterus, or cervix -diabetes -endometritis -genital or pelvic infection now or in the past -have more than one sexual partner or your partner has more than one partner -heart disease -history of an ectopic or tubal pregnancy -immune system problems -IUD in place -liver disease or tumor -problems with blood clots or take blood-thinners -use intravenous drugs -uterus of unusual shape -vaginal bleeding that has not been explained -an unusual or allergic reaction to levonorgestrel, other hormones, silicone, or polyethylene, medicines, foods, dyes, or preservatives -pregnant or trying to get pregnant -breast-feeding How should I use this medicine? This device is placed inside the uterus by a health care professional. Talk to your pediatrician regarding the use of this medicine in children. Special care may be needed. Overdosage: If you think you have taken too much of this medicine contact a poison control center or emergency room at once. NOTE: This medicine is only for you. Do not share this medicine with others. What if I miss a dose? This does not apply. What may interact with this medicine? Do not take this medicine with any of the following  medications: -amprenavir -bosentan -fosamprenavir This medicine may also interact with the following medications: -aprepitant -barbiturate medicines for inducing sleep or treating seizures -bexarotene -griseofulvin -medicines to treat seizures like carbamazepine, ethotoin, felbamate, oxcarbazepine, phenytoin, topiramate -modafinil -pioglitazone -rifabutin -rifampin -rifapentine -some medicines to treat HIV infection like atazanavir, indinavir, lopinavir, nelfinavir, tipranavir, ritonavir -St. John's wort -warfarin This list may not describe all possible interactions. Give your health care provider a list of all the medicines, herbs, non-prescription drugs, or dietary supplements you use. Also tell them if you smoke, drink alcohol, or use illegal drugs. Some items may interact with your medicine. What should I watch for while using this medicine? Visit your doctor or health care professional for regular check ups. See your doctor if you or your partner has sexual contact with others, becomes HIV positive, or gets a sexual transmitted disease. This product does not protect you against HIV infection (AIDS) or other sexually transmitted diseases. You can check the placement of the IUD yourself by reaching up to the top of your vagina with clean fingers to feel the threads. Do not pull on the threads. It is a good habit to check placement after each menstrual period. Call your doctor right away if you feel more of the IUD than just the threads or if you cannot feel the threads at all. The IUD may come out by itself. You may become pregnant if the device comes out. If you notice that the IUD has come out use a backup birth control method like condoms and call your health care provider. Using tampons will not change the position of the IUD and are okay to use during your period. What side effects may   I notice from receiving this medicine? Side effects that you should report to your doctor or  health care professional as soon as possible: -allergic reactions like skin rash, itching or hives, swelling of the face, lips, or tongue -fever, flu-like symptoms -genital sores -high blood pressure -no menstrual period for 6 weeks during use -pain, swelling, warmth in the leg -pelvic pain or tenderness -severe or sudden headache -signs of pregnancy -stomach cramping -sudden shortness of breath -trouble with balance, talking, or walking -unusual vaginal bleeding, discharge -yellowing of the eyes or skin Side effects that usually do not require medical attention (report to your doctor or health care professional if they continue or are bothersome): -acne -breast pain -change in sex drive or performance -changes in weight -cramping, dizziness, or faintness while the device is being inserted -headache -irregular menstrual bleeding within first 3 to 6 months of use -nausea This list may not describe all possible side effects. Call your doctor for medical advice about side effects. You may report side effects to FDA at 1-800-FDA-1088. Where should I keep my medicine? This does not apply. NOTE: This sheet is a summary. It may not cover all possible information. If you have questions about this medicine, talk to your doctor, pharmacist, or health care provider.  2015, Elsevier/Gold Standard. (2011-02-27 13:54:04)  Endometrial Ablation Endometrial ablation removes the lining of the uterus (endometrium). It is usually a same-day, outpatient treatment. Ablation helps avoid major surgery, such as surgery to remove the cervix and uterus (hysterectomy). After endometrial ablation, you will have little or no menstrual bleeding and may not be able to have children. However, if you are premenopausal, you will need to use a reliable method of birth control following the procedure because of the small chance that pregnancy can occur. There are different reasons to have this procedure, which  include:  Heavy periods.  Bleeding that is causing anemia.  Irregular bleeding.  Bleeding fibroids on the lining inside the uterus if they are smaller than 3 centimeters. This procedure should not be done if:  You want children in the future.  You have severe cramps with your menstrual period.  You have precancerous or cancerous cells in your uterus.  You were recently pregnant.  You have gone through menopause.  You have had major surgery on the uterus, such as a cesarean delivery. LET [facility] CARE PROVIDER KNOW ABOUT:  Any allergies you have.  All medicines you are taking, including vitamins, herbs, eye drops, creams, and over-the-counter medicines.  Previous problems you or members of your family have had with the use of anesthetics.  Any blood disorders you have.  Previous surgeries you have had.  Medical conditions you have. RISKS AND COMPLICATIONS  Generally, this is a safe procedure. However, as with any procedure, complications can occur. Possible complications include:  Perforation of the uterus.  Bleeding.  Infection of the uterus, bladder, or vagina.  Injury to surrounding organs.  An air bubble to the lung (air embolus).  Pregnancy following the procedure.  Failure of the procedure to help the problem, requiring hysterectomy.  Decreased ability to diagnose cancer in the lining of the uterus. BEFORE THE PROCEDURE  The lining of the uterus must be tested to make sure there is no pre-cancerous or cancer cells present.  An ultrasound may be performed to look at the size of the uterus and to check for abnormalities.  Medicines may be given to thin the lining of the uterus. PROCEDURE  During the procedure, your  health care provider will use a tool called a resectoscope to help see inside your uterus. There are different ways to remove the lining of your uterus.   Radiofrequency - This method uses a radiofrequency-alternating electric current to  remove the lining of the uterus.  Cryotherapy - This method uses extreme cold to freeze the lining of the uterus.  Heated-Free Liquid - This method uses heated salt (saline) solution to remove the lining of the uterus.  Microwave - This method uses high-energy microwaves to heat up the lining of the uterus to remove it.  Thermal balloon - This method involves inserting a catheter with a balloon tip into the uterus. The balloon tip is filled with heated fluid to remove the lining of the uterus. AFTER THE PROCEDURE  After your procedure, do not have sexual intercourse or insert anything into your vagina until permitted by your health care provider. After the procedure, you may experience:  Cramps.  Vaginal discharge.  Frequent urination. Document Released: 12/07/2003 Document Revised: 09/29/2012 Document Reviewed: 06/30/2012 [facility] Patient Information 2015 [place], [place]. This information is not intended to replace advice given to you by your health care provider. Make sure you discuss any questions you have with your health care provider.

## 2013-10-14 ENCOUNTER — Telehealth: Payer: Self-pay | Admitting: Obstetrics and Gynecology

## 2013-10-14 NOTE — Telephone Encounter (Signed)
Spoke with patient. Advised of appointment for 9/15 at 2:45pm. Patient states that she is unable to make that appointment due to another appointment at 3:30pm. Appointment res[facility]uled with [facility] at 12:45pm. Spoke with patient. Advised of appointment. Patient is agreeable to date and time.  Routing to provider for final review. Patient agreeable to disposition. Will close encounter

## 2013-10-14 NOTE — Telephone Encounter (Signed)
Pt has question about her medication. Pt says that pharmacy filled the wrong rx.

## 2013-10-14 NOTE — Telephone Encounter (Signed)
Spoke with patient. Patient states that she thought she was going to hear back today about scheduling for an ultrasound at [facility] hospital. Patient is available Tuesday, Thursday, and Friday after 11:30am. Patient is asking what mediciation is on our system for her to be taking. Advised we have micronor in our system for her OCP. Patient states that she was given the wrong OCP from [facility] and has been taking it. She was given Microgestin 1/20. Dr.Silva is aware as this was discovered at appointment on 9/3. Patient states that she would like the order in the computer to stay as Micronor as that is what Dr.Silva recommends that she take. Patient would not like to switch from Microgestin 1/20 to micronor at this time. "I do not know how it will effect my body since I am half way through this pack and have been taking it. I want to get my ultrasound first and then see what I need to do." Advised would [facility] to [facility] and call patient back. Patient agreeable.

## 2013-10-18 ENCOUNTER — Telehealth: Payer: Self-pay | Admitting: Obstetrics and Gynecology

## 2013-10-18 ENCOUNTER — Other Ambulatory Visit: Payer: Self-pay | Admitting: Obstetrics and Gynecology

## 2013-10-18 MED ORDER — NORETHIN ACE-ETH ESTRAD-FE 1-20 MG-MCG PO TABS: 1.0000 | ORAL_TABLET | Freq: Every day | ORAL | Status: DC

## 2013-10-18 NOTE — Telephone Encounter (Signed)
I will be switching her OCP in the computer to the LoEstrin 1/20 Fe so there is not any confusion with prescribers in EPIC.

## 2013-10-18 NOTE — Telephone Encounter (Signed)
Pt wants copy of the telephone calls about when she was having problems with a medication. Pt is stating the pharmacy is wanting it. Pt says that she never got the right medication. Pt says that the pharmacy knows that they filled the wrong medication said she was supposed to get two but both were not the right thing. Unsure of what the patient is really needing.

## 2013-10-19 ENCOUNTER — Other Ambulatory Visit: Payer: Self-pay | Admitting: Obstetrics and Gynecology

## 2013-10-19 MED ORDER — NORETHINDRONE ACET-ETHINYL EST 1-20 MG-MCG PO TABS: 1.0000 | ORAL_TABLET | Freq: Every day | ORAL | Status: DC

## 2013-10-19 MED ORDER — NORETHINDRONE 0.35 MG PO TABS: 1.0000 | ORAL_TABLET | Freq: Every day | ORAL | Status: DC

## 2013-10-19 NOTE — Telephone Encounter (Signed)
Information for patient about Mirena IUD and endometrial ablation were part of her going home instructions for her patient visit.  I was under the understanding that the patient could print their after visit summary and instructions from home if they were on My Chart.

## 2013-10-19 NOTE — Telephone Encounter (Addendum)
Call to patient. Advised all info reviewed with Dr Edward Jolly.  Now that we know she is on Microgestin 1/20, we must document accordingly on medication list. Will need to discontinue Micronor order that was entered today.   Per dr Rica Records recommendation, she should continue the current pack of Microgestion 1/20 and not make a medication change until PUS completed. Will then need to see Dr Edward Jolly and discuss result of PUS and decide what, if any, changes to medication should be made.  Since PUS is not scheduled until 11-01-13, if completes this pack, she should start new pack of same med, Microgestin 1/20 so that there are no changes in mediction until PUS and consult completed. Offerred to move PUS appointment to earlier date so she would not have to start new pill pack. Patient declines. Agreeable to start new pack of Microgestin 1/20. Recommended we proceed with scheduling follow up to discuss results. Patien agreeable to consult appointment on 11-03-13 at 1pm. Advised she may have pharmacy call me if additional information needed about original prescription.  Micronor discontinued and new RX for Microgestin 1/20, 1 pack with no refills sent to new pharm, per patient request, [facility]. Only one pack to get patient to follow up appointment with Dr Edward Jolly.  Patient states the treatment options that Dr Edward Jolly discussed with her are not visible on My Chart as she expected based on visit. Advised I will check with Dr Edward Jolly and see if we can send a My Chart message with this info.

## 2013-10-19 NOTE — Telephone Encounter (Signed)
The medication in the computer needs to reflect what the patient is actually taking so we do not create confusion for anyone providing care for her.  Please make this change back to LoEstrin 1/20.  I recommend that the patient take the LoEstrin 1/20 until after the ultrasound is done.  Patient will need a follow up visit with me in the office after her ultrasound is done so we can find a good next step for her treatment.  Thank you.   Cc - [facility]

## 2013-10-19 NOTE — Addendum Note (Signed)
Addended by: Alisa Graff on: 10/19/2013 05:47 PM   Modules accepted: Orders, Medications

## 2013-10-19 NOTE — Telephone Encounter (Signed)
Spoke with patient. Patient states that she is going to be "compensated" from [facility] for "giving me the wrong prescription." "They are aware that they made a mistake and are going to take care of it because it could have been causing me the problems I was having. I need to know who changed my prescription in the computer yesterday because it looks like you guys are working with [employer]. I need everything left how it was so I can get this taken care of." Patient requests that order for micronor be placed back onto her active medications and that Junel Fe be removed. "They won't fix it if it is changed in the computer." States that she wants it to have Micronor as the active pill until she gets compensated. Patient is going to continue taking the microgestin fe until she is done with her pack and has had her ultrasound. Patient will then want to speak with Dr.Silva about results of ultrasound and what pills she should be on at that time. Advised patient will switch orders back in the computer and she can call once she has had her ultrasound and would like to make changes to what she is taking. Patient is agreeable.   Order switched back to have micronor as active pill as it was previously with note not to dispense until patient has completed previous OCP. Order for Junel Fe discontinued. Patient will complete Microgestin Fe until pack is gone and has had ultrasound. Then will call to go from there with what OCP is best.  Routing to provider for final review. Patient agreeable to disposition. Will close encounter.

## 2013-10-20 ENCOUNTER — Telehealth: Payer: Self-pay | Admitting: Obstetrics and Gynecology

## 2013-10-20 NOTE — Telephone Encounter (Signed)
Pt says she spoke to Sally on [place] and was told that if she needed any documentation she could call and Kennon Rounds would give it to her.

## 2013-10-20 NOTE — Telephone Encounter (Signed)
Pt filled out release and pick up papers

## 2013-10-20 NOTE — Telephone Encounter (Signed)
Routing to Elberta for AVS.

## 2013-10-20 NOTE — Telephone Encounter (Signed)
Thank you :)

## 2013-10-20 NOTE — Telephone Encounter (Addendum)
See previous phone notes. Called patient, from previous phone note, not aware that patient had called in requesting documentation.  Patient states she has a meeting with [facility] and needs documentation of phone notes to take with her. Advised she will need to sign release of records for this.  Patient will come in at 3 pm to pick up this info. Advised will also have AVS from 10-12-13 OV with information regarding IUD and ablation as requested in previous phone note.  Phone notes from phone call 09-01-13 printed for your signature.

## 2013-10-20 NOTE — Telephone Encounter (Signed)
Call to patient to advise that i have reprinted AVS from her 10-13-13 visit with the information on IUD and ablation for her and we can mail. Patient states she has a meeting with [facility] to discuss the medication and she would like copy of phone notes from when she called with side effects. Advised she will have to sign release for this information. She will come in today around 3 pm to pick up this info. Will leave AVS for patient as well.  Phone notes printed for your signature before release.

## 2013-10-22 ENCOUNTER — Encounter (HOSPITAL_COMMUNITY): Payer: Self-pay | Admitting: *Deleted

## 2013-10-22 ENCOUNTER — Inpatient Hospital Stay (HOSPITAL_COMMUNITY)
Admission: AD | Admit: 2013-10-22 | Discharge: 2013-10-22 | Disposition: A | Discharge status: Home / Self Care | Payer: PRIVATE HEALTH INSURANCE | Source: Ambulatory Visit | Attending: Gynecology | Admitting: Gynecology

## 2013-10-22 DIAGNOSIS — R109 Unspecified abdominal pain: Secondary | ICD-10-CM | POA: Diagnosis present

## 2013-10-22 LAB — POCT PREGNANCY, URINE: PREG TEST UR: NEGATIVE

## 2013-10-22 LAB — URINALYSIS, ROUTINE W REFLEX MICROSCOPIC
Bilirubin Urine: NEGATIVE
Glucose, UA: NEGATIVE mg/dL
Ketones, ur: NEGATIVE mg/dL
LEUKOCYTES UA: NEGATIVE
Nitrite: NEGATIVE
PROTEIN: NEGATIVE mg/dL
Specific Gravity, Urine: 1.015 (ref 1.005–1.030)
UROBILINOGEN UA: 0.2 mg/dL (ref 0.0–1.0)
pH: 7 (ref 5.0–8.0)

## 2013-10-22 LAB — URINE MICROSCOPIC-ADD ON

## 2013-10-22 MED ORDER — IBUPROFEN 800 MG PO TABS
800.0000 mg | ORAL_TABLET | Freq: Once | ORAL | Status: AC
  Administered 2013-10-22: 800 mg via ORAL
  Filled 2013-10-22: qty 1

## 2013-10-22 MED ORDER — KETOROLAC TROMETHAMINE 60 MG/2ML IM SOLN: 60.0000 mg | Freq: Once | INTRAMUSCULAR | Status: DC

## 2013-10-22 MED ORDER — HYDROCODONE-ACETAMINOPHEN 5-325 MG PO TABS: 1.0000 | ORAL_TABLET | ORAL | Status: DC | PRN

## 2013-10-22 MED ORDER — IBUPROFEN 800 MG PO TABS: 800.0000 mg | ORAL_TABLET | Freq: Three times a day (TID) | ORAL | Status: DC

## 2013-10-22 NOTE — MAU Provider Note (Signed)
History     CSN: [id number]  Arrival date and time: 10/22/13 7829   First Provider Initiated Contact with Patient 10/22/13 1209      Chief Complaint  Patient presents with  . Abdominal Pain   HPI  [name] is a 38 y.o. female who presents with abdominal pain times 1 month. She is having pain in her abdomen that is associated with Nausea. The pain is located all through the lower part of her stomach. She took ibuprofen 1 month ago and the pain went away, however the pain has been coming and going. She is currently spotting. The patient is not on her menstrual cycle; she currently takes birth control pills. No new sexual partners; pt is married. She has a history of uterine fibroids, "that was years ago."  The patient has not taken anything for pain today.   OB History   Grav Para Term Preterm Abortions TAB SAB Ect Mult Living   Past Medical History  Diagnosis Date  . HYPERTHYROIDISM 08/03/2006  . HYPERSOMNIA 04/10/2010    Past Surgical History  Procedure Laterality Date  . Cesarean section      x3  . Dilation and curettage of uterus      Family History  Problem Relation Age of Onset  . Hypertension Mother   . Hyperlipidemia Mother   . Sleep apnea Mother   . Thyroid disease Mother   . Stroke Mother   . Cancer Other     Aunt with Cancer- unknown type  . Lupus Other     Uncle  . Cancer Other     Lung Cancer-Uncle    History  Substance Use Topics  . Smoking status: Never Smoker   . Smokeless tobacco: Never Used  . Alcohol Use: No    Allergies:  Allergies  Allergen Reactions  . Sulfa Antibiotics Shortness Of Breath and Swelling    Prescriptions prior to admission  Medication Sig Dispense Refill  . methimazole (TAPAZOLE) 10 MG tablet Take 1 tablet (10 mg total) by mouth daily.  30 tablet  6  . norethindrone-ethinyl estradiol (MICROGESTIN,JUNEL,LOESTRIN) 1-20 MG-MCG tablet Take 1 tablet by mouth daily.  1 Package  0    Results for orders placed during the hospital encounter of 10/22/13 (from the past 48 hour(s))  URINALYSIS, ROUTINE W REFLEX MICROSCOPIC     Status: Abnormal   Collection Time    10/22/13 11:05 AM      Result Value Ref Range   Color, Urine YELLOW  YELLOW   APPearance CLEAR  CLEAR   Specific Gravity, Urine 1.015  1.005 - 1.030   pH 7.0  5.0 - 8.0   Glucose, UA NEGATIVE  NEGATIVE mg/dL   Hgb urine dipstick MODERATE (*) NEGATIVE   Bilirubin Urine NEGATIVE  NEGATIVE   Ketones, ur NEGATIVE  NEGATIVE mg/dL   Protein, ur NEGATIVE  NEGATIVE mg/dL   Urobilinogen, UA 0.2  0.0 - 1.0 mg/dL   Nitrite NEGATIVE  NEGATIVE   Leukocytes, UA NEGATIVE  NEGATIVE  URINE MICROSCOPIC-ADD ON     Status: Abnormal   Collection Time    10/22/13 11:05 AM      Result Value Ref Range   Squamous Epithelial / LPF RARE  RARE   WBC, UA 0-2  <3 WBC/hpf   RBC / HPF 3-6  <3 RBC/hpf   Bacteria, UA FEW (*) RARE   Urine-Other MUCOUS PRESENT  POCT PREGNANCY, URINE     Status: None   Collection Time    10/22/13 11:15 AM      Result Value Ref Range   Preg Test, Ur NEGATIVE  NEGATIVE   Comment:            THE SENSITIVITY OF THIS     METHODOLOGY IS >24 mIU/mL    Review of Systems  Constitutional: Negative for fever and chills.  Gastrointestinal: Positive for nausea and abdominal pain. Negative for diarrhea and constipation.  Genitourinary:       No vaginal discharge. + vaginal bleeding; + spotting  No dysuria.    Physical Exam   Blood pressure 123/81, pulse 81, temperature 98.1 F (36.7 C), temperature source Oral, resp. rate 18, height  (1.676 m), weight 77.792 kg (171 lb 8 oz), last menstrual period 10/21/2013.  Physical Exam  Constitutional: She is oriented to person, place, and time. Vital signs are normal. She appears well-developed and well-nourished.  Non-toxic appearance. She does not have a sickly appearance. She does not appear ill. No distress.  HENT:  Head: Normocephalic.  Eyes: Pupils  are equal, round, and reactive to light.  Neck: Neck supple.  Respiratory: Effort normal.  GI: Soft. Normal appearance. There is generalized tenderness. There is no rebound and no guarding.  Genitourinary:  Bimanual exam: Cervix closed Uterus with tenderness, slightly enlarged  Adnexa non tender, no masses bilaterally Chaperone present for exam.   Musculoskeletal: Normal range of motion.  Neurological: She is alert and oriented to person, place, and time.  Skin: Skin is warm. She is not diaphoretic.  Psychiatric: Her behavior is normal.    MAU Course  Procedures None  MDM Negative fever toradol 60- pt declined 800 mg Ibuprofen PO Discussed patient with Dr. Beatrix Shipper who recommends the patient follow up in the office on Monday; pt is to call.  Pt states that ibuprofen helped pain only minimally; pt requested a stronger medication for home.  Assessment and Plan   A:  1. Abdominal pain in female    P:  Discharge home in stable condition RX: Ibuprofen, Vicodin #6 no refills Patient is to call the office on Monday to schedule a follow up appointment Return to MAU if symptoms worsen  Ok for the patient to stop BC pills as requested; discussed possible irregular bleeding as a result.   Iona Hansen Rasch, NP  10/22/2013, 4:42 PM

## 2013-10-22 NOTE — MAU Note (Signed)
Pt states here for abd pain in upper and lower portion of her abdomen. BC pills may be making stomach upset. Pain begins when spotting/bleeding. On third pack of bc pills. Pain intermittent since late July. Only hurts when taking bc pills. Currently spotting. LMP usually begins end of month. Spotting began yesterday. Pain began back this am, making patient feel nauseous. Denies abnormal vag d/c. Denies constipation or diarrhea. Had one episode of diarrhea Wednesday

## 2013-10-22 NOTE — Discharge Instructions (Signed)
Abdominal Pain, Women °Abdominal (stomach, pelvic, or belly) pain can be caused by many things. It is important to tell your doctor: °· The location of the pain. °· Does it come and go or is it present all the time? °· Are there things that start the pain (eating certain foods, exercise)? °· Are there other symptoms associated with the pain (fever, nausea, vomiting, diarrhea)? °All of this is helpful to know when trying to find the cause of the pain. °CAUSES  °· Stomach: virus or bacteria infection, or ulcer. °· Intestine: appendicitis (inflamed appendix), regional ileitis (Crohn's disease), ulcerative colitis (inflamed colon), irritable bowel syndrome, diverticulitis (inflamed diverticulum of the colon), or cancer of the stomach or intestine. °· Gallbladder disease or stones in the gallbladder. °· Kidney disease, kidney stones, or infection. °· Pancreas infection or cancer. °· Fibromyalgia (pain disorder). °· Diseases of the female organs: °¨ Uterus: fibroid (non-cancerous) tumors or infection. °¨ Fallopian tubes: infection or tubal pregnancy. °¨ Ovary: cysts or tumors. °¨ Pelvic adhesions (scar tissue). °¨ Endometriosis (uterus lining tissue growing in the pelvis and on the pelvic organs). °¨ Pelvic congestion syndrome (female organs filling up with blood just before the menstrual period). °¨ Pain with the menstrual period. °¨ Pain with ovulation (producing an egg). °¨ Pain with an IUD (intrauterine device, birth control) in the uterus. °¨ Cancer of the female organs. °· Functional pain (pain not caused by a disease, may improve without treatment). °· Psychological pain. °· Depression. °DIAGNOSIS  °Your doctor will decide the seriousness of your pain by doing an examination. °· Blood tests. °· X-rays. °· Ultrasound. °· CT scan (computed tomography, special type of X-ray). °· MRI (magnetic resonance imaging). °· Cultures, for infection. °· Barium enema (dye inserted in the large intestine, to better view it with  X-rays). °· Colonoscopy (looking in intestine with a lighted tube). °· Laparoscopy (minor surgery, looking in abdomen with a lighted tube). °· Major abdominal exploratory surgery (looking in abdomen with a large incision). °TREATMENT  °The treatment will depend on the cause of the pain.  °· Many cases can be observed and treated at home. °· Over-the-counter medicines recommended by your caregiver. °· Prescription medicine. °· Antibiotics, for infection. °· Birth control pills, for painful periods or for ovulation pain. °· Hormone treatment, for endometriosis. °· Nerve blocking injections. °· Physical therapy. °· Antidepressants. °· Counseling with a psychologist or psychiatrist. °· Minor or major surgery. °HOME CARE INSTRUCTIONS  °· Do not take laxatives, unless directed by your caregiver. °· Take over-the-counter pain medicine only if ordered by your caregiver. Do not take aspirin because it can cause an upset stomach or bleeding. °· Try a clear liquid diet (broth or water) as ordered by your caregiver. Slowly move to a bland diet, as tolerated, if the pain is related to the stomach or intestine. °· Have a thermometer and take your temperature several times a day, and record it. °· Bed rest and sleep, if it helps the pain. °· Avoid sexual intercourse, if it causes pain. °· Avoid stressful situations. °· Keep your follow-up appointments and tests, as your caregiver orders. °· If the pain does not go away with medicine or surgery, you may try: °¨ Acupuncture. °¨ Relaxation exercises (yoga, meditation). °¨ Group therapy. °¨ Counseling. °SEEK MEDICAL CARE IF:  °· You notice certain foods cause stomach pain. °· Your home care treatment is not helping your pain. °· You need stronger pain medicine. °· You want your IUD removed. °· You feel faint or   lightheaded. °· You develop nausea and vomiting. °· You develop a rash. °· You are having side effects or an allergy to your medicine. °SEEK IMMEDIATE MEDICAL CARE IF:  °· Your  pain does not go away or gets worse. °· You have a fever. °· Your pain is felt only in portions of the abdomen. The right side could possibly be appendicitis. The left lower portion of the abdomen could be colitis or diverticulitis. °· You are passing blood in your stools (bright red or black tarry stools, with or without vomiting). °· You have blood in your urine. °· You develop chills, with or without a fever. °· You pass out. °MAKE SURE YOU:  °· Understand these instructions. °· Will watch your condition. °· Will get help right away if you are not doing well or get worse. °Document Released: 11/24/2006 Document Revised: 06/13/2013 Document Reviewed: 12/14/2008 °ExitCare® Patient Information ©2015 [employer]. This information is not intended to replace advice given to you by your health care provider. Make sure you discuss any questions you have with your health care provider. ° °

## 2013-10-22 NOTE — MAU Note (Signed)
Patient unable to void on arrival to MAU; informed of need for urine specimen to complete a U/A and pregnancy test.

## 2013-10-24 ENCOUNTER — Telehealth: Payer: Self-pay

## 2013-10-24 NOTE — Telephone Encounter (Signed)
Message copied by Jannet Askew on Mon Oct 24, 2013  1:40 PM ------      Message from: AMUNDSON DE Gwenevere Ghazi, BROOK E      Created: Mon Oct 24, 2013 12:35 PM      Regarding: follow up from visit to MAU       Patient went to the hospital for pain during the weekend.       She needs to have the ultrasound performed.       Please have this ultrasound moved up.  I believe that it set for 9/22 for [facility].       She will need a follow up with me after the ultrasound is done.             Thanks,            [employer] ------

## 2013-10-24 NOTE — Telephone Encounter (Signed)
Spoke with imaging department at [facility] hospital. Appointment moved to Thursday at 3:30pm. Spoke with patient. Patient states that she is unable to make this appointment due to her daughter having an appointment on Thursday at 4pm. Would like me to call to see if they have anything available for Friday or earlier on Thursday. Spoke with the imaging center at [facility] hospital. Appointment moved to Friday the 18th at 1:30pm. Spoke with patient. Patient agreeable to date and time. Patient states "I really want to switch off of this birth control. Can I be seen on Friday with Dr.Silva to go over the results and switch birth control?" Advised patient would have to check scheduling and call patient back. Patient agreeable.

## 2013-10-24 NOTE — MAU Provider Note (Signed)
I have reviewed the patient's note now. I was not the on call provider for her visit to MAU.

## 2013-10-24 NOTE — Telephone Encounter (Signed)
Spoke with patient. Advised of message as seen below from Dr.Silva. Patient states she will be able to move appointment to Thursday or Friday after 11:30am. Advised will call over to [facility] hospital to see about rescheduling to a sooner date. Patient agreeable.

## 2013-10-25 ENCOUNTER — Ambulatory Visit (INDEPENDENT_AMBULATORY_CARE_PROVIDER_SITE_OTHER): Payer: PRIVATE HEALTH INSURANCE | Admitting: Family Medicine

## 2013-10-25 ENCOUNTER — Ambulatory Visit (INDEPENDENT_AMBULATORY_CARE_PROVIDER_SITE_OTHER): Payer: PRIVATE HEALTH INSURANCE

## 2013-10-25 ENCOUNTER — Telehealth: Payer: Self-pay | Admitting: *Deleted

## 2013-10-25 ENCOUNTER — Ambulatory Visit (HOSPITAL_COMMUNITY): Payer: PRIVATE HEALTH INSURANCE

## 2013-10-25 VITALS — BP 130/68 | HR 108 | Temp 98.1°F | Resp 16 | Ht 67.5 in | Wt 172.6 lb

## 2013-10-25 DIAGNOSIS — S134XXA Sprain of ligaments of cervical spine, initial encounter: Secondary | ICD-10-CM

## 2013-10-25 DIAGNOSIS — S139XXA Sprain of joints and ligaments of unspecified parts of neck, initial encounter: Secondary | ICD-10-CM

## 2013-10-25 DIAGNOSIS — M542 Cervicalgia: Secondary | ICD-10-CM

## 2013-10-25 DIAGNOSIS — R51 Headache: Secondary | ICD-10-CM

## 2013-10-25 DIAGNOSIS — Z23 Encounter for immunization: Secondary | ICD-10-CM

## 2013-10-25 MED ORDER — HYDROCODONE-ACETAMINOPHEN 5-325 MG PO TABS: 1.0000 | ORAL_TABLET | ORAL | Status: DC | PRN

## 2013-10-25 MED ORDER — METHOCARBAMOL 500 MG PO TABS: ORAL_TABLET | ORAL | Status: DC

## 2013-10-25 MED ORDER — DICLOFENAC SODIUM 75 MG PO TBEC: 75.0000 mg | DELAYED_RELEASE_TABLET | Freq: Two times a day (BID) | ORAL | Status: DC

## 2013-10-25 NOTE — Telephone Encounter (Signed)
Call to patient to discuss scheduling options. Offerred PUS in office on Thursday followed by consult with Dr Edward Jolly. Appt times of 8 or 130.  Currently has PUS scheduled for Friday at [facility] at 130. Advised first available consult with Dr Edward Jolly following scheduled PUS would be on Monday 10-31-13. Explained PUS in office allows for quicker consult with Dr Edward Jolly to discuss results but it is  patient preference on where/when she schedules.  Patient to consider options and call back tomorrow with decision.  Routing to provider for final review. Patient agreeable to disposition. Will close encounter
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
## 2013-10-25 NOTE — Telephone Encounter (Signed)
Message copied by Alisa Graff on Tue Oct 25, 2013  4:21 PM ------      Message from: AMUNDSON DE Gwenevere Ghazi, BROOK E      Created: Mon Oct 24, 2013 12:35 PM      Regarding: follow up from visit to MAU       Patient went to the hospital for pain during the weekend.       She needs to have the ultrasound performed.       Please have this ultrasound moved up.  I believe that it set for 9/22 for [facility].       She will need a follow up with me after the ultrasound is done.             Thanks,            [employer] ------
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
## 2013-10-26 ENCOUNTER — Telehealth: Payer: Self-pay | Admitting: *Deleted

## 2013-10-26 NOTE — Telephone Encounter (Signed)
Patient calls requesting to have PUS in office on Thursday. Appt scheduled for 10-27-13 at 130 and consult to follow with Dr Edward Jolly.  Transferred to [place] about [employer].

## 2013-10-27 ENCOUNTER — Other Ambulatory Visit: Payer: PRIVATE HEALTH INSURANCE | Admitting: Obstetrics and Gynecology

## 2013-10-27 ENCOUNTER — Other Ambulatory Visit: Payer: PRIVATE HEALTH INSURANCE

## 2013-10-27 ENCOUNTER — Other Ambulatory Visit (HOSPITAL_COMMUNITY): Payer: PRIVATE HEALTH INSURANCE

## 2013-10-27 NOTE — Telephone Encounter (Signed)
Previously scheduled PUS at [facility] is still scheduled (was waiting till completed appointment today). Call to patient to see if wants to keep appointment as scheduled for tomorrow. LMTCB.

## 2013-10-27 NOTE — Telephone Encounter (Signed)
Pt came in 17 min late for 1:30 ultrasound appointment. Please call to reschedule.

## 2013-10-27 NOTE — Telephone Encounter (Signed)
Thank you for the update!

## 2013-10-27 NOTE — Telephone Encounter (Signed)
Patient returned call. States she was late for appointment today due to another appointment taking longer than expected. Advised appointment at [facility] tomorrow at 130 is still available. Patient needs appointment to be around 1245 due to child care pick-up. Advised that we are limited on how to help her till she gets PUS completed and she may need to ask for help from family to make this a priority. Patient explains she has asked for help but has a lot going on right now. Wants to go to appointment but needs a different time.  Advised that since she is scheduled, she may call and see what other time options are available. Given phone number to radiology [phone]. Advised to let me know if she decides not to keep appointment so that it can be canceled.  Routing to provider for final review. Patient agreeable to disposition. Will close encounter

## 2013-10-28 ENCOUNTER — Ambulatory Visit (HOSPITAL_COMMUNITY)
Admission: RE | Admit: 2013-10-28 | Discharge: 2013-10-28 | Disposition: A | Discharge status: Home / Self Care | Payer: PRIVATE HEALTH INSURANCE | Source: Ambulatory Visit | Attending: Obstetrics and Gynecology | Admitting: Obstetrics and Gynecology

## 2013-10-28 ENCOUNTER — Other Ambulatory Visit: Payer: Self-pay | Admitting: Obstetrics and Gynecology

## 2013-10-28 DIAGNOSIS — N938 Other specified abnormal uterine and vaginal bleeding: Secondary | ICD-10-CM | POA: Insufficient documentation

## 2013-10-28 DIAGNOSIS — R109 Unspecified abdominal pain: Secondary | ICD-10-CM | POA: Insufficient documentation

## 2013-10-28 DIAGNOSIS — N920 Excessive and frequent menstruation with regular cycle: Secondary | ICD-10-CM

## 2013-10-28 DIAGNOSIS — N925 Other specified irregular menstruation: Secondary | ICD-10-CM | POA: Insufficient documentation

## 2013-10-28 DIAGNOSIS — M25559 Pain in unspecified hip: Secondary | ICD-10-CM

## 2013-10-28 DIAGNOSIS — N949 Unspecified condition associated with female genital organs and menstrual cycle: Secondary | ICD-10-CM | POA: Insufficient documentation

## 2013-10-28 NOTE — Telephone Encounter (Signed)
Patient is scheduled 09.18.2015 at [facility].

## 2013-10-28 NOTE — Telephone Encounter (Signed)
See next phone note.  Routing to provider for final review. Patient agreeable to disposition. Will close encounter.    

## 2013-11-01 ENCOUNTER — Ambulatory Visit (HOSPITAL_COMMUNITY): Payer: PRIVATE HEALTH INSURANCE

## 2013-11-03 ENCOUNTER — Ambulatory Visit (INDEPENDENT_AMBULATORY_CARE_PROVIDER_SITE_OTHER): Payer: PRIVATE HEALTH INSURANCE | Admitting: Obstetrics and Gynecology

## 2013-11-03 ENCOUNTER — Encounter: Payer: Self-pay | Admitting: Obstetrics and Gynecology

## 2013-11-03 VITALS — BP 110/60 | HR 104 | Ht 67.5 in | Wt 172.4 lb

## 2013-11-03 DIAGNOSIS — R102 Pelvic and perineal pain: Secondary | ICD-10-CM

## 2013-11-03 DIAGNOSIS — N926 Irregular menstruation, unspecified: Secondary | ICD-10-CM

## 2013-11-03 DIAGNOSIS — N949 Unspecified condition associated with female genital organs and menstrual cycle: Secondary | ICD-10-CM

## 2013-11-03 MED ORDER — NORETHINDRONE 0.35 MG PO TABS: 1.0000 | ORAL_TABLET | Freq: Every day | ORAL | Status: DC

## 2013-11-03 NOTE — Telephone Encounter (Signed)
See next phone encounters.  Routing to provider for final review. Patient agreeable to disposition. Will close encounter   

## 2013-11-03 NOTE — Progress Notes (Signed)
Patient ID: Janet [name], female   DOB: [birth year], 38 y.o.   MRN: [id number] GYNECOLOGY  VISIT   HPI: 38 y.o.   Married  African American  Female, Status post BTL [phone] with Patient's last menstrual period was 10/21/2013.   here for consult to discuss ultrasound results.  Patient started birth control pills as treatment for prolonged uterine bleeding. Was prescribed Micronor and received Microgestin from the pharmacy. Patient has developed nausea and pelvic pain.  Went to the ER at [facility] for evaluation.  Received Toradol and Rx for Vicodin.  Returned to hospital on another date and had pelvic ultrasound showing no fibroids, and mixed echogenicity in the endometrial cavity suspicious for clot.   Currently not taking her combined oral contraceptive pills.   Asking for a note to take to the pharmacy regarding her intolerance of her pills so that she can be refunded for them.   GYNECOLOGIC HISTORY: Patient's last menstrual period was 10/21/2013. Contraception:  OCP's-- Microgestin Menopausal hormone therapy: n/a        OB History   Grav Para Term Preterm Abortions TAB SAB Ect Mult Living   Patient Active Problem List   Diagnosis Date Noted  . Pre-employment examination 04/19/2013  . Back pain 11/25/2012  . Abdominal tenderness 11/25/2012  . Dyspnea 04/25/2011  . Palpitations 04/25/2011  . Paresthesia 12/30/2010  . Preventative health care 12/30/2010  . Right carpal tunnel syndrome 12/30/2010  . HYPERSOMNIA 04/10/2010  . Cough 03/05/2009  . LEUKOPENIA, MILD 09/21/2008  . FATIGUE 01/19/2008  . HYPERTHYROIDISM 08/03/2006    Past Medical History  Diagnosis Date  . HYPERTHYROIDISM 08/03/2006  . HYPERSOMNIA 04/10/2010    Past Surgical History  Procedure Laterality Date  . Cesarean section      x3  . Dilation and curettage of uterus      Current Outpatient Prescriptions  Medication Sig Dispense Refill  . ibuprofen (ADVIL,MOTRIN)  800 MG tablet Take 1 tablet (800 mg total) by mouth 3 (three) times daily.  21 tablet  0  . methimazole (TAPAZOLE) 10 MG tablet Take 1 tablet (10 mg total) by mouth daily.  30 tablet  6  . norethindrone-ethinyl estradiol (MICROGESTIN,JUNEL,LOESTRIN) 1-20 MG-MCG tablet Take 1 tablet by mouth daily.  1 Package  0   No current facility-administered medications for this visit.     ALLERGIES: Sulfa antibiotics  Family History  Problem Relation Age of Onset  . Hypertension Mother   . Hyperlipidemia Mother   . Sleep apnea Mother   . Thyroid disease Mother   . Stroke Mother   . Cancer Other     Aunt with Cancer- unknown type  . Lupus Other     Uncle  . Cancer Other     Lung Cancer-Uncle    History   Social History  . Marital Status: Married    Spouse Name: N/A    Number of Children: 3  . Years of Education: N/A   Occupational History  . Not on file.   Social History Main Topics  . Smoking status: Never Smoker   . Smokeless tobacco: Never Used  . Alcohol Use: No  . Drug Use: No  . Sexual Activity: Yes    Partners: Male    Birth Control/ Protection: Surgical     Comment: Tubal; on BCP for heavy bleeding   Other Topics Concern  . 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  PHYSICAL EXAMINATION:    BP 110/60  Pulse 104  Ht 5' 7.5" (1.715 m)  Wt 172 lb 6.4 oz (78.2 kg)  BMI 26.59 kg/m2  LMP 10/21/2013     General appearance: alert, cooperative and appears stated age  ASSESSMENT  Abnormal uterine bleeding.  Nausea with combined oral contraceptives. Pelvic pain - recent.  Status post BTL.  PLAN  Discussed options of progesterone only OCP, Mirena IUD, endometrial ablation, and hysterectomy. Will now really begin progesterone only oral contraceptives.  She understands that this pill is taken every day of the month with no placebo pills. Return in 3 months for recheck.  If irregular or heavy bleeding  occur, will proceed with sonohysterogram.  Patient understands this plan.     An After Visit Summary was printed and given to the patient.  _25_____ minutes face to face time of which over 50% was spent in counseling.

## 2013-11-03 NOTE — Patient Instructions (Signed)
Norethindrone tablets (contraception) What is this medicine? NORETHINDRONE (nor eth IN drone) is an oral contraceptive. The product contains a female hormone known as a progestin. It is used to prevent pregnancy. This medicine may be used for other purposes; ask your health care provider or pharmacist if you have questions. COMMON BRAND NAME(S): Camila, Deblitane 28-Day, Errin, Heather, Jencycla, Jolivette, Lyza, Nor-QD, Nora-BE, Norlyroc, Ortho Micronor, Sharobel 28-Day What should I tell my health care provider before I take this medicine? They need to know if you have any of these conditions: -blood vessel disease or blood clots -breast, cervical, or vaginal cancer -diabetes -heart disease -kidney disease -liver disease -mental depression -migraine -seizures -stroke -vaginal bleeding -an unusual or allergic reaction to norethindrone, other medicines, foods, dyes, or preservatives -pregnant or trying to get pregnant -breast-feeding How should I use this medicine? Take this medicine by mouth with a glass of water. You may take it with or without food. Follow the directions on the prescription label. Take this medicine at the same time each day and in the order directed on the package. Do not take your medicine more often than directed. Contact your pediatrician regarding the use of this medicine in children. Special care may be needed. This medicine has been used in female children who have started having menstrual periods. A patient package insert for the product will be given with each prescription and refill. Read this sheet carefully each time. The sheet may change frequently. Overdosage: If you think you have taken too much of this medicine contact a poison control center or emergency room at once. NOTE: This medicine is only for you. Do not share this medicine with others. What if I miss a dose? Try not to miss a dose. Every time you miss a dose or take a dose late your chance of  pregnancy increases. When 1 pill is missed (even if only 3 hours late), take the missed pill as soon as possible and continue taking a pill each day at the regular time (use a back up method of birth control for the next 48 hours). If more than 1 dose is missed, use an additional birth control method for the rest of your pill pack until menses occurs. Contact your health care professional if more than 1 dose has been missed. What may interact with this medicine? Do not take this medicine with any of the following medications: -amprenavir or fosamprenavir -bosentan This medicine may also interact with the following medications: -antibiotics or medicines for infections, especially rifampin, rifabutin, rifapentine, and griseofulvin, and possibly penicillins or tetracyclines -aprepitant -barbiturate medicines, such as phenobarbital -carbamazepine -felbamate -modafinil -oxcarbazepine -phenytoin -ritonavir or other medicines for HIV infection or AIDS -St. John's wort -topiramate This list may not describe all possible interactions. Give your health care provider a list of all the medicines, herbs, non-prescription drugs, or dietary supplements you use. Also tell them if you smoke, drink alcohol, or use illegal drugs. Some items may interact with your medicine. What should I watch for while using this medicine? Visit your doctor or health care professional for regular checks on your progress. You will need a regular breast and pelvic exam and Pap smear while on this medicine. Use an additional method of birth control during the first cycle that you take these tablets. If you have any reason to think you are pregnant, stop taking this medicine right away and contact your doctor or health care professional. If you are taking this medicine for hormone related problems, it   may take several cycles of use to see improvement in your condition. This medicine does not protect you against HIV infection (AIDS)  or any other sexually transmitted diseases. What side effects may I notice from receiving this medicine? Side effects that you should report to your doctor or health care professional as soon as possible: -breast tenderness or discharge -pain in the abdomen, chest, groin or leg -severe headache -skin rash, itching, or hives -sudden shortness of breath -unusually weak or tired -vision or speech problems -yellowing of skin or eyes Side effects that usually do not require medical attention (report to your doctor or health care professional if they continue or are bothersome): -changes in sexual desire -change in menstrual flow -facial hair growth -fluid retention and swelling -headache -irritability -nausea -weight gain or loss This list may not describe all possible side effects. Call your doctor for medical advice about side effects. You may report side effects to FDA at 1-800-FDA-1088. Where should I keep my medicine? Keep out of the reach of children. Store at room temperature between 15 and 30 degrees C (59 and 86 degrees F). Throw away any unused medicine after the expiration date. NOTE: This sheet is a summary. It may not cover all possible information. If you have questions about this medicine, talk to your doctor, pharmacist, or health care provider.  2015, Elsevier/Gold Standard. (2011-10-17 16:41:35)  

## 2013-12-08 ENCOUNTER — Telehealth: Payer: Self-pay | Admitting: Endocrinology

## 2013-12-08 MED ORDER — METHIMAZOLE 10 MG PO TABS: 10.0000 mg | ORAL_TABLET | Freq: Every day | ORAL | Status: DC

## 2013-12-08 NOTE — Telephone Encounter (Signed)
Rx sent to pharmacy   

## 2013-12-08 NOTE — Telephone Encounter (Signed)
Patient would like her medicine sent to [facility] on [address], need refill of methimzole 10 mg.

## 2013-12-12 ENCOUNTER — Encounter: Payer: Self-pay | Admitting: Obstetrics and Gynecology

## 2014-01-23 ENCOUNTER — Telehealth: Payer: Self-pay | Admitting: Obstetrics and Gynecology

## 2014-01-23 ENCOUNTER — Ambulatory Visit: Payer: PRIVATE HEALTH INSURANCE | Admitting: Obstetrics and Gynecology

## 2014-01-23 NOTE — Telephone Encounter (Signed)
Patient called and cancelled her appointment for this afternoon due not starting the RX for Micronor until about 12/25/13. She says she was supossed to come for a recheck three month after starting the RX. She rescheduled to 03/27/14. The patient was reminded to give our office 24 hours notice for cancelled appointments. She said she tried to call Friday 01/20/14 but the phones were not on yet. She is now aware she may leave a message after hours. No missed appointment fee charged as the appointment was not needed yet. FYI only.

## 2014-01-23 NOTE — Telephone Encounter (Signed)
Thank you for the update.  I will close the encounter. 

## 2014-03-20 ENCOUNTER — Other Ambulatory Visit: Payer: Self-pay | Admitting: Obstetrics and Gynecology

## 2014-03-20 NOTE — Telephone Encounter (Addendum)
Medication refill request: Camila 0.35 mg  Last AEX:  06/09/13 with Dr. Edward JollySilva Next AEX: 06/23/14 with Dr. Edward JollySilva Last MMG (if hormonal medication request): N/A Refill authorized: #28 only  According to chart patient was put on Camila 11/03/13 patient was to come back in 3 months for a recheck. Patient called us back 01/23/14  to say she hadn't started Camila until 12/25/13. Patient is scheduled for recheck on 04/12/14. I called patient she says she is needing one pack to last her until her appointment. She said she still wanted to stay on this until her appointment and will discuss it more with Dr. Edward JollySilva when she comes in, please advise.

## 2014-03-21 NOTE — Telephone Encounter (Signed)
I will approve one month only of the [place].  Patient does need to come in for her recheck appointment, which is scheduled for March.  I have been following her for abnormal uterine bleeding.   Thanks!

## 2014-03-21 NOTE — Telephone Encounter (Signed)
Patient notified that rx has been sent in and aware to keep recheck appointment for 04/12/14 with Dr. Edward JollySilva.

## 2014-03-27 ENCOUNTER — Ambulatory Visit: Payer: PRIVATE HEALTH INSURANCE | Admitting: Obstetrics and Gynecology

## 2014-04-12 ENCOUNTER — Ambulatory Visit: Payer: PRIVATE HEALTH INSURANCE | Admitting: Obstetrics and Gynecology

## 2014-04-20 ENCOUNTER — Telehealth: Payer: Self-pay | Admitting: Obstetrics and Gynecology

## 2014-04-20 MED ORDER — NORETHINDRONE 0.35 MG PO TABS: 1.0000 | ORAL_TABLET | Freq: Every day | ORAL | Status: DC

## 2014-04-20 NOTE — Telephone Encounter (Signed)
Spoke with patient. Patient feels she needs follow up with Dr. Edward JollySilva. Cancelled appointment for tomorrow with Verner Choleborah S. Leonard CNM.  She started Progesterone only birth control 12/2013. States that her cycles have been progressively shortening and getting lighter and lighter, last month did not have cycle at all. Patient is s/p BTL. Patient wondering if this is normal to not have a cycle. Advised patient that some patients do not have cycle with progesterone only pill and this is okay. Recommended continuing pills each day as directed and to follow up with Dr. Edward JollySilva as scheduled. Appointment scheduled for 05/04/14 at 1445 with Dr. Edward JollySilva and 1 month of Camila placed to [facility] so that patient does not run out of rx prior to appointment. Patient is advised to return call with any concerns prior to follow up appointment and she is agreeable. Routing to provider for final review. Patient agreeable to disposition. Will close encounter.

## 2014-04-20 NOTE — Telephone Encounter (Signed)
Pt scheduled to see D.Darcel BayleyLeonard tomorrow for a follow up. Pt called stating she needs to see Dr Edward JollySilva instead. She normally sees Dr Edward JollySilva and states shes more comfortable discussing follow up material with her. Pt states she has questions regarding her medications. Pt states if not available to see her tomorrow shed rather cancel her appointment and reschedule for another day.

## 2014-04-21 ENCOUNTER — Ambulatory Visit: Payer: PRIVATE HEALTH INSURANCE | Admitting: Certified Nurse Midwife

## 2014-04-29 ENCOUNTER — Other Ambulatory Visit: Payer: Self-pay | Admitting: Endocrinology

## 2014-05-01 NOTE — Telephone Encounter (Signed)
Rx sent to pharmacy,.  

## 2014-05-01 NOTE — Telephone Encounter (Signed)
Please advise if ok to refill rx. Pt was last seen in December of 2014.  Thanks!

## 2014-05-01 NOTE — Telephone Encounter (Signed)
Please refill x 1 Ov is due  

## 2014-05-04 ENCOUNTER — Ambulatory Visit (INDEPENDENT_AMBULATORY_CARE_PROVIDER_SITE_OTHER): Payer: PRIVATE HEALTH INSURANCE | Admitting: Obstetrics and Gynecology

## 2014-05-04 ENCOUNTER — Encounter: Payer: Self-pay | Admitting: Obstetrics and Gynecology

## 2014-05-04 VITALS — BP 110/60 | HR 88 | Ht 67.5 in | Wt 176.0 lb

## 2014-05-04 DIAGNOSIS — N921 Excessive and frequent menstruation with irregular cycle: Secondary | ICD-10-CM

## 2014-05-04 NOTE — Progress Notes (Signed)
Patient ID: Janet [name], female   DOB: December 12, 1975, 39 y.o.   MRN: [id number] GYNECOLOGY  VISIT   HPI: 39 y.o.   Married  [place]  female   [phone] with Patient's last menstrual period was 04/23/2014 (exact date).   Status post BTL. here for follow up visit after beginning progesterone only oral contraceptives.  Patient states still having irregular bleeding and abdominal cramping. Currently taking Camilla.  Has lingering menses.  Bleeds for 3 weeks out of the month.  Cramping has returned.  It did stop for some period of time.   No late of missed pills.  Uses an alarm.   Has used combined oral contraceptives and had nausea and pelvic pain.   Pelvic ultrasound done 10/28/13  CLINICAL DATA:  Bleeding.  Abdominal pain.   EXAM: TRANSABDOMINAL AND TRANSVAGINAL ULTRASOUND OF PELVIS   TECHNIQUE: Both transabdominal and transvaginal ultrasound examinations of the pelvis were performed. Transabdominal technique was performed for global imaging of the pelvis including uterus, ovaries, adnexal regions, and pelvic cul-de-sac. It was necessary to proceed with endovaginal exam following the transabdominal exam to visualize the endometrium.   COMPARISON:  None   FINDINGS: Uterus   Measurements: 10.3 x 4.1 x 6.1 cm. No fibroids or other mass visualized.   Endometrium   Endometrial cavity is distended up to 8 mm and contains mixed echogenicity material. The majority of this material appears to be mobile on cine images.   Right ovary   Measurements: 3.1 x 1.5 x 2.1 cm. Normal appearance/no adnexal mass.   Left ovary   Measurements: 2.6 x 1.6 x 1.8 cm. Normal appearance/no adnexal mass.   Other findings   No free fluid.   IMPRESSION: The endometrial canal is distended up to 8 mm and contains mixed echogenicity material. The majority of this material appears to be mobile and may represent a mixture of fluid and blood products. Endometrial abnormality such as  thickening or underlying mass are not entirely excluded on this exam given the mixed echogenicity endometrial material. Consider short-term followup ultrasound in 6-8 weeks for repeat evaluation.   If bleeding remains unresponsive to hormonal or medical therapy, sonohysterogram should be considered for focal lesion work-up. (Ref: Radiological Reasoning: Algorithmic Workup of Abnormal Vaginal Bleeding with Endovaginal Sonography and Sonohysterography. AJR 2008; 329:J18-84)     Electronically Signed   By: Annia Belt M.D.   On: 10/28/2013 15:18  GYNECOLOGIC HISTORY: Patient's last menstrual period was 04/23/2014 (exact date). Contraception:  Tubal/OCPs  Menopausal hormone therapy: n/a        OB History    Gravida Para Term Preterm AB TAB SAB Ectopic Multiple Living   Patient Active Problem List   Diagnosis Date Noted  . Pre-employment examination 04/19/2013  . Back pain 11/25/2012  . Abdominal tenderness 11/25/2012  . Dyspnea 04/25/2011  . Palpitations 04/25/2011  . Paresthesia 12/30/2010  . Preventative health care 12/30/2010  . Right carpal tunnel syndrome 12/30/2010  . HYPERSOMNIA 04/10/2010  . Cough 03/05/2009  . LEUKOPENIA, MILD 09/21/2008  . FATIGUE 01/19/2008  . HYPERTHYROIDISM 08/03/2006    Past Medical History  Diagnosis Date  . HYPERTHYROIDISM 08/03/2006  . HYPERSOMNIA 04/10/2010    Past Surgical History  Procedure Laterality Date  . Cesarean section      x3  . Dilation and curettage of uterus      Current Outpatient Prescriptions  Medication Sig Dispense  Refill  . ibuprofen (ADVIL,MOTRIN) 800 MG tablet Take 1 tablet (800 mg total) by mouth 3 (three) times daily. 21 tablet 0  . methimazole (TAPAZOLE) 10 MG tablet Take 1 tablet by mouth daily. APPOINTMENT NEEDED FOR FURTHER REFILLS 30 tablet 0  . norethindrone (CAMILA) 0.35 MG tablet Take 1 tablet (0.35 mg total) by mouth daily. 1 Package 0   No current facility-administered  medications for this visit.     ALLERGIES: Sulfa antibiotics  Family History  Problem Relation Age of Onset  . Hypertension Mother   . Hyperlipidemia Mother   . Sleep apnea Mother   . Thyroid disease Mother   . Stroke Mother   . Cancer Other     Aunt with Cancer- unknown type  . Lupus Other     Uncle  . Cancer Other     Lung Cancer-Uncle    History   Social History  . Marital Status: Married    Spouse Name: N/A  . Number of Children: 3  . Years of Education: N/A   Occupational History  . Not on file.   Social History Main Topics  . Smoking status: Never Smoker   . Smokeless tobacco: Never Used  . Alcohol Use: No  . Drug Use: No  . Sexual Activity:    Partners: Male    Birth Control/ Protection: Surgical     Comment: Tubal; on BCP for heavy bleeding   Other Topics Concern  . 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  PHYSICAL EXAMINATION:    BP 110/60 mmHg  Pulse 88  Ht 5' 7.5" (1.715 m)  Wt 176 lb (79.833 kg)  BMI 27.14 kg/m2  LMP 04/23/2014 (Exact Date)     General appearance: alert, cooperative and appears stated age  ASSESSMENT  Status post BTL. Metrorrhagia.  On Camilla. Prior pelvic ultrasound suggesting possible endometrial mass versus clot.  PLAN  Return for sonohysterogram and endometrial biopsy.  OK to stop Micronor at the end of this pack.  Briefly discussed Depo Provera, Mirena IUD, endometrial ablation, and hysterectomy.  Can check Hgb at ultrasound visit.   An After Visit Summary was printed and given to the patient.  ___25___ minutes face to face time of which over 50% was spent in counseling.

## 2014-05-09 ENCOUNTER — Telehealth: Payer: Self-pay | Admitting: Obstetrics and Gynecology

## 2014-05-09 NOTE — Telephone Encounter (Signed)
Call to patient. Advised of benefit quote received for SHGM/EMB.  Patient declines scheduling. Staff message sent to provider.

## 2014-05-10 NOTE — Telephone Encounter (Signed)
Spoke with patient. Advised patient of message as seen below from Dr.Silva. Patient states that she is unable to pay for EMB appointment at this time. Patient would like to contact her insurance regarding her deductible and return call to schedule. Advised will let Dr.Silva know and will follow up with her in regards to scheduling. Patient is agreeable.

## 2014-05-10 NOTE — Telephone Encounter (Signed)
-----  Message from Nunzio Cobbs, MD sent at 05/09/2014  5:48 PM EDT ----- Regarding: FW: SHGM/EMB referral FYI Please contact patient regarding her abnormal bleeding and cramping. She has not tolerated combined oral contraceptives, and she has not improved with progesterone only contraceptive pills.   I recommend that at a minimum, she proceed forward with an endometrial biopsy.   She is declining a sonohysterogram.   Thank you,   Janet [name]  ----- Message -----    From: Laqueta Due    Sent: 05/09/2014  10:52 AM      To: Brook Oletta Lamas, MD Subject: SHGM/EMB referral FYI                          Good morning,  I contacted this patient regarding benefits and scheduling of SHGM/EMB. Patient declined scheduling due to the out of pocket costs that she will be responsible for.  She will be responsible to pay $1084.54 because she has a $1000 deductible in which she has met 0 towards.   Thank you, Gabriel Cirri

## 2014-05-15 ENCOUNTER — Other Ambulatory Visit: Payer: Self-pay | Admitting: Obstetrics and Gynecology

## 2014-05-15 NOTE — Telephone Encounter (Signed)
Medication refill request: Janet [name]  Last AEX:  06/09/13 with BS  Next AEX: 06/23/14 with BS  Last MMG (if hormonal medication request): N/A Refill authorized: #[id number] rfs, please advise.

## 2014-05-15 NOTE — Telephone Encounter (Signed)
I am trying to work with the patient to help her with her bleeding and cramping.   She has not tolerated combined oral contraceptive pills.  She had nausea and ongoing pelvic pain.  She is having ongoing bleeding with progesterone only OCPs.  A [facility] suggested either intrauterine clot or an endometrial mass.  No fibroids were seen.   Options for the patient are: 1)  Stop all hormonal birth control and see what her bleeding profile is and her pain status.  2)  Proceed with a sonohysterogram and endometrial biopsy or just an endometrial biopsy.  This option could be followed by an other treatment choice such as a Mirena IUD which may be an excellent choice to treat pain and bleeding and minimize side effects.  Without proceeding forward with some objective evaluation to try to rule out intrauterine pathology, I am not able to offer another treatment option for her.   Please let me know what the patient would like to do.   Cc- [facility] Pc[facility]aitlyn Hines

## 2014-05-15 NOTE — Telephone Encounter (Signed)
Routed to Dr. Silva. 

## 2014-05-19 NOTE — Telephone Encounter (Signed)
Encounter closed

## 2014-05-19 NOTE — Telephone Encounter (Signed)
Call to patient, left message to call back. 

## 2014-05-19 NOTE — Telephone Encounter (Signed)
I agree with this plan.  The patient will now come off her birth control pills and we will see how her cycle are.  I will see her for her annual exam in May.

## 2014-05-19 NOTE — Telephone Encounter (Signed)
Patient returned call. Discussed options as directed by Dr Edward JollySilva.  Patient states she will plan to call back in may to schedule but is unable to proceed right now. She will be off birth control pills at end of this back. Has had BTSP. Has annual exam scheduled with Dr Edward JollySilva for 06-26-14. She will call back if pain or bleeding worsens before that appointment or is able to schedule endometrial biopsy.  Martie LeeSabrina, please place order on deferred list till 06-26-14 and can update at that time.  Dr Edward JollySilva, is this acceptable?

## 2014-05-26 ENCOUNTER — Telehealth: Payer: Self-pay | Admitting: Obstetrics and Gynecology

## 2014-05-26 NOTE — Telephone Encounter (Signed)
LMTCB about canceled appointment °

## 2014-06-02 ENCOUNTER — Ambulatory Visit (INDEPENDENT_AMBULATORY_CARE_PROVIDER_SITE_OTHER): Payer: PRIVATE HEALTH INSURANCE | Admitting: Endocrinology

## 2014-06-02 ENCOUNTER — Encounter: Payer: Self-pay | Admitting: Endocrinology

## 2014-06-02 VITALS — BP 122/76 | HR 84 | Temp 98.5°F | Ht 67.5 in | Wt 176.0 lb

## 2014-06-02 DIAGNOSIS — E0580 Other thyrotoxicosis without thyrotoxic crisis or storm: Secondary | ICD-10-CM

## 2014-06-02 LAB — TSH: TSH: 1.69 u[IU]/mL (ref 0.35–4.50)

## 2014-06-02 LAB — T4, FREE: Free T4: 0.91 ng/dL (ref 0.60–1.60)

## 2014-06-02 NOTE — Patient Instructions (Signed)
A thyroid blood test is requested for you today.  We'll contact you with results.   if ever you have fever while taking methimazole, stop it and call us, because of the risk of a rare side-effect.   Please come back for a follow-up appointment in 6 months.   

## 2014-06-02 NOTE — Progress Notes (Signed)
   Subjective:    Patient ID: Janet [name], female    DOB: 10-01-1975, 39 y.o.   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  She has chosen to continue the methimazole indefinitely, as she cannot be isolated from her children. She has had tubal ligation.  She takes tapazole 10 mg qd. pt reports fatigue and weight gain.  Pt says she misses the tapazole approx once per week Past Medical History  Diagnosis Date  . HYPERTHYROIDISM 08/03/2006  . HYPERSOMNIA 04/10/2010    Past Surgical History  Procedure Laterality Date  . Cesarean section      x3  . Dilation and curettage of uterus    . Tubal ligation      History   Social History  . Marital Status: Married    Spouse Name: N/A  . Number of Children: 3  . Years of Education: N/A   Occupational History  . Not on file.   Social History Main Topics  . Smoking status: Never Smoker   . Smokeless tobacco: Never Used  . Alcohol Use: No  . Drug Use: No  . Sexual Activity:    Partners: Male    Birth Control/ Protection: Surgical     Comment: Tubal; on BCP for heavy bleeding   Other Topics Concern  . 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 ibuprofen (ADVIL,MOTRIN) 800 MG tablet Take 1 tablet (800 mg total) by mouth 3 (three) times daily. 21 tablet 0  . methimazole (TAPAZOLE) 10 MG tablet Take 1 tablet by mouth daily. APPOINTMENT NEEDED FOR FURTHER REFILLS 30 tablet 0   No current facility-administered medications on file prior to visit.    Allergies  Allergen Reactions  . Sulfa Antibiotics Shortness Of Breath and Swelling    Family History  Problem Relation Age of Onset  . Hypertension Mother   . Hyperlipidemia Mother   . Sleep apnea Mother   . Thyroid disease Mother   . Stroke Mother   . Cancer Other     Aunt with Cancer- unknown  type  . Lupus Other     Uncle  . Cancer Other     Lung Cancer-Uncle    BP 122/76 mmHg  Pulse 84  Temp(Src) 98.5 F (36.9 C) (Oral)  Ht 5' 7.5" (1.715 m)  Wt 176 lb (79.833 kg)  BMI 27.14 kg/m2  SpO2 95%  LMP 04/23/2014 (Exact Date)    Review of Systems Denies fever.    Objective:   Physical Exam VITAL SIGNS:  See vs page GENERAL: no distress NECK: Thyroid is slightly and diffusely enlarged.  No thyroid nodule is palpable.  No palpable lymphadenopathy at the anterior neck. Skin: not diaphoretic Neuro: no tremor.  Lab Results  Component Value Date   TSH 1.69 06/02/2014       Assessment & Plan:  Hyperthyroidism: well-controlled.    Patient is advised the following: Patient Instructions  A thyroid blood test is requested for you today.  We'll contact you with results. if ever you have fever while taking methimazole, stop it and call us, because of the risk of a rare side-effect.   Please come back for a follow-up appointment in 6 months.    addendum: Please continue the same methimazole

## 2014-06-07 ENCOUNTER — Other Ambulatory Visit: Payer: Self-pay | Admitting: Endocrinology

## 2014-06-07 ENCOUNTER — Telehealth: Payer: Self-pay | Admitting: Endocrinology

## 2014-06-07 NOTE — Telephone Encounter (Signed)
Patient advised labs from 06/02/2014 were normal and to continue the same medicaition. Pt voiced understanding.

## 2014-06-07 NOTE — Telephone Encounter (Signed)
Patient is calling for the results of her lab work °

## 2014-06-23 ENCOUNTER — Ambulatory Visit: Payer: PRIVATE HEALTH INSURANCE | Admitting: Obstetrics and Gynecology

## 2014-07-14 ENCOUNTER — Other Ambulatory Visit: Payer: Self-pay | Admitting: Endocrinology

## 2014-08-03 ENCOUNTER — Ambulatory Visit (INDEPENDENT_AMBULATORY_CARE_PROVIDER_SITE_OTHER): Payer: PRIVATE HEALTH INSURANCE | Admitting: Obstetrics and Gynecology

## 2014-08-03 ENCOUNTER — Encounter: Payer: Self-pay | Admitting: Obstetrics and Gynecology

## 2014-08-03 ENCOUNTER — Telehealth: Payer: Self-pay | Admitting: Emergency Medicine

## 2014-08-03 VITALS — BP 100/70 | HR 80 | Resp 18 | Ht 66.5 in | Wt 181.0 lb

## 2014-08-03 DIAGNOSIS — N939 Abnormal uterine and vaginal bleeding, unspecified: Secondary | ICD-10-CM | POA: Diagnosis not present

## 2014-08-03 DIAGNOSIS — Z01419 Encounter for gynecological examination (general) (routine) without abnormal findings: Secondary | ICD-10-CM | POA: Diagnosis not present

## 2014-08-03 DIAGNOSIS — Z0000 Encounter for general adult medical examination without abnormal findings: Secondary | ICD-10-CM | POA: Diagnosis not present

## 2014-08-03 LAB — POCT URINALYSIS DIPSTICK
Bilirubin, UA: NEGATIVE
Blood, UA: NEGATIVE
Glucose, UA: NEGATIVE
KETONES UA: NEGATIVE
LEUKOCYTES UA: NEGATIVE
Nitrite, UA: NEGATIVE
Protein, UA: NEGATIVE
Urobilinogen, UA: NEGATIVE
pH, UA: 5

## 2014-08-03 NOTE — Telephone Encounter (Signed)
Called patient per Dr. Edward Jolly to schedule Endometrial biopsy.  Patient agreeable.  Scheduled for 08/30/14 (patient requests this date) for 1000. Pre-procedure instructions given. Motrin 800 mg po one hour before with food. Make sure to eat a meal and drink fluids prior to appointment.  Discussed cancellation procedure with patient, advised will need to cancel or reschedule within 72 business hours of appointment (3 business days) or will have $150.00 late cancellation fee placed to account.  Patient verbalized understanding of the appointment cancellation policy and pre-procedural instructions.  Routing to provider for final review. Patient agreeable to disposition. Will close encounter.

## 2014-08-03 NOTE — Progress Notes (Signed)
Patient ID: Janet [name], female   DOB: September 07, 1975, 39 y.o.   MRN: [id number] 39 y.o. [id number] Married [place] American female here for annual exam.   Off Micronor.  Menses with heavy bleeding just at the beginning of cycle. Bleeds for 8 - 9 days from beginning to end.  No intermenstrual bleeding Cramping is much.   Interested to have further evaluation of abnormal uterine bleeding. Did not have sonohysterogram or endometrial biopsy to date.  Earlier ultrasound at hospital suggested endometrial thickening.   Cannot tolerate combined oral contraceptives.  Micronor did not improve patient's bleeding profile.  Some breast pain in right breast. Started last week. Essentially went away.   TFTs normal in April 2016.   PCP:  Oliver Barre, MD   Patient's last menstrual period was 07/22/2014 (exact date).          Sexually active: Yes.   female The current method of family planning is tubal ligation.    Exercising: No.  Occasionally walking. Smoker:  no  Health Maintenance: Pap:  06-09-13 wnl:neg HR HPV History of abnormal Pap:  no MMG:  n/a Colonoscopy:  n/a BMD:   n/a  Result  n/a TDaP:  04/2013 Screening Labs:  Hb today: 12.2, Urine today: Neg   reports that she has never smoked. She has never used smokeless tobacco. She reports that she does not drink alcohol or use illicit drugs.  Past Medical History  Diagnosis Date  . HYPERTHYROIDISM 08/03/2006  . HYPERSOMNIA 04/10/2010    Past Surgical History  Procedure Laterality Date  . Cesarean section      x3  . Dilation and curettage of uterus    . Tubal ligation      Current Outpatient Prescriptions  Medication Sig Dispense Refill  . methimazole (TAPAZOLE) 10 MG tablet Take 1 tablet by mouth daily 30 tablet 3   No current facility-administered medications for this visit.    Family History  Problem Relation Age of Onset  . Hypertension Mother   . Hyperlipidemia Mother   . Sleep apnea Mother   . Thyroid disease Mother    . Stroke Mother   . Cancer Other     Aunt with Cancer- unknown type  . Lupus Other     Uncle  . Cancer Other     Lung Cancer-Uncle    ROS:  Pertinent items are noted in HPI.  Otherwise, a comprehensive ROS was negative.  Exam:   BP 100/70 mmHg  Pulse 80  Resp 18  Ht 5' 6.5" (1.689 m)  Wt 181 lb (82.101 kg)  BMI 28.78 kg/m2  LMP 07/22/2014 (Exact Date)    General appearance: alert, cooperative and appears stated age Head: Normocephalic, without obvious abnormality, atraumatic Neck: no adenopathy, supple, symmetrical, trachea midline and thyroid enlarged and nontender. Lungs: clear to auscultation bilaterally Breasts: normal appearance, no masses or tenderness, Inspection negative, No nipple retraction or dimpling, No nipple discharge or bleeding, No axillary or supraclavicular adenopathy Heart: regular rate and rhythm Abdomen: soft, non-tender; bowel sounds normal; no masses,  no organomegaly Extremities: extremities normal, atraumatic, no cyanosis or edema Skin: Skin color, texture, turgor normal. No rashes or lesions Lymph nodes: Cervical, supraclavicular, and axillary nodes normal. No abnormal inguinal nodes palpated Neurologic: Grossly normal  Pelvic: External genitalia:  no lesions              Urethra:  normal appearing urethra with no masses, tenderness or lesions  Bartholins and Skenes: normal                 Vagina: normal appearing vagina with normal color and discharge, no lesions              Cervix: no lesions and Mucousy dark blood from the os and staining the vaginal vault.              Pap taken: No. Bimanual Exam:  Uterus:  Small, anteverted, and slightly tender.              Adnexa: normal adnexa and no mass, fullness, tenderness              Rectovaginal: No..     Chaperone was present for exam.  Assessment:   Well woman visit with normal exam. Abnormal uterine bleeding  Plan: Yearly mammogram recommended after age 59.  Recommended  self breast exam.  Pap and HR HPV as above. Discussed Calcium, Vitamin D, regular exercise program including cardiovascular and weight bearing exercise. Labs performed.  No..   See orders. Refills given on medications.  No..  See orders. Return for endometrial biopsy.  Checking precert information for patient while she is in the office. Follow up annually and prn.     After visit summary provided.

## 2014-08-03 NOTE — Patient Instructions (Signed)

## 2014-08-04 LAB — HEMOGLOBIN, FINGERSTICK: Hemoglobin, fingerstick: 12.2 g/dL (ref 12.0–16.0)

## 2014-08-21 ENCOUNTER — Telehealth: Payer: Self-pay | Admitting: Obstetrics and Gynecology

## 2014-08-21 NOTE — Telephone Encounter (Signed)
Patient wants to know if she can reschedule her procedure for 09/01/14 instead of the 20th.

## 2014-08-21 NOTE — Telephone Encounter (Signed)
Spoke with patient. History of BTL. EMB rescheduled for 09/01/2014 at 10am with Dr.Silva. Patient is agreeable to date and time.  Routing to provider for final review. Patient agreeable to disposition. Will close encounter.   Patient aware provider will review message and nurse will return call if any additional advice or change of disposition.

## 2014-08-30 ENCOUNTER — Ambulatory Visit: Payer: PRIVATE HEALTH INSURANCE | Admitting: Obstetrics and Gynecology

## 2014-09-01 ENCOUNTER — Encounter: Payer: Self-pay | Admitting: Obstetrics and Gynecology

## 2014-09-01 ENCOUNTER — Ambulatory Visit (INDEPENDENT_AMBULATORY_CARE_PROVIDER_SITE_OTHER): Payer: PRIVATE HEALTH INSURANCE | Admitting: Obstetrics and Gynecology

## 2014-09-01 VITALS — BP 110/72 | HR 88 | Ht 66.5 in | Wt 178.8 lb

## 2014-09-01 DIAGNOSIS — N946 Dysmenorrhea, unspecified: Secondary | ICD-10-CM

## 2014-09-01 DIAGNOSIS — R938 Abnormal findings on diagnostic imaging of other specified body structures: Secondary | ICD-10-CM | POA: Diagnosis not present

## 2014-09-01 DIAGNOSIS — N921 Excessive and frequent menstruation with irregular cycle: Secondary | ICD-10-CM | POA: Diagnosis not present

## 2014-09-01 DIAGNOSIS — R9389 Abnormal findings on diagnostic imaging of other specified body structures: Secondary | ICD-10-CM

## 2014-09-01 NOTE — Progress Notes (Signed)
Subjective:     Patient ID: Janet [name], female   DOB: 05-31-1975, 39 y.o.   MRN: [id number]  HPI Patient is here for endometrial biopsy for dysmenorrhea, metrorrhagia, and thickened endometrium. Prior ultrasound at [facility]:  CLINICAL DATA: Bleeding. Abdominal pain.  EXAM: TRANSABDOMINAL AND TRANSVAGINAL ULTRASOUND OF PELVIS  TECHNIQUE: Both transabdominal and transvaginal ultrasound examinations of the pelvis were performed. Transabdominal technique was performed for global imaging of the pelvis including uterus, ovaries, adnexal regions, and pelvic cul-de-sac. It was necessary to proceed with endovaginal exam following the transabdominal exam to visualize the endometrium.  COMPARISON: None  FINDINGS: Uterus  Measurements: 10.3 x 4.1 x 6.1 cm. No fibroids or other mass visualized.  Endometrium  Endometrial cavity is distended up to 8 mm and contains mixed echogenicity material. The majority of this material appears to be mobile on cine images.  Right ovary  Measurements: 3.1 x 1.5 x 2.1 cm. Normal appearance/no adnexal mass.  Left ovary  Measurements: 2.6 x 1.6 x 1.8 cm. Normal appearance/no adnexal mass.  Other findings  No free fluid.  IMPRESSION: The endometrial canal is distended up to 8 mm and contains mixed echogenicity material. The majority of this material appears to be mobile and may represent a mixture of fluid and blood products. Endometrial abnormality such as thickening or underlying mass are not entirely excluded on this exam given the mixed echogenicity endometrial material. Consider short-term followup ultrasound in 6-8 weeks for repeat evaluation.  If bleeding remains unresponsive to hormonal or medical therapy, sonohysterogram should be considered for focal lesion work-up. (Ref: Radiological Reasoning: Algorithmic Workup of Abnormal Vaginal Bleeding with Endovaginal Sonography and Sonohysterography.  AJR 2008; 409:W11-91)   Electronically Signed  By: Annia Belt M.D.  On: 10/28/2013 15:18   Patient has not tolerated combined oral contraceptives and Micronor has not controlled her symptoms.   Review of Systems LMP: 08/23/14 Contraception: Tubal Ligation    Objective:   Physical Exam  Procedure - endometrial biopsy Consent performed. Speculum place in vagina.  Sterile prep of cervix with  Hibiclens Tenaculum to anterior cervical lip. Paracervical block with 10 cc 1% lidocaine _______________ yes.  Lot number [id number], Expiration 02/2015. Pipelle placed to  Almost 9        cm without difficulty twice. Tissue obtained and sent to pathology. Speculum removed.  No complications. Minimal EBL.    Assessment:      Metrorrhagia.  Dysmenorrhea.  Thickened endometrium by prior ultrasound.  Status post BTL.     Plan:     Follow up EMB. Instructions and precautions given.  Discussion of endometrial ablation versus Mirena IUD for treatment options depending on the EMB results.  Handout on Mirena IUD to patient.  Declines hysterectomy.     After visit summary to patient.

## 2014-09-05 LAB — IPS OTHER TISSUE BIOPSY

## 2014-09-07 ENCOUNTER — Telehealth: Payer: Self-pay

## 2014-09-07 DIAGNOSIS — N946 Dysmenorrhea, unspecified: Secondary | ICD-10-CM

## 2014-09-07 DIAGNOSIS — N939 Abnormal uterine and vaginal bleeding, unspecified: Secondary | ICD-10-CM

## 2014-09-07 NOTE — Telephone Encounter (Signed)
Spoke with patient. Advised of message and results as seen below from Dr.Silva. Patient is agreeable and verbalizes understanding. Patient would like to know OOP cost for Mirena insertion before making a decision. Advised I will place order for this to be precerted with our insurance and billing department. Advised once this has been performed she will be notified of cost and if she would like to proceed at that time we will schedule accordingly. Patient is agreeable.  Cc: Braxton Feathers  Routing to provider for final review. Patient agreeable to disposition. Will close encounter.   Patient aware provider will review message and nurse will return call if any additional advice or change of disposition.

## 2014-09-07 NOTE — Telephone Encounter (Signed)
Left message to call [name] at [phone]. 

## 2014-09-07 NOTE — Telephone Encounter (Signed)
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  This is a normal result. Patient is a candidate for the Mirena or an endometrial ablation if she would like to avoid medication (Mirena). The Mirena is a better choice to control painful menses and abnormal bleeding.  Cc- Claudette Laws

## 2014-09-07 NOTE — Telephone Encounter (Signed)
Returning a call to [name]. °

## 2014-10-05 ENCOUNTER — Telehealth: Payer: Self-pay | Admitting: Obstetrics and Gynecology

## 2014-10-05 NOTE — Telephone Encounter (Signed)
Called patient to review benefit for IUD. Patient has concerns. See guarantor note. Routed to [facility] for review. Separate staff message to [place]

## 2014-10-13 ENCOUNTER — Encounter: Payer: Self-pay | Admitting: *Deleted

## 2014-10-13 ENCOUNTER — Telehealth: Payer: Self-pay | Admitting: Endocrinology

## 2014-10-13 NOTE — Telephone Encounter (Signed)
Patient's insurance plan does not cover IUD independent of diagnosis. Letter to your office for review. Can attempt insurance appeal. Patient has had BTSP and has failed oral estrogen and progesterone only pills.      Call to patient and updated on attempt to appeal appeal. Also offered potential for Liletta as cost saving option if patient chose to self pay. Patient states this would still be cost prohibitive.  She is appreciative of our efforts. States she would like to know if Dr Edward Jolly has any other option for a different pill in the meantime?  If IUD not approved, she will scheduled consult to discuss options for treatment while still trying to avoid hysterectomy.    Please advise.

## 2014-10-13 NOTE — Telephone Encounter (Signed)
Patient would like to know if she can take a multi-vitamin while taking her thyroid medication   Please advise patient    Thank you

## 2014-10-13 NOTE — Telephone Encounter (Signed)
I contacted the pt and advised she could take a mulitivitamin, but to wait 4 hours after taking the thyroid medication. Pt voiced understanding,.

## 2014-10-15 NOTE — Telephone Encounter (Signed)
A simple solution may be to have patient take Aygestin 2.5 mg daily.  This is similar to the Micronor but a higher dosage.  Her bleeding may be better controlled and pain improved.  Irregular bleeding and breast tenderness are still possibilities.   Another solution is an endometrial ablation, outpatient surgery to decrease bleeding.  This is not a primary operation for pain, however.

## 2014-10-19 MED ORDER — NORETHINDRONE ACETATE 5 MG PO TABS: 2.5000 mg | ORAL_TABLET | Freq: Every day | ORAL | Status: DC

## 2014-10-19 NOTE — Telephone Encounter (Signed)
Call to patient. She is given message from Dr. Edward Jolly and instructions on to start Aygestin 2.5 mg po daily. Patient aware of potential side effects with Aygestin and will call with any concerns.   She is advised of letter for appeal to her insurance for Mirena IUD. Advised will contact patient if receive authorization for Mirena. She is agreeable.   Keeping encounter open for return call from Nurse reviewer.

## 2014-10-19 NOTE — Telephone Encounter (Signed)
Spoke with patient. Message from Dr. Edward Jolly given. She states that she would like to try the option of Aygestin 2.5 mg daily. Advised patient will send message to Dr.Silva and return call.  Patient agreeable.

## 2014-10-19 NOTE — Telephone Encounter (Signed)
Ok for Aygestin 2.5 mg, one by mouth daily, for 3 months.  Please send to pharmacy of choice.  Needs a recheck with me in 3 months.  Please schedule this.   Just so patient is aware, I did send a letter to her insurance company asking for reconsideration of benefits for the Mirena.  This is not a guarantee that they will approve benefits for this but we can try!  Thank you.

## 2014-10-19 NOTE — Telephone Encounter (Signed)
Thank you for the update!

## 2014-10-19 NOTE — Telephone Encounter (Signed)
Received incoming call from Maralyn Sago at [facility] and [employer]. She is in receipt of appeal letter from Dr. Edward Jolly. She advises that since Mirena will be inserted in office, no pre-certification required.  Rayna Sexton code for Mirena [phone] and procedure insertion [id number]. She states she will have nurse reviewer return call if needs additional clinical information. She expects that a nurse named Ander Slade will return my call.   Maralyn Sago also requests that we call [employer] to discuss cost/coverage concerns.  Will wait for return call from nurse.

## 2014-10-20 NOTE — Telephone Encounter (Signed)
Heather with [employer] to let [place] know that the patient does not need any pre-cert. If she needs anything further she can contact the claims payor with [employer] at [phone].

## 2014-10-20 NOTE — Telephone Encounter (Signed)
Sent letter of Medical Necessity to [employer] at fax [phone]. Fax confirmation received.

## 2014-11-03 NOTE — Telephone Encounter (Signed)
Incoming call from Fritzi Mandes at [facility] and [employer]. Requesting additional records for review of medical necessity. Requesting office visit notes, H&P, Diagnostic imaging.   Phone:    [phone] Fax #:    [phone]  Faxed records at this time.  Update to Dr. Edward Jolly.

## 2014-11-03 NOTE — Telephone Encounter (Signed)
Thank you for the update!

## 2014-11-15 NOTE — Telephone Encounter (Signed)
Call to patient. Advised that appeal letter has been sent to insurance company. Requested clinical documentation was provided upon request. She may receive response from insurance company directly and if she receives positive response from [employer], please call to notify us and schedule IUD insertion. Patient states her last cycle improved so she has not yet started Aygestin.   Routing to provider for final review. Patient agreeable to disposition. Will close encounter.

## 2014-11-21 ENCOUNTER — Ambulatory Visit: Payer: PRIVATE HEALTH INSURANCE

## 2014-11-22 ENCOUNTER — Ambulatory Visit (INDEPENDENT_AMBULATORY_CARE_PROVIDER_SITE_OTHER): Payer: PRIVATE HEALTH INSURANCE

## 2014-11-22 DIAGNOSIS — Z111 Encounter for screening for respiratory tuberculosis: Secondary | ICD-10-CM | POA: Diagnosis not present

## 2014-11-24 LAB — TB SKIN TEST
Induration: 0 mm
TB SKIN TEST: NEGATIVE

## 2014-12-06 ENCOUNTER — Ambulatory Visit (INDEPENDENT_AMBULATORY_CARE_PROVIDER_SITE_OTHER): Payer: PRIVATE HEALTH INSURANCE

## 2014-12-06 DIAGNOSIS — Z111 Encounter for screening for respiratory tuberculosis: Secondary | ICD-10-CM

## 2014-12-08 ENCOUNTER — Ambulatory Visit (INDEPENDENT_AMBULATORY_CARE_PROVIDER_SITE_OTHER): Payer: PRIVATE HEALTH INSURANCE

## 2014-12-08 DIAGNOSIS — Z23 Encounter for immunization: Secondary | ICD-10-CM

## 2014-12-25 ENCOUNTER — Telehealth: Payer: Self-pay | Admitting: Endocrinology

## 2014-12-25 MED ORDER — METHIMAZOLE 10 MG PO TABS: ORAL_TABLET | ORAL | Status: DC

## 2014-12-25 NOTE — Telephone Encounter (Signed)
Rx sent per pt's request.  

## 2014-12-25 NOTE — Telephone Encounter (Signed)
Patient need refill of Methimazole, send to [facility] [address]

## 2015-01-18 ENCOUNTER — Telehealth: Payer: Self-pay | Admitting: Obstetrics and Gynecology

## 2015-01-18 NOTE — Telephone Encounter (Signed)
Encounter closed.   Thank you.  

## 2015-01-18 NOTE — Telephone Encounter (Signed)
Patient called and cancelled her appointment for: Follow up on Aygestin. She said, "I never started taking the medicine and I haven't had any problems so I don't think I need an appointment. Please ask the doctor if I still need this."

## 2015-01-18 NOTE — Telephone Encounter (Signed)
Called and left patient a detailed message (okay per Dpr on file) that she will not need to reschedule the appointment as long as she is doing well but to call with any concerns. Advised to keep AEX for next summer.  Okay to close encounter?

## 2015-01-18 NOTE — Telephone Encounter (Signed)
Does not need appointment for a recheck since not taking medication and doing well.   Keep patient in schedule for annual exam in summer.

## 2015-01-19 ENCOUNTER — Ambulatory Visit: Payer: PRIVATE HEALTH INSURANCE | Admitting: Obstetrics and Gynecology

## 2015-01-26 ENCOUNTER — Ambulatory Visit: Payer: PRIVATE HEALTH INSURANCE | Admitting: Endocrinology

## 2015-02-15 ENCOUNTER — Ambulatory Visit (INDEPENDENT_AMBULATORY_CARE_PROVIDER_SITE_OTHER): Payer: PRIVATE HEALTH INSURANCE | Admitting: Endocrinology

## 2015-02-15 ENCOUNTER — Encounter: Payer: Self-pay | Admitting: Endocrinology

## 2015-02-15 VITALS — BP 128/84 | HR 85 | Temp 98.0°F | Ht 66.5 in | Wt 179.0 lb

## 2015-02-15 DIAGNOSIS — Z0000 Encounter for general adult medical examination without abnormal findings: Secondary | ICD-10-CM | POA: Insufficient documentation

## 2015-02-15 DIAGNOSIS — E0580 Other thyrotoxicosis without thyrotoxic crisis or storm: Secondary | ICD-10-CM

## 2015-02-15 DIAGNOSIS — Z0189 Encounter for other specified special examinations: Secondary | ICD-10-CM | POA: Diagnosis not present

## 2015-02-15 LAB — URINALYSIS, ROUTINE W REFLEX MICROSCOPIC
BILIRUBIN URINE: NEGATIVE
HGB URINE DIPSTICK: NEGATIVE
Ketones, ur: NEGATIVE
Leukocytes, UA: NEGATIVE
NITRITE: NEGATIVE
RBC / HPF: NONE SEEN (ref 0–?)
Specific Gravity, Urine: 1.025 (ref 1.000–1.030)
Total Protein, Urine: NEGATIVE
URINE GLUCOSE: NEGATIVE
UROBILINOGEN UA: 0.2 (ref 0.0–1.0)
pH: 6 (ref 5.0–8.0)

## 2015-02-15 LAB — LIPID PANEL
CHOLESTEROL: 153 mg/dL (ref 0–200)
HDL: 54.1 mg/dL (ref >39.00)
LDL Cholesterol: 90 mg/dL (ref 0–99)
NonHDL: 98.79
Total CHOL/HDL Ratio: 3
Triglycerides: 46 mg/dL (ref 0.0–149.0)
VLDL: 9.2 mg/dL (ref 0.0–40.0)

## 2015-02-15 LAB — TSH: TSH: 2.75 u[IU]/mL (ref 0.35–4.50)

## 2015-02-15 MED ORDER — TRIAMCINOLONE ACETONIDE 0.1 % EX CREA: 1.0000 "application " | TOPICAL_CREAM | Freq: Three times a day (TID) | CUTANEOUS | Status: DC

## 2015-02-15 NOTE — Progress Notes (Signed)
Subjective:    Patient ID: Janet [name], female    DOB: February 03, 1976, 40 y.o.   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    Past Medical History  Diagnosis Date  . HYPERTHYROIDISM 08/03/2006  . HYPERSOMNIA 04/10/2010    Past Surgical History  Procedure Laterality Date  . Cesarean section      x3  . Dilation and curettage of uterus    . Tubal ligation      Social History   Social History  . Marital Status: Married    Spouse Name: N/A  . Number of Children: 3  . Years of Education: N/A   Occupational History  . Not on file.   Social History Main Topics  . Smoking status: Never Smoker   . Smokeless tobacco: Never Used  . Alcohol Use: No  . Drug Use: No  . Sexual Activity:    Partners: Male    Birth Control/ Protection: Surgical     Comment: Tubal; on BCP for heavy bleeding   Other Topics Concern  . 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 methimazole (TAPAZOLE) 10 MG tablet Take 1 tablet by mouth daily 30 tablet 1  . norethindrone (AYGESTIN) 5 MG tablet Take 0.5 tablets (2.5 mg total) by mouth daily. (Patient not taking: Reported on 02/15/2015) 15 tablet 3   No current facility-administered medications on file prior to visit.    Allergies  Allergen Reactions  . Sulfa Antibiotics Shortness Of Breath and Swelling    Family History  Problem Relation Age of Onset  . Hypertension Mother   . Hyperlipidemia Mother   . Sleep apnea Mother   . Thyroid disease Mother   . Stroke Mother   . Cancer Other     Aunt with Cancer- unknown type  . Lupus Other     Uncle  .  Cancer Other     Lung Cancer-Uncle    BP 128/84 mmHg  Pulse 85  Temp(Src) 98 F (36.7 C) (Oral)  Ht 5' 6.5" (1.689 m)  Wt 179 lb (81.194 kg)  BMI 28.46 kg/m2  SpO2 98%  Review of Systems She has malodorous urine, but no fever.      Objective:   Physical Exam VITAL SIGNS:  See vs page GENERAL: no distress NECK: There is no palpable thyroid enlargement.  No thyroid nodule is palpable.  No palpable lymphadenopathy at the anterior neck.  Skin: mild dyshidrosis of the posterior neck and back.    Lab Results  Component Value Date   TSH 2.75 02/15/2015      Assessment & Plan:  Hyperthyroidism: well-controlled.  Dyshidrosis, new.  malodorous urine: we'll check UA today.  Patient is advised the following: Patient Instructions  blood and urine tests are requested for you today.  We'll let you know about the results. if ever you have fever while taking methimazole, stop it and call us, because of the risk of a rare side-effect.   i have sent a prescription to your pharmacy, for the itching.   Please come back for a  follow-up appointment in 6 months.

## 2015-02-15 NOTE — Patient Instructions (Addendum)
blood and urine tests are requested for you today.  We'll let you know about the results. if ever you have fever while taking methimazole, stop it and call us, because of the risk of a rare side-effect.   i have sent a prescription to your pharmacy, for the itching.   Please come back for a follow-up appointment in 6 months.

## 2015-02-20 ENCOUNTER — Telehealth: Payer: Self-pay | Admitting: Endocrinology

## 2015-02-20 NOTE — Telephone Encounter (Signed)
I contacted the pt and advised of note below. Pt verbalized understanding.  

## 2015-02-20 NOTE — Telephone Encounter (Signed)
I contacted the pt. She wanted to know based of her urine test if she should be prescribed an antibiotic. Pt stated she feels like she has an infection of some sort. Please advise, Thanks!

## 2015-02-20 NOTE — Telephone Encounter (Signed)
Patient is calling for test results. °

## 2015-02-20 NOTE — Telephone Encounter (Signed)
please call patient: UA was normal. Please ask PCP about sxs

## 2015-03-08 ENCOUNTER — Other Ambulatory Visit: Payer: Self-pay | Admitting: Endocrinology

## 2015-03-09 ENCOUNTER — Ambulatory Visit (INDEPENDENT_AMBULATORY_CARE_PROVIDER_SITE_OTHER): Payer: PRIVATE HEALTH INSURANCE | Admitting: Obstetrics and Gynecology

## 2015-03-09 ENCOUNTER — Encounter: Payer: Self-pay | Admitting: Obstetrics and Gynecology

## 2015-03-09 VITALS — BP 124/80 | HR 70 | Ht 66.5 in | Wt 177.0 lb

## 2015-03-09 DIAGNOSIS — R8290 Unspecified abnormal findings in urine: Secondary | ICD-10-CM | POA: Diagnosis not present

## 2015-03-09 DIAGNOSIS — R350 Frequency of micturition: Secondary | ICD-10-CM

## 2015-03-09 DIAGNOSIS — R5383 Other fatigue: Secondary | ICD-10-CM

## 2015-03-09 LAB — COMPREHENSIVE METABOLIC PANEL
ALT: 23 U/L (ref 6–29)
AST: 19 U/L (ref 10–30)
Albumin: 4.1 g/dL (ref 3.6–5.1)
Alkaline Phosphatase: 65 U/L (ref 33–115)
BUN: 11 mg/dL (ref 7–25)
CO2: 24 mmol/L (ref 20–31)
Calcium: 9.1 mg/dL (ref 8.6–10.2)
Chloride: 100 mmol/L (ref 98–110)
Creat: 0.68 mg/dL (ref 0.50–1.10)
GLUCOSE: 74 mg/dL (ref 65–99)
Potassium: 4.2 mmol/L (ref 3.5–5.3)
SODIUM: 137 mmol/L (ref 135–146)
TOTAL PROTEIN: 7.2 g/dL (ref 6.1–8.1)
Total Bilirubin: 0.4 mg/dL (ref 0.2–1.2)

## 2015-03-09 LAB — POCT URINALYSIS DIPSTICK
BILIRUBIN UA: NEGATIVE
Glucose, UA: NEGATIVE
Ketones, UA: NEGATIVE
Leukocytes, UA: NEGATIVE
NITRITE UA: POSITIVE
PH UA: 6
Protein, UA: NEGATIVE
RBC UA: NEGATIVE
UROBILINOGEN UA: NEGATIVE

## 2015-03-09 LAB — URINALYSIS, MICROSCOPIC ONLY
CASTS: NONE SEEN [LPF]
Crystals: NONE SEEN [HPF]
RBC / HPF: NONE SEEN RBC/HPF (ref <=2)
YEAST: NONE SEEN [HPF]

## 2015-03-09 LAB — CBC
HCT: 40.1 % (ref 36.0–46.0)
Hemoglobin: 13.3 g/dL (ref 12.0–15.0)
MCH: 27.9 pg (ref 26.0–34.0)
MCHC: 33.2 g/dL (ref 30.0–36.0)
MCV: 84.2 fL (ref 78.0–100.0)
MPV: 9.3 fL (ref 8.6–12.4)
PLATELETS: 387 10*3/uL (ref 150–400)
RBC: 4.76 MIL/uL (ref 3.87–5.11)
RDW: 14.2 % (ref 11.5–15.5)
WBC: 6.4 10*3/uL (ref 4.0–10.5)

## 2015-03-09 NOTE — Progress Notes (Signed)
Patient ID: Janet [name], female   DOB: [birth year], 40 y.o.   MRN: [id number] GYNECOLOGY  VISIT   HPI: 40 y.o.   Married  [place]  female   [phone] with Patient's last menstrual period was 02/22/2015 (exact date).   here for urinary frequency, low back pain, and fatigue. Worried she has a UTI.  Back pain hurts with movement and resolves with emptying her bladder. No dysuria. No fever.  No nausea or vomiting.  No pelvic pain.   Urine dip with endocrinologist 02/15/15 - amorphous material noted and was cloudy.  No WBC.  No tx for UTI E Coli UTI in June 2015.   Menses are now better.  Some usual cramping.  Cycles are not so heavy.   TSH normal on 02/15/15.   Urine Dip: Pos. Nitrites  GYNECOLOGIC HISTORY: Patient's last menstrual period was 02/22/2015 (exact date). Contraception:Tubal Menopausal hormone therapy: n/a Last mammogram: n/a Last pap smear: 06-09-13 Neg:Neg HR HPV        OB History    Gravida Para Term Preterm AB TAB SAB Ectopic Multiple Living   Patient Active Problem List   Diagnosis Date Noted  . Wellness examination 02/15/2015  . Pre-employment examination 04/19/2013  . Back pain 11/25/2012  . Abdominal tenderness 11/25/2012  . Dyspnea 04/25/2011  . Palpitations 04/25/2011  . Paresthesia 12/30/2010  . Preventative health care 12/30/2010  . Right carpal tunnel syndrome 12/30/2010  . HYPERSOMNIA 04/10/2010  . Cough 03/05/2009  . LEUKOPENIA, MILD 09/21/2008  . FATIGUE 01/19/2008  . Thyrotoxicosis 08/03/2006    Past Medical History  Diagnosis Date  . HYPERTHYROIDISM 08/03/2006  . HYPERSOMNIA 04/10/2010    Past Surgical History  Procedure Laterality Date  . Cesarean section      x3  . Dilation and curettage of uterus    . Tubal ligation      Current Outpatient Prescriptions  Medication Sig Dispense Refill  . methimazole (TAPAZOLE) 10 MG tablet TAKE 1 TABLET BY MOUTH DAILY 30 tablet 1  . triamcinolone cream  (KENALOG) 0.1 % Apply 1 application topically 3 (three) times daily. As needed for itching 45 g 3   No current facility-administered medications for this visit.     ALLERGIES: Sulfa antibiotics  Family History  Problem Relation Age of Onset  . Hypertension Mother   . Hyperlipidemia Mother   . Sleep apnea Mother   . Thyroid disease Mother   . Stroke Mother   . Cancer Other     Aunt with Cancer- unknown type  . Lupus Other     Uncle  . Cancer Other     Lung Cancer-Uncle    Social History   Social History  . Marital Status: Married    Spouse Name: N/A  . Number of Children: 3  . Years of Education: N/A   Occupational History  . Not on file.   Social History Main Topics  . Smoking status: Never Smoker   . Smokeless tobacco: Never Used  . Alcohol Use: No  . Drug Use: No  . Sexual Activity:    Partners: Male    Birth Control/ Protection: Surgical     Comment: Tubal   Other Topics Concern  . 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  PHYSICAL EXAMINATION:    BP  124/80 mmHg  Pulse 70  Ht 5' 6.5" (1.689 m)  Wt 177 lb (80.287 kg)  BMI 28.14 kg/m2  LMP 02/22/2015 (Exact Date)    General appearance: alert, cooperative and appears stated age Head: Normocephalic, without obvious abnormality, atraumatic Lungs: clear to auscultation bilaterally Heart: regular rate and rhythm Abdomen: soft, non-tender; bowel sounds normal; no masses,  no organomegaly Back:  No CVA tenderness.  Chaperone was present for exam.  ASSESSMENT  Back pain.  Fatigue.  Hyperthyroidism.  Normal TSH. 1 Abnormal urine.  Possible UTI.  PLAN  Urine micro and culture.  Declines abx.  Prefers to wait for final urine culture.  Does not want to need to potentially switch abx. Hydrate well.  OK for AZO if needed.  Will check CBC and CMP to evaluate fatigue further.  To urgent care during weekend if has fever, nausea, vomiting,  increasing back pain.    An After Visit Summary was printed and given to the patient.  ___15___ minutes face to face time of which over 50% was spent in counseling.

## 2015-03-12 ENCOUNTER — Telehealth: Payer: Self-pay | Admitting: Emergency Medicine

## 2015-03-12 LAB — URINE CULTURE: Colony Count: >=100000

## 2015-03-12 MED ORDER — FLUCONAZOLE 150 MG PO TABS: ORAL_TABLET | ORAL | Status: DC

## 2015-03-12 MED ORDER — CEPHALEXIN 500 MG PO CAPS: 500.0000 mg | ORAL_CAPSULE | Freq: Four times a day (QID) | ORAL | Status: DC

## 2015-03-12 NOTE — Telephone Encounter (Signed)
-----   Message from Patton Salles, MD sent at 03/12/2015  9:42 AM EST ----- Please contact patient with final UC results showing staphylococcus.  I will treat with Keflex 500 mg po qid for 7 days.  Dispense 28, RF none.  I will also prescribe Diflucan 150 mg po x 1 in case of yeast infection.    Dispense 2, RF none. Please send to pharmacy of choice.   Cc- Claudette Laws

## 2015-03-12 NOTE — Telephone Encounter (Signed)
Call to patient with results from Dr. Edward Jolly.  Patient verbalized understanding of results and plan of care. Instructions regarding Keflex given, patient to complete all medication. Patient advised regarding diflucan prescription, will take as needed with any vaginal itching, white vaginal discharge or irritation.  Call back with any concerning symptoms.   Routing to provider for final review. Patient agreeable to disposition. Will close encounter.

## 2015-05-25 ENCOUNTER — Other Ambulatory Visit: Payer: Self-pay | Admitting: Endocrinology

## 2015-08-15 ENCOUNTER — Ambulatory Visit: Payer: PRIVATE HEALTH INSURANCE | Admitting: Endocrinology

## 2015-08-22 ENCOUNTER — Other Ambulatory Visit: Payer: Self-pay | Admitting: Endocrinology

## 2015-08-29 ENCOUNTER — Ambulatory Visit: Payer: PRIVATE HEALTH INSURANCE | Admitting: Obstetrics and Gynecology

## 2015-10-24 MED FILL — methIMAzole 10 MG TABS: 10 | 30 days supply | Qty: 30 | Fill #0

## 2015-11-24 DIAGNOSIS — H5213 Myopia, bilateral: Secondary | ICD-10-CM | POA: Diagnosis not present

## 2015-11-24 DIAGNOSIS — H52223 Regular astigmatism, bilateral: Secondary | ICD-10-CM | POA: Diagnosis not present

## 2015-11-30 ENCOUNTER — Encounter: Payer: Self-pay | Admitting: Nurse Practitioner

## 2015-11-30 ENCOUNTER — Encounter: Payer: Self-pay | Admitting: Endocrinology

## 2015-11-30 ENCOUNTER — Other Ambulatory Visit: Payer: Self-pay

## 2015-11-30 ENCOUNTER — Ambulatory Visit (INDEPENDENT_AMBULATORY_CARE_PROVIDER_SITE_OTHER): Payer: 59 | Admitting: Endocrinology

## 2015-11-30 ENCOUNTER — Ambulatory Visit (INDEPENDENT_AMBULATORY_CARE_PROVIDER_SITE_OTHER): Payer: 59 | Admitting: Nurse Practitioner

## 2015-11-30 VITALS — BP 110/62 | HR 86 | Ht 66.0 in | Wt 184.0 lb

## 2015-11-30 VITALS — BP 110/60 | HR 82 | Ht 66.0 in | Wt 184.0 lb

## 2015-11-30 DIAGNOSIS — Z0001 Encounter for general adult medical examination with abnormal findings: Secondary | ICD-10-CM | POA: Diagnosis not present

## 2015-11-30 DIAGNOSIS — R739 Hyperglycemia, unspecified: Secondary | ICD-10-CM

## 2015-11-30 DIAGNOSIS — E0580 Other thyrotoxicosis without thyrotoxic crisis or storm: Secondary | ICD-10-CM

## 2015-11-30 LAB — TSH: TSH: 1.9 u[IU]/mL (ref 0.35–4.50)

## 2015-11-30 LAB — POCT GLYCOSYLATED HEMOGLOBIN (HGB A1C): Hemoglobin A1C: 5.4

## 2015-11-30 MED ORDER — METHIMAZOLE 10 MG PO TABS: 10.0000 mg | ORAL_TABLET | Freq: Every day | ORAL | 1 refills | Status: DC

## 2015-11-30 MED FILL — methIMAzole 10 MG TABS: 10 | 30 days supply | Qty: 30 | Fill #0

## 2015-11-30 NOTE — Patient Instructions (Signed)
blood tests are requested for you today.  We'll let you know about the results. if ever you have fever while taking methimazole, stop it and call us, because of the risk of a rare side-effect.   Please come back for a follow-up appointment in 6 months.    

## 2015-11-30 NOTE — Progress Notes (Signed)
   Subjective:    Patient ID: Janet [name], female    DOB: 1975/04/15, 40 y.o.   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  She takes tapazole as rx'ed.  pt states she feels well in general.   Past Medical History:  Diagnosis Date  . HYPERSOMNIA 04/10/2010  . HYPERTHYROIDISM 08/03/2006    Past Surgical History:  Procedure Laterality Date  . CESAREAN SECTION     x3  . DILATION AND CURETTAGE OF UTERUS    . TUBAL LIGATION      Social History   Social History  . Marital status: Married    Spouse name: N/A  . Number of children: 3  . Years of education: N/A   Occupational History  . Not on file.   Social History Main Topics  . Smoking status: Never Smoker  . Smokeless tobacco: Never Used  . Alcohol use No  . Drug use: No  . Sexual activity: Yes    Partners: Male    Birth control/ protection: Surgical     Comment: Tubal   Other Topics Concern  . 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   No current facility-administered medications on file prior to visit.     Allergies  Allergen Reactions  . Sulfa Antibiotics Shortness Of Breath and Swelling    Family History  Problem Relation Age of Onset  . Hypertension Mother   . Hyperlipidemia Mother   . Sleep apnea Mother   . Thyroid disease Mother   . Stroke Mother   . Cancer Other     Aunt with Cancer- unknown type  . Lupus Other     Uncle  . Cancer Other     Lung Cancer-Uncle    BP 110/60   Pulse 82   Ht 5\' 6"  (1.676 m)   Wt 184 lb (83.5 kg)   SpO2 98%   BMI 29.70 kg/m    Review of Systems Denies fever.     Objective:   Physical Exam VITAL SIGNS:  See vs page.  GENERAL: no distress.  NECK: There is no  palpable thyroid enlargement.  No thyroid nodule is palpable.  No palpable lymphadenopathy at the anterior neck.     Lab Results  Component Value Date   TSH 1.90 11/30/2015      Assessment & Plan:  Hyperthyroidism: well-controlled.  Please continue the same medication Please come back for a follow-up appointment in 6 months.

## 2015-11-30 NOTE — Progress Notes (Signed)
Pre visit review using our clinic review tool, if applicable. No additional management support is needed unless otherwise documented below in the visit note. 

## 2015-11-30 NOTE — Patient Instructions (Signed)
Preventive Care for Adults, Female A healthy lifestyle and preventive care can promote health and wellness. Preventive health guidelines for women include the following key practices.  A routine yearly physical is a good way to check with your health care provider about your health and preventive screening. It is a chance to share any concerns and updates on your health and to receive a thorough exam.  Visit your dentist for a routine exam and preventive care every 6 months. Brush your teeth twice a day and floss once a day. Good oral hygiene prevents tooth decay and gum disease.  The frequency of eye exams is based on your age, health, family medical history, use of contact lenses, and other factors. Follow your health care provider's recommendations for frequency of eye exams.  Eat a healthy diet. Foods like vegetables, fruits, whole grains, low-fat dairy products, and lean protein foods contain the nutrients you need without too many calories. Decrease your intake of foods high in solid fats, added sugars, and salt. Eat the right amount of calories for you.Get information about a proper diet from your health care provider, if necessary.  Regular physical exercise is one of the most important things you can do for your health. Most adults should get at least 150 minutes of moderate-intensity exercise (any activity that increases your heart rate and causes you to sweat) each week. In addition, most adults need muscle-strengthening exercises on 2 or more days a week.  Maintain a healthy weight. The body mass index (BMI) is a screening tool to identify possible weight problems. It provides an estimate of body fat based on height and weight. Your health care provider can find your BMI and can help you achieve or maintain a healthy weight.For adults 20 years and older:  A BMI below 18.5 is considered underweight.  A BMI of 18.5 to 24.9 is normal.  A BMI of 25 to 29.9 is considered overweight.  A  BMI of 30 and above is considered obese.  Maintain normal blood lipids and cholesterol levels by exercising and minimizing your intake of saturated fat. Eat a balanced diet with plenty of fruit and vegetables. Blood tests for lipids and cholesterol should begin at age 45 and be repeated every 5 years. If your lipid or cholesterol levels are high, you are over 50, or you are at high risk for heart disease, you may need your cholesterol levels checked more frequently.Ongoing high lipid and cholesterol levels should be treated with medicines if diet and exercise are not working.  If you smoke, find out from your health care provider how to quit. If you do not use tobacco, do not start.  Lung cancer screening is recommended for adults aged 45-80 years who are at high risk for developing lung cancer because of a history of smoking. A yearly low-dose CT scan of the lungs is recommended for people who have at least a 30-pack-year history of smoking and are a current smoker or have quit within the past 15 years. A pack year of smoking is smoking an average of 1 pack of cigarettes a day for 1 year (for example: 1 pack a day for 30 years or 2 packs a day for 15 years). Yearly screening should continue until the smoker has stopped smoking for at least 15 years. Yearly screening should be stopped for people who develop a health problem that would prevent them from having lung cancer treatment.  If you are pregnant, do not drink alcohol. If you are  breastfeeding, be very cautious about drinking alcohol. If you are not pregnant and choose to drink alcohol, do not have more than 1 drink per day. One drink is considered to be 12 ounces (355 mL) of beer, 5 ounces (148 mL) of wine, or 1.5 ounces (44 mL) of liquor.  Avoid use of street drugs. Do not share needles with anyone. Ask for help if you need support or instructions about stopping the use of drugs.  High blood pressure causes heart disease and increases the risk  of stroke. Your blood pressure should be checked at least every 1 to 2 years. Ongoing high blood pressure should be treated with medicines if weight loss and exercise do not work.  If you are 55-79 years old, ask your health care provider if you should take aspirin to prevent strokes.  Diabetes screening is done by taking a blood sample to check your blood glucose level after you have not eaten for a certain period of time (fasting). If you are not overweight and you do not have risk factors for diabetes, you should be screened once every 3 years starting at age 45. If you are overweight or obese and you are 40-70 years of age, you should be screened for diabetes every year as part of your cardiovascular risk assessment.  Breast cancer screening is essential preventive care for women. You should practice "breast self-awareness." This means understanding the normal appearance and feel of your breasts and may include breast self-examination. Any changes detected, no matter how small, should be reported to a health care provider. Women in their 20s and 30s should have a clinical breast exam (CBE) by a health care provider as part of a regular health exam every 1 to 3 years. After age 40, women should have a CBE every year. Starting at age 40, women should consider having a mammogram (breast X-ray test) every year. Women who have a family history of breast cancer should talk to their health care provider about genetic screening. Women at a high risk of breast cancer should talk to their health care providers about having an MRI and a mammogram every year.  Breast cancer gene (BRCA)-related cancer risk assessment is recommended for women who have family members with BRCA-related cancers. BRCA-related cancers include breast, ovarian, tubal, and peritoneal cancers. Having family members with these cancers may be associated with an increased risk for harmful changes (mutations) in the breast cancer genes BRCA1 and  BRCA2. Results of the assessment will determine the need for genetic counseling and BRCA1 and BRCA2 testing.  Your health care provider may recommend that you be screened regularly for cancer of the pelvic organs (ovaries, uterus, and vagina). This screening involves a pelvic examination, including checking for microscopic changes to the surface of your cervix (Pap test). You may be encouraged to have this screening done every 3 years, beginning at age 21.  For women ages 30-65, health care providers may recommend pelvic exams and Pap testing every 3 years, or they may recommend the Pap and pelvic exam, combined with testing for human papilloma virus (HPV), every 5 years. Some types of HPV increase your risk of cervical cancer. Testing for HPV may also be done on women of any age with unclear Pap test results.  Other health care providers may not recommend any screening for nonpregnant women who are considered low risk for pelvic cancer and who do not have symptoms. Ask your health care provider if a screening pelvic exam is right for   you.  If you have had past treatment for cervical cancer or a condition that could lead to cancer, you need Pap tests and screening for cancer for at least 20 years after your treatment. If Pap tests have been discontinued, your risk factors (such as having a new sexual partner) need to be reassessed to determine if screening should resume. Some women have medical problems that increase the chance of getting cervical cancer. In these cases, your health care provider may recommend more frequent screening and Pap tests.  Colorectal cancer can be detected and often prevented. Most routine colorectal cancer screening begins at the age of 50 years and continues through age 75 years. However, your health care provider may recommend screening at an earlier age if you have risk factors for colon cancer. On a yearly basis, your health care provider may provide home test kits to check  for hidden blood in the stool. Use of a small camera at the end of a tube, to directly examine the colon (sigmoidoscopy or colonoscopy), can detect the earliest forms of colorectal cancer. Talk to your health care provider about this at age 50, when routine screening begins. Direct exam of the colon should be repeated every 5-10 years through age 75 years, unless early forms of precancerous polyps or small growths are found.  People who are at an increased risk for hepatitis B should be screened for this virus. You are considered at high risk for hepatitis B if:  You were born in a country where hepatitis B occurs often. Talk with your health care provider about which countries are considered high risk.  Your parents were born in a high-risk country and you have not received a shot to protect against hepatitis B (hepatitis B vaccine).  You have HIV or AIDS.  You use needles to inject street drugs.  You live with, or have sex with, someone who has hepatitis B.  You get hemodialysis treatment.  You take certain medicines for conditions like cancer, organ transplantation, and autoimmune conditions.  Hepatitis C blood testing is recommended for all people born from 1945 through 1965 and any individual with known risks for hepatitis C.  Practice safe sex. Use condoms and avoid high-risk sexual practices to reduce the spread of sexually transmitted infections (STIs). STIs include gonorrhea, chlamydia, syphilis, trichomonas, herpes, HPV, and human immunodeficiency virus (HIV). Herpes, HIV, and HPV are viral illnesses that have no cure. They can result in disability, cancer, and death.  You should be screened for sexually transmitted illnesses (STIs) including gonorrhea and chlamydia if:  You are sexually active and are younger than 24 years.  You are older than 24 years and your health care provider tells you that you are at risk for this type of infection.  Your sexual activity has changed  since you were last screened and you are at an increased risk for chlamydia or gonorrhea. Ask your health care provider if you are at risk.  If you are at risk of being infected with HIV, it is recommended that you take a prescription medicine daily to prevent HIV infection. This is called preexposure prophylaxis (PrEP). You are considered at risk if:  You are sexually active and do not regularly use condoms or know the HIV status of your partner(s).  You take drugs by injection.  You are sexually active with a partner who has HIV.  Talk with your health care provider about whether you are at high risk of being infected with HIV. If   you choose to begin PrEP, you should first be tested for HIV. You should then be tested every 3 months for as long as you are taking PrEP.  Osteoporosis is a disease in which the bones lose minerals and strength with aging. This can result in serious bone fractures or breaks. The risk of osteoporosis can be identified using a bone density scan. Women ages 67 years and over and women at risk for fractures or osteoporosis should discuss screening with their health care providers. Ask your health care provider whether you should take a calcium supplement or vitamin D to reduce the rate of osteoporosis.  Menopause can be associated with physical symptoms and risks. Hormone replacement therapy is available to decrease symptoms and risks. You should talk to your health care provider about whether hormone replacement therapy is right for you.  Use sunscreen. Apply sunscreen liberally and repeatedly throughout the day. You should seek shade when your shadow is shorter than you. Protect yourself by wearing long sleeves, pants, a wide-brimmed hat, and sunglasses year round, whenever you are outdoors.  Once a month, do a whole body skin exam, using a mirror to look at the skin on your back. Tell your health care provider of new moles, moles that have irregular borders, moles that  are larger than a pencil eraser, or moles that have changed in shape or color.  Stay current with required vaccines (immunizations).  Influenza vaccine. All adults should be immunized every year.  Tetanus, diphtheria, and acellular pertussis (Td, Tdap) vaccine. Pregnant women should receive 1 dose of Tdap vaccine during each pregnancy. The dose should be obtained regardless of the length of time since the last dose. Immunization is preferred during the 27th-36th week of gestation. An adult who has not previously received Tdap or who does not know her vaccine status should receive 1 dose of Tdap. This initial dose should be followed by tetanus and diphtheria toxoids (Td) booster doses every 10 years. Adults with an unknown or incomplete history of completing a 3-dose immunization series with Td-containing vaccines should begin or complete a primary immunization series including a Tdap dose. Adults should receive a Td booster every 10 years.  Varicella vaccine. An adult without evidence of immunity to varicella should receive 2 doses or a second dose if she has previously received 1 dose. Pregnant females who do not have evidence of immunity should receive the first dose after pregnancy. This first dose should be obtained before leaving the health care facility. The second dose should be obtained 4-8 weeks after the first dose.  Human papillomavirus (HPV) vaccine. Females aged 13-26 years who have not received the vaccine previously should obtain the 3-dose series. The vaccine is not recommended for use in pregnant females. However, pregnancy testing is not needed before receiving a dose. If a female is found to be pregnant after receiving a dose, no treatment is needed. In that case, the remaining doses should be delayed until after the pregnancy. Immunization is recommended for any person with an immunocompromised condition through the age of 61 years if she did not get any or all doses earlier. During the  3-dose series, the second dose should be obtained 4-8 weeks after the first dose. The third dose should be obtained 24 weeks after the first dose and 16 weeks after the second dose.  Zoster vaccine. One dose is recommended for adults aged 30 years or older unless certain conditions are present.  Measles, mumps, and rubella (MMR) vaccine. Adults born  before 1957 generally are considered immune to measles and mumps. Adults born in 1957 or later should have 1 or more doses of MMR vaccine unless there is a contraindication to the vaccine or there is laboratory evidence of immunity to each of the three diseases. A routine second dose of MMR vaccine should be obtained at least 28 days after the first dose for students attending postsecondary schools, health care workers, or international travelers. People who received inactivated measles vaccine or an unknown type of measles vaccine during 1963-1967 should receive 2 doses of MMR vaccine. People who received inactivated mumps vaccine or an unknown type of mumps vaccine before 1979 and are at high risk for mumps infection should consider immunization with 2 doses of MMR vaccine. For females of childbearing age, rubella immunity should be determined. If there is no evidence of immunity, females who are not pregnant should be vaccinated. If there is no evidence of immunity, females who are pregnant should delay immunization until after pregnancy. Unvaccinated health care workers born before 1957 who lack laboratory evidence of measles, mumps, or rubella immunity or laboratory confirmation of disease should consider measles and mumps immunization with 2 doses of MMR vaccine or rubella immunization with 1 dose of MMR vaccine.  Pneumococcal 13-valent conjugate (PCV13) vaccine. When indicated, a person who is uncertain of his immunization history and has no record of immunization should receive the PCV13 vaccine. All adults 65 years of age and older should receive this  vaccine. An adult aged 19 years or older who has certain medical conditions and has not been previously immunized should receive 1 dose of PCV13 vaccine. This PCV13 should be followed with a dose of pneumococcal polysaccharide (PPSV23) vaccine. Adults who are at high risk for pneumococcal disease should obtain the PPSV23 vaccine at least 8 weeks after the dose of PCV13 vaccine. Adults older than 38 years of age who have normal immune system function should obtain the PPSV23 vaccine dose at least 1 year after the dose of PCV13 vaccine.  Pneumococcal polysaccharide (PPSV23) vaccine. When PCV13 is also indicated, PCV13 should be obtained first. All adults aged 65 years and older should be immunized. An adult younger than age 65 years who has certain medical conditions should be immunized. Any person who resides in a nursing home or long-term care facility should be immunized. An adult smoker should be immunized. People with an immunocompromised condition and certain other conditions should receive both PCV13 and PPSV23 vaccines. People with human immunodeficiency virus (HIV) infection should be immunized as soon as possible after diagnosis. Immunization during chemotherapy or radiation therapy should be avoided. Routine use of PPSV23 vaccine is not recommended for American Indians, [place] Natives, or people younger than 65 years unless there are medical conditions that require PPSV23 vaccine. When indicated, people who have unknown immunization and have no record of immunization should receive PPSV23 vaccine. One-time revaccination 5 years after the first dose of PPSV23 is recommended for people aged 19-64 years who have chronic kidney failure, nephrotic syndrome, asplenia, or immunocompromised conditions. People who received 1-2 doses of PPSV23 before age 65 years should receive another dose of PPSV23 vaccine at age 65 years or later if at least 5 years have passed since the previous dose. Doses of PPSV23 are not  needed for people immunized with PPSV23 at or after age 65 years.  Meningococcal vaccine. Adults with asplenia or persistent complement component deficiencies should receive 2 doses of quadrivalent meningococcal conjugate (MenACWY-D) vaccine. The doses should be obtained   at least 2 months apart. Microbiologists working with certain meningococcal bacteria, [place] recruits, people at risk during an outbreak, and people who travel to or live in countries with a high rate of meningitis should be immunized. A first-year college student up through age 34 years who is living in a residence hall should receive a dose if she did not receive a dose on or after her 16th birthday. Adults who have certain high-risk conditions should receive one or more doses of vaccine.  Hepatitis A vaccine. Adults who wish to be protected from this disease, have certain high-risk conditions, work with hepatitis A-infected animals, work in hepatitis A research labs, or travel to or work in countries with a high rate of hepatitis A should be immunized. Adults who were previously unvaccinated and who anticipate close contact with an international adoptee during the first 60 days after arrival in the [place] from a country with a high rate of hepatitis A should be immunized.  Hepatitis B vaccine. Adults who wish to be protected from this disease, have certain high-risk conditions, may be exposed to blood or other infectious body fluids, are household contacts or sex partners of hepatitis B positive people, are clients or workers in certain care facilities, or travel to or work in countries with a high rate of hepatitis B should be immunized.  Haemophilus influenzae type b (Hib) vaccine. A previously unvaccinated person with asplenia or sickle cell disease or having a scheduled splenectomy should receive 1 dose of Hib vaccine. Regardless of previous immunization, a recipient of a hematopoietic stem cell transplant should receive a  3-dose series 6-12 months after her successful transplant. Hib vaccine is not recommended for adults with HIV infection. Preventive Services / Frequency Ages 35 to 4 years  Blood pressure check.** / Every 3-5 years.  Lipid and cholesterol check.** / Every 5 years beginning at age 60.  Clinical breast exam.** / Every 3 years for women in their 71s and 10s.  BRCA-related cancer risk assessment.** / For women who have family members with a BRCA-related cancer (breast, ovarian, tubal, or peritoneal cancers).  Pap test.** / Every 2 years from ages 76 through 26. Every 3 years starting at age 61 through age 76 or 93 with a history of 3 consecutive normal Pap tests.  HPV screening.** / Every 3 years from ages 37 through ages 60 to 51 with a history of 3 consecutive normal Pap tests.  Hepatitis C blood test.** / For any individual with known risks for hepatitis C.  Skin self-exam. / Monthly.  Influenza vaccine. / Every year.  Tetanus, diphtheria, and acellular pertussis (Tdap, Td) vaccine.** / Consult your health care provider. Pregnant women should receive 1 dose of Tdap vaccine during each pregnancy. 1 dose of Td every 10 years.  Varicella vaccine.** / Consult your health care provider. Pregnant females who do not have evidence of immunity should receive the first dose after pregnancy.  HPV vaccine. / 3 doses over 6 months, if 93 and younger. The vaccine is not recommended for use in pregnant females. However, pregnancy testing is not needed before receiving a dose.  Measles, mumps, rubella (MMR) vaccine.** / You need at least 1 dose of MMR if you were born in 1957 or later. You may also need a 2nd dose. For females of childbearing age, rubella immunity should be determined. If there is no evidence of immunity, females who are not pregnant should be vaccinated. If there is no evidence of immunity, females who are  pregnant should delay immunization until after pregnancy.  Pneumococcal  13-valent conjugate (PCV13) vaccine.** / Consult your health care provider.  Pneumococcal polysaccharide (PPSV23) vaccine.** / 1 to 2 doses if you smoke cigarettes or if you have certain conditions.  Meningococcal vaccine.** / 1 dose if you are age 68 to 8 years and a Market researcher living in a residence hall, or have one of several medical conditions, you need to get vaccinated against meningococcal disease. You may also need additional booster doses.  Hepatitis A vaccine.** / Consult your health care provider.  Hepatitis B vaccine.** / Consult your health care provider.  Haemophilus influenzae type b (Hib) vaccine.** / Consult your health care provider. Ages 7 to 53 years  Blood pressure check.** / Every year.  Lipid and cholesterol check.** / Every 5 years beginning at age 25 years.  Lung cancer screening. / Every year if you are aged 11-80 years and have a 30-pack-year history of smoking and currently smoke or have quit within the past 15 years. Yearly screening is stopped once you have quit smoking for at least 15 years or develop a health problem that would prevent you from having lung cancer treatment.  Clinical breast exam.** / Every year after age 48 years.  BRCA-related cancer risk assessment.** / For women who have family members with a BRCA-related cancer (breast, ovarian, tubal, or peritoneal cancers).  Mammogram.** / Every year beginning at age 41 years and continuing for as long as you are in good health. Consult with your health care provider.  Pap test.** / Every 3 years starting at age 65 years through age 37 or 70 years with a history of 3 consecutive normal Pap tests.  HPV screening.** / Every 3 years from ages 72 years through ages 60 to 40 years with a history of 3 consecutive normal Pap tests.  Fecal occult blood test (FOBT) of stool. / Every year beginning at age 21 years and continuing until age 5 years. You may not need to do this test if you get  a colonoscopy every 10 years.  Flexible sigmoidoscopy or colonoscopy.** / Every 5 years for a flexible sigmoidoscopy or every 10 years for a colonoscopy beginning at age 35 years and continuing until age 48 years.  Hepatitis C blood test.** / For all people born from [birth year] through 1965 and any individual with known risks for hepatitis C.  Skin self-exam. / Monthly.  Influenza vaccine. / Every year.  Tetanus, diphtheria, and acellular pertussis (Tdap/Td) vaccine.** / Consult your health care provider. Pregnant women should receive 1 dose of Tdap vaccine during each pregnancy. 1 dose of Td every 10 years.  Varicella vaccine.** / Consult your health care provider. Pregnant females who do not have evidence of immunity should receive the first dose after pregnancy.  Zoster vaccine.** / 1 dose for adults aged 30 years or older.  Measles, mumps, rubella (MMR) vaccine.** / You need at least 1 dose of MMR if you were born in 1957 or later. You may also need a second dose. For females of childbearing age, rubella immunity should be determined. If there is no evidence of immunity, females who are not pregnant should be vaccinated. If there is no evidence of immunity, females who are pregnant should delay immunization until after pregnancy.  Pneumococcal 13-valent conjugate (PCV13) vaccine.** / Consult your health care provider.  Pneumococcal polysaccharide (PPSV23) vaccine.** / 1 to 2 doses if you smoke cigarettes or if you have certain conditions.  Meningococcal vaccine.** /  Consult your health care provider.  Hepatitis A vaccine.** / Consult your health care provider.  Hepatitis B vaccine.** / Consult your health care provider.  Haemophilus influenzae type b (Hib) vaccine.** / Consult your health care provider. Ages 64 years and over  Blood pressure check.** / Every year.  Lipid and cholesterol check.** / Every 5 years beginning at age 23 years.  Lung cancer screening. / Every year if you  are aged 16-80 years and have a 30-pack-year history of smoking and currently smoke or have quit within the past 15 years. Yearly screening is stopped once you have quit smoking for at least 15 years or develop a health problem that would prevent you from having lung cancer treatment.  Clinical breast exam.** / Every year after age 74 years.  BRCA-related cancer risk assessment.** / For women who have family members with a BRCA-related cancer (breast, ovarian, tubal, or peritoneal cancers).  Mammogram.** / Every year beginning at age 44 years and continuing for as long as you are in good health. Consult with your health care provider.  Pap test.** / Every 3 years starting at age 58 years through age 22 or 39 years with 3 consecutive normal Pap tests. Testing can be stopped between 65 and 70 years with 3 consecutive normal Pap tests and no abnormal Pap or HPV tests in the past 10 years.  HPV screening.** / Every 3 years from ages 64 years through ages 70 or 61 years with a history of 3 consecutive normal Pap tests. Testing can be stopped between 65 and 70 years with 3 consecutive normal Pap tests and no abnormal Pap or HPV tests in the past 10 years.  Fecal occult blood test (FOBT) of stool. / Every year beginning at age 40 years and continuing until age 27 years. You may not need to do this test if you get a colonoscopy every 10 years.  Flexible sigmoidoscopy or colonoscopy.** / Every 5 years for a flexible sigmoidoscopy or every 10 years for a colonoscopy beginning at age 7 years and continuing until age 32 years.  Hepatitis C blood test.** / For all people born from [birth year] through 1963 and any individual with known risks for hepatitis C.  Osteoporosis screening.** / A one-time screening for women ages 30 years and over and women at risk for fractures or osteoporosis.  Skin self-exam. / Monthly.  Influenza vaccine. / Every year.  Tetanus, diphtheria, and acellular pertussis (Tdap/Td)  vaccine.** / 1 dose of Td every 10 years.  Varicella vaccine.** / Consult your health care provider.  Zoster vaccine.** / 1 dose for adults aged 35 years or older.  Pneumococcal 13-valent conjugate (PCV13) vaccine.** / Consult your health care provider.  Pneumococcal polysaccharide (PPSV23) vaccine.** / 1 dose for all adults aged 46 years and older.  Meningococcal vaccine.** / Consult your health care provider.  Hepatitis A vaccine.** / Consult your health care provider.  Hepatitis B vaccine.** / Consult your health care provider.  Haemophilus influenzae type b (Hib) vaccine.** / Consult your health care provider. ** Family history and personal history of risk and conditions may change your health care provider's recommendations.   This information is not intended to replace advice given to you by your health care provider. Make sure you discuss any questions you have with your health care provider.   Document Released: 03/25/2001 Document Revised: 02/17/2014 Document Reviewed: 06/24/2010 Elsevier Interactive Patient Education [employer].

## 2015-11-30 NOTE — Progress Notes (Signed)
Subjective:    Patient ID: Janet [name], female    DOB: [birth year], 40 y.o.   MRN: [id number]  Patient presents today for complete physical  HPI  Immunizations: (TDAP, Hep C screen, Pneumovax, Influenza, zoster)  Health Maintenance  Topic Date Due  . HIV Screening  11/10/2016*  . Pap Smear  06/09/2016  . Tetanus Vaccine  04/20/2023  . Flu Shot  Addressed  *Topic was postponed. The date shown is not the original due date.   Diet:healthy Weight:  Wt Readings from Last 3 Encounters:  11/30/15 184 lb (83.5 kg)  11/30/15 184 lb (83.5 kg)  03/09/15 177 lb (80.3 kg)   Exercise:none Fall Risk: Fall Risk  04/19/2013  Falls in the past year? No   Depression/Suicide: Depression screen [facility] 2/9 04/19/2013  Decreased Interest 0  Down, Depressed, Hopeless 0  PHQ - 2 Score 0   No flowsheet data found. Pap Smear (every 48yrs for >21-29 without HPV, every 49yrs for >30-46yrs with HPV):up to date Vision:up to date Dental:up to date Advanced Directive: Advanced Directives 10/22/2013  Does patient have an advance directive? No  Would patient like information on creating an advanced directive? No - patient declined information   Medications and allergies reviewed with patient and updated if appropriate.  Patient Active Problem List   Diagnosis Date Noted  . Wellness examination 02/15/2015  . Pre-employment examination 04/19/2013  . Back pain 11/25/2012  . Abdominal tenderness 11/25/2012  . Dyspnea 04/25/2011  . Palpitations 04/25/2011  . Paresthesia 12/30/2010  . Preventative health care 12/30/2010  . Right carpal tunnel syndrome 12/30/2010  . HYPERSOMNIA 04/10/2010  . Cough 03/05/2009  . LEUKOPENIA, MILD 09/21/2008  . FATIGUE 01/19/2008  . Thyrotoxicosis 08/03/2006    Current Outpatient Prescriptions on File Prior to Visit  Medication Sig Dispense Refill  . methimazole (TAPAZOLE) 10 MG tablet TAKE 1 TABLET BY MOUTH DAILY 30 tablet 1   No current facility-administered  medications on file prior to visit.     Past Medical History:  Diagnosis Date  . HYPERSOMNIA 04/10/2010  . HYPERTHYROIDISM 08/03/2006    Past Surgical History:  Procedure Laterality Date  . CESAREAN SECTION     x3  . DILATION AND CURETTAGE OF UTERUS    . TUBAL LIGATION      Social History   Social History  . Marital status: Married    Spouse name: N/A  . Number of children: 3  . Years of education: N/A   Social History Main Topics  . Smoking status: Never Smoker  . Smokeless tobacco: Never Used  . Alcohol use No  . Drug use: No  . Sexual activity: Yes    Partners: Male    Birth control/ protection: Surgical     Comment: Tubal   Other Topics Concern  . 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 Hypertension Mother   . Hyperlipidemia Mother   . Sleep apnea Mother   . Thyroid disease Mother   . Stroke Mother   . Cancer Other     Aunt with Cancer- unknown type  . Lupus Other     Uncle  . Cancer Other     Lung Cancer-Uncle        Review of Systems  Constitutional: Negative for fever, malaise/fatigue and weight loss.  HENT: Negative for congestion and sore throat.   Eyes:       Negative  for visual changes  Respiratory: Negative for cough and shortness of breath.   Cardiovascular: Negative for chest pain, palpitations and leg swelling.  Gastrointestinal: Negative for blood in stool, constipation, diarrhea and heartburn.  Genitourinary: Negative for dysuria, frequency and urgency.  Musculoskeletal: Negative for falls, joint pain and myalgias.  Skin: Negative for rash.  Neurological: Negative for dizziness, sensory change and headaches.  Endo/Heme/Allergies: Does not bruise/bleed easily.  Psychiatric/Behavioral: Negative for depression, substance abuse and suicidal ideas. The patient is not nervous/anxious.     Objective:   Vitals:   11/30/15 1011  BP: 110/62  Pulse: 86    Body mass  index is 29.7 kg/m.   Physical Examination:  Physical Exam  Constitutional: She is oriented to person, place, and time and well-developed, well-nourished, and in no distress. No distress.  HENT:  Right Ear: External ear normal.  Left Ear: External ear normal.  Nose: Nose normal.  Mouth/Throat: Oropharynx is clear and moist. No oropharyngeal exudate.  Eyes: Conjunctivae and EOM are normal. Pupils are equal, round, and reactive to light. No scleral icterus.  Neck: Normal range of motion. Neck supple. No thyromegaly present.  Cardiovascular: Normal rate, normal heart sounds and intact distal pulses.   Pulmonary/Chest: Effort normal and breath sounds normal. She exhibits no tenderness. Right breast exhibits no inverted nipple, no mass, no nipple discharge, no skin change and no tenderness. Left breast exhibits mass. Left breast exhibits no inverted nipple, no nipple discharge, no skin change and no tenderness. Breasts are symmetrical.  Abdominal: Soft. Bowel sounds are normal. She exhibits no distension. There is no tenderness.  Musculoskeletal: Normal range of motion. She exhibits no edema or tenderness.  Lymphadenopathy:    She has no cervical adenopathy.  Neurological: She is alert and oriented to person, place, and time. Gait normal.  Skin: Skin is warm and dry.  Psychiatric: Affect and judgment normal.    ASSESSMENT and PLAN:  Janet [name] was seen today for annual exam.  Diagnoses and all orders for this visit:  Hyperglycemia -     Cancel: Hemoglobin A1c; Future -     POCT HgB A1C  Encounter for preventative adult health care exam with abnormal findings    No problem-specific Assessment & Plan notes found for this encounter.  Recent Results (from the past 2160 hour(s))  TSH     Status: None   Collection Time: 11/30/15  9:01 AM  Result Value Ref Range   TSH 1.90 0.35 - 4.50 uIU/mL  POCT HgB A1C     Status: Normal   Collection Time: 11/30/15 10:50 AM  Result Value Ref  Range   Hemoglobin A1C 5.4       CBC Latest Ref Rng & Units 03/09/2015 08/03/2014 04/19/2013  WBC 4.0 - 10.5 K/uL 6.4 - 5.9  Hemoglobin 12.0 - 15.0 g/dL 16.1 09.6 04.5  Hematocrit 36.0 - 46.0 % 40.1 - 38.7  Platelets 150 - 400 K/uL 387 - 328.0   CMP Latest Ref Rng & Units 03/09/2015 04/19/2013 04/25/2011  Glucose 65 - 99 mg/dL 74 77 84  BUN 7 - 25 mg/dL 11 13 11   Creatinine 0.50 - 1.10 mg/dL 4.09 0.7 0.6  Sodium 811 - 146 mmol/L 137 135 138  Potassium 3.5 - 5.3 mmol/L 4.2 3.8 4.0  Chloride 98 - 110 mmol/L 100 102 107  CO2 20 - 31 mmol/L 24 27 26   Calcium 8.6 - 10.2 mg/dL 9.1 9.0 9.0  Total Protein 6.1 - 8.1 g/dL 7.2 7.7 7.0  Total  Bilirubin 0.2 - 1.2 mg/dL 0.4 0.7 0.3  Alkaline Phos 33 - 115 U/L 65 61 66  AST 10 - 30 U/L 19 19 32  ALT 6 - 29 U/L 23 18 55(H)    Lipid Panel     Component Value Date/Time   CHOL 153 02/15/2015 0801   TRIG 46.0 02/15/2015 0801   HDL 54.10 02/15/2015 0801   CHOLHDL 3 02/15/2015 0801   VLDL 9.2 02/15/2015 0801   LDLCALC 90 02/15/2015 0801    Follow up: Return in about 1 year (around 11/29/2016), or if symptoms worsen or fail to improve, for CPE.  Janet Pennaharlotte Edee Nifong, NP

## 2015-11-30 NOTE — Progress Notes (Signed)
Reviewed with patient in office. See office note

## 2015-12-01 MED ORDER — METHIMAZOLE 10 MG PO TABS: 10.0000 mg | ORAL_TABLET | Freq: Every day | ORAL | 3 refills | Status: DC

## 2015-12-14 MED FILL — IBUPROFEN 600 MG TABLET: 600 | 7 days supply | Qty: 20 | Fill #0

## 2016-01-10 MED FILL — methIMAzole 10 MG TABS: 10 | 30 days supply | Qty: 30 | Fill #1

## 2016-02-12 ENCOUNTER — Other Ambulatory Visit: Payer: Self-pay | Admitting: Endocrinology

## 2016-02-12 MED FILL — methIMAzole 10 MG TABS: 10 | 30 days supply | Qty: 30 | Fill #0

## 2016-02-13 ENCOUNTER — Telehealth: Payer: Self-pay | Admitting: Nurse Practitioner

## 2016-02-13 NOTE — Telephone Encounter (Signed)
Patient Name: Janet [name]  DOB: 10-02-1975    Initial Comment Caller states, she has a cold. Needing something called in. Verified    Nurse Assessment  Nurse: Stefano GaulStringer, RN, Dwana CurdVera Date/Time (Eastern Time): 02/13/2016 8:41:46 AM  Confirm and document reason for call. If symptomatic, describe symptoms. ---Caller states she has a cold with sinus congestion. She is coughing. Has a headache. No fever. symptoms started on Saturday night.  Does the patient have any new or worsening symptoms? ---Yes  Will a triage be completed? ---Yes  Related visit to physician within the last 2 weeks? ---No  Does the PT have any chronic conditions? (i.e. diabetes, asthma, etc.) ---No  Is the patient pregnant or possibly pregnant? (Ask all females between the ages of 10912-55) ---No  Is this a behavioral health or substance abuse call? ---No     Guidelines    Guideline Title Affirmed Question Affirmed Notes  Sinus Pain or Congestion Lots of coughing    Final Disposition User   See PCP When Office is Open (within 3 days) Stefano GaulStringer, RN, Dwana CurdVera    Comments  Pt wants appt today. [facility]. Pt does not want to go to another office or go to urgent care. Pt would like medication called in for her sinus congestion. Please call pt back regarding medication.   Disagree/Comply: Comply

## 2016-03-28 MED FILL — methIMAzole 10 MG TABS: 10 | 30 days supply | Qty: 30 | Fill #1

## 2016-05-07 MED FILL — methIMAzole 10 MG TABS: 10 | 90 days supply | Qty: 90 | Fill #0

## 2016-05-22 ENCOUNTER — Encounter: Payer: Self-pay | Admitting: Obstetrics & Gynecology

## 2016-05-23 ENCOUNTER — Ambulatory Visit (INDEPENDENT_AMBULATORY_CARE_PROVIDER_SITE_OTHER): Payer: 59 | Admitting: Endocrinology

## 2016-05-23 ENCOUNTER — Ambulatory Visit: Payer: PRIVATE HEALTH INSURANCE | Admitting: Obstetrics & Gynecology

## 2016-05-23 ENCOUNTER — Encounter: Payer: Self-pay | Admitting: Endocrinology

## 2016-05-23 ENCOUNTER — Other Ambulatory Visit: Payer: 59

## 2016-05-23 ENCOUNTER — Ambulatory Visit: Payer: 59 | Admitting: Endocrinology

## 2016-05-23 VITALS — BP 122/84 | HR 93 | Ht 66.0 in | Wt 185.0 lb

## 2016-05-23 DIAGNOSIS — Z0000 Encounter for general adult medical examination without abnormal findings: Secondary | ICD-10-CM

## 2016-05-23 DIAGNOSIS — L309 Dermatitis, unspecified: Secondary | ICD-10-CM | POA: Diagnosis not present

## 2016-05-23 DIAGNOSIS — E0580 Other thyrotoxicosis without thyrotoxic crisis or storm: Secondary | ICD-10-CM

## 2016-05-23 DIAGNOSIS — L669 Cicatricial alopecia, unspecified: Secondary | ICD-10-CM | POA: Diagnosis not present

## 2016-05-23 DIAGNOSIS — L219 Seborrheic dermatitis, unspecified: Secondary | ICD-10-CM | POA: Diagnosis not present

## 2016-05-23 LAB — LIPID PANEL
CHOL/HDL RATIO: 3
Cholesterol: 163 mg/dL (ref 0–200)
HDL: 61.9 mg/dL (ref >39.00)
LDL CALC: 90 mg/dL (ref 0–99)
NonHDL: 100.88
TRIGLYCERIDES: 56 mg/dL (ref 0.0–149.0)
VLDL: 11.2 mg/dL (ref 0.0–40.0)

## 2016-05-23 LAB — TSH: TSH: 3.14 u[IU]/mL (ref 0.35–4.50)

## 2016-05-23 LAB — T4, FREE: Free T4: 0.76 ng/dL (ref 0.60–1.60)

## 2016-05-23 LAB — T3, FREE: T3 FREE: 3.9 pg/mL (ref 2.3–4.2)

## 2016-05-23 MED FILL — KETOCONAZOLE 2% SHAMPOO: 2 | 30 days supply | Qty: 120 | Fill #0

## 2016-05-23 MED FILL — FLUOCINONIDE 0.05% SOLUTION: 0.05 | 30 days supply | Qty: 60 | Fill #0

## 2016-05-23 NOTE — Progress Notes (Signed)
   Subjective:    Patient ID: Janet [name], female    DOB: 1975-04-08, 41 y.o.   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  She takes tapazole as rx'ed.  She has slight pain at the anterior neck, but no assoc swelling.  Past Medical History:  Diagnosis Date  . HYPERSOMNIA 04/10/2010  . HYPERTHYROIDISM 08/03/2006    Past Surgical History:  Procedure Laterality Date  . CESAREAN SECTION     x3  . DILATION AND CURETTAGE OF UTERUS    . TUBAL LIGATION      Social History   Social History  . Marital status: Married    Spouse name: N/A  . Number of children: 3  . Years of education: N/A   Occupational History  . Not on file.   Social History Main Topics  . Smoking status: Never Smoker  . Smokeless tobacco: Never Used  . Alcohol use No  . Drug use: No  . Sexual activity: Yes    Partners: Male    Birth control/ protection: Surgical     Comment: Tubal   Other Topics Concern  . 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 methimazole (TAPAZOLE) 10 MG tablet TAKE 1 TABLET BY MOUTH DAILY. 30 tablet 1   No current facility-administered medications on file prior to visit.     Allergies  Allergen Reactions  . Sulfa Antibiotics Shortness Of Breath and Swelling    Family History  Problem Relation Age of Onset  . Hypertension Mother   . Hyperlipidemia Mother   . Sleep apnea Mother   . Thyroid disease Mother   . Stroke Mother   . Cancer Other     Aunt with Cancer- unknown type  . Lupus Other     Uncle  . Cancer Other     Lung Cancer-Uncle   BP 122/84   Pulse 93   Ht  (1.676 m)   Wt 185 lb (83.9 kg)   SpO2 96%   BMI 29.86 kg/m   Review of  Systems Denies fever.  She has gained 1 lb since last ov.      Objective:   Physical Exam VITAL SIGNS:  See vs page GENERAL: no distress NECK: There is no palpable thyroid enlargement.  No thyroid nodule is palpable.  No palpable lymphadenopathy at the anterior neck.  Lab Results  Component Value Date   TSH 3.14 05/23/2016      Assessment & Plan:  Hyperthyroidism: well-controlled.  Neck pain, new, uncertain etiology.  Patient Instructions  blood tests are requested for you today.  We'll let you know about the results. if ever you have fever while taking methimazole, stop it and call us, because of the risk of a rare side-effect.  Let's check the ultrasound.  you will receive a phone call, about a day and time for an appointment Please come back for a follow-up appointment in 6 months.

## 2016-05-23 NOTE — Patient Instructions (Addendum)
blood tests are requested for you today.  We'll let you know about the results. if ever you have fever while taking methimazole, stop it and call us, because of the risk of a rare side-effect.  Let's check the ultrasound.  you will receive a phone call, about a day and time for an appointment Please come back for a follow-up appointment in 6 months.

## 2016-05-30 ENCOUNTER — Ambulatory Visit: Payer: 59 | Admitting: Endocrinology

## 2016-06-06 ENCOUNTER — Ambulatory Visit
Admission: RE | Admit: 2016-06-06 | Discharge: 2016-06-06 | Disposition: A | Discharge status: Home / Self Care | Payer: 59 | Source: Ambulatory Visit | Attending: Endocrinology | Admitting: Endocrinology

## 2016-06-06 DIAGNOSIS — E0590 Thyrotoxicosis, unspecified without thyrotoxic crisis or storm: Secondary | ICD-10-CM | POA: Diagnosis not present

## 2016-06-06 DIAGNOSIS — E0580 Other thyrotoxicosis without thyrotoxic crisis or storm: Secondary | ICD-10-CM

## 2016-09-12 MED FILL — methIMAzole 10 MG TABS: 10 | 90 days supply | Qty: 90 | Fill #1

## 2016-10-03 ENCOUNTER — Encounter: Payer: Self-pay | Admitting: Nurse Practitioner

## 2016-10-03 ENCOUNTER — Other Ambulatory Visit (INDEPENDENT_AMBULATORY_CARE_PROVIDER_SITE_OTHER): Payer: 59

## 2016-10-03 ENCOUNTER — Ambulatory Visit (INDEPENDENT_AMBULATORY_CARE_PROVIDER_SITE_OTHER): Payer: 59 | Admitting: Nurse Practitioner

## 2016-10-03 ENCOUNTER — Ambulatory Visit (INDEPENDENT_AMBULATORY_CARE_PROVIDER_SITE_OTHER)
Admission: RE | Admit: 2016-10-03 | Discharge: 2016-10-03 | Disposition: A | Discharge status: Home / Self Care | Payer: 59 | Source: Ambulatory Visit | Attending: Nurse Practitioner | Admitting: Nurse Practitioner

## 2016-10-03 VITALS — BP 112/78 | HR 74 | Temp 97.8°F | Ht 66.0 in | Wt 184.0 lb

## 2016-10-03 DIAGNOSIS — R1084 Generalized abdominal pain: Secondary | ICD-10-CM

## 2016-10-03 DIAGNOSIS — K5900 Constipation, unspecified: Secondary | ICD-10-CM | POA: Diagnosis not present

## 2016-10-03 DIAGNOSIS — N3001 Acute cystitis with hematuria: Secondary | ICD-10-CM

## 2016-10-03 DIAGNOSIS — R1010 Upper abdominal pain, unspecified: Secondary | ICD-10-CM | POA: Diagnosis not present

## 2016-10-03 DIAGNOSIS — R748 Abnormal levels of other serum enzymes: Secondary | ICD-10-CM

## 2016-10-03 DIAGNOSIS — R630 Anorexia: Secondary | ICD-10-CM

## 2016-10-03 LAB — POCT URINALYSIS DIPSTICK
BILIRUBIN UA: NEGATIVE
GLUCOSE UA: NEGATIVE
Nitrite, UA: POSITIVE
Urobilinogen, UA: 1 E.U./dL
pH, UA: 6 (ref 5.0–8.0)

## 2016-10-03 LAB — HEPATIC FUNCTION PANEL
ALK PHOS: 63 U/L (ref 39–117)
ALT: 15 U/L (ref 0–35)
AST: 16 U/L (ref 0–37)
Albumin: 4 g/dL (ref 3.5–5.2)
BILIRUBIN TOTAL: 0.3 mg/dL (ref 0.2–1.2)
Bilirubin, Direct: 0.1 mg/dL (ref 0.0–0.3)
TOTAL PROTEIN: 7.2 g/dL (ref 6.0–8.3)

## 2016-10-03 LAB — CBC WITH DIFFERENTIAL/PLATELET
Basophils Absolute: 0.1 10*3/uL (ref 0.0–0.1)
Basophils Relative: 1.2 % (ref 0.0–3.0)
EOS PCT: 6.6 % — AB (ref 0.0–5.0)
Eosinophils Absolute: 0.4 10*3/uL (ref 0.0–0.7)
HEMATOCRIT: 39.1 % (ref 36.0–46.0)
Hemoglobin: 13.3 g/dL (ref 12.0–15.0)
LYMPHS ABS: 1.5 10*3/uL (ref 0.7–4.0)
Lymphocytes Relative: 28.1 % (ref 12.0–46.0)
MCHC: 34 g/dL (ref 30.0–36.0)
MCV: 86 fl (ref 78.0–100.0)
MONO ABS: 0.4 10*3/uL (ref 0.1–1.0)
Monocytes Relative: 8.3 % (ref 3.0–12.0)
NEUTROS PCT: 55.8 % (ref 43.0–77.0)
Neutro Abs: 3 10*3/uL (ref 1.4–7.7)
PLATELETS: 392 10*3/uL (ref 150.0–400.0)
RBC: 4.54 Mil/uL (ref 3.87–5.11)
RDW: 14 % (ref 11.5–15.5)
WBC: 5.4 10*3/uL (ref 4.0–10.5)

## 2016-10-03 LAB — POCT URINE PREGNANCY: Preg Test, Ur: NEGATIVE

## 2016-10-03 LAB — LIPASE: Lipase: 91 U/L — ABNORMAL HIGH (ref 11.0–59.0)

## 2016-10-03 LAB — AMYLASE: Amylase: 85 U/L (ref 27–131)

## 2016-10-03 MED ORDER — NITROFURANTOIN MONOHYD MACRO 100 MG PO CAPS: 100.0000 mg | ORAL_CAPSULE | Freq: Two times a day (BID) | ORAL | 0 refills | Status: AC

## 2016-10-03 MED ORDER — SENNOSIDES-DOCUSATE SODIUM 8.6-50 MG PO TABS: 1.0000 | ORAL_TABLET | Freq: Two times a day (BID) | ORAL | 0 refills | Status: DC

## 2016-10-03 MED FILL — NITROFURANTOIN MONO-MCR 100: 100 | 7 days supply | Qty: 14 | Fill #0

## 2016-10-03 NOTE — Patient Instructions (Addendum)
Urinalysis indicates UTI and need for increase oral hydration. Oral abx sent. Push oral hydration.  Constipation: ABD x-ray does confirm constipation without obstruction. Start sennakot OTC 1tab BID or Miralax OTC 17g BID for constipation. Push oral hydration (water mostly). Normal liver function, cbc, and amylase. Mild elevation in lipase. With normal amylase and lipase; I encourage complete of oral abx for uti and sennakot or miralax for constipation. Repeat lipase in 1week.

## 2016-10-03 NOTE — Addendum Note (Signed)
Addended by: Alysia Penna L on: 10/03/2016 12:33 PM   Modules accepted: Orders

## 2016-10-03 NOTE — Progress Notes (Addendum)
Subjective:  Patient ID: Janet [name], female    DOB: [birth year]  Age: 41 y.o. MRN: [id number]  CC: Abdominal Pain (stomach pain--started 1 mo--painful--stomach tight up)   Abdominal Pain  This is a new problem. The current episode started 1 to 4 weeks ago. The onset quality is gradual. The problem occurs constantly. The problem has been waxing and waning. The pain is located in the generalized abdominal region. The quality of the pain is a sensation of fullness, sharp and cramping. The abdominal pain does not radiate. Associated symptoms include anorexia. Pertinent negatives include no belching or diarrhea.    ABD pain x 9month:  Woke up from sleep with pain. Tightness in ABD, some relief with GAS, no improvement with tylenol and ibuprofen. Happens daily, waxing and waning. No N/V, no diarrhea, no fever,  No food trigger. LMP 09/30/16, has been regular No birth control.  Outpatient Medications Prior to Visit  Medication Sig Dispense Refill  . methimazole (TAPAZOLE) 10 MG tablet TAKE 1 TABLET BY MOUTH DAILY. 30 tablet 1   No facility-administered medications prior to visit.     ROS See HPI  Objective:  BP 112/78   Pulse 74   Temp 97.8 F (36.6 C)   Ht 5\' 6"  (1.676 m)   Wt 184 lb (83.5 kg)   LMP 10/03/2016   SpO2 97%   BMI 29.70 kg/m   BP Readings from Last 3 Encounters:  10/03/16 112/78  05/23/16 122/84  11/30/15 110/62    Wt Readings from Last 3 Encounters:  10/03/16 184 lb (83.5 kg)  05/23/16 185 lb (83.9 kg)  11/30/15 184 lb (83.5 kg)    Physical Exam  Constitutional: She is oriented to person, place, and time. No distress.  Cardiovascular: Normal rate.   Pulmonary/Chest: Effort normal.  Abdominal: There is tenderness. There is guarding.  Neurological: She is alert and oriented to person, place, and time.  Skin: Skin is warm and dry. No rash noted. No erythema.  Vitals reviewed.   Lab Results  Component Value Date   WBC 5.4 10/03/2016   HGB  13.3 10/03/2016   HCT 39.1 10/03/2016   PLT 392.0 10/03/2016   GLUCOSE 74 03/09/2015   CHOL 163 05/23/2016   TRIG 56.0 05/23/2016   HDL 61.90 05/23/2016   LDLCALC 90 05/23/2016   ALT 15 10/03/2016   AST 16 10/03/2016   NA 137 03/09/2015   K 4.2 03/09/2015   CL 100 03/09/2015   CREATININE 0.68 03/09/2015   BUN 11 03/09/2015   CO2 24 03/09/2015   TSH 3.14 05/23/2016   HGBA1C 5.4 11/30/2015    US Soft Tissue Head And Neck  Result Date: 06/06/2016 CLINICAL DATA:  Hyperthyroidism, anterior neck pain EXAM: THYROID ULTRASOUND TECHNIQUE: Ultrasound examination of the thyroid gland and adjacent soft tissues was performed. COMPARISON:  06/11/2005 FINDINGS: Parenchymal Echotexture: Normal Isthmus: 0.5 cm Right lobe: 4.8 x 1.8 x 2.4 cm Left lobe: 5.1 x 2.0 x 2.0 cm _________________________________________________________ Estimated total number of nodules >/= 1 cm: 0 Number of spongiform nodules >/=  2 cm not described below (TR1): 0 Number of mixed cystic and solid nodules >/= 1.5 cm not described below (TR2): 0 _________________________________________________________ No discrete nodules are seen within the thyroid gland. IMPRESSION: Normal thyroid ultrasound. The above is in keeping with the ACR TI-RADS recommendations - J Am Coll Radiol 2017;14:587-595. INDICATION: Hyperthyroidism, scratched Electronically Signed   By: Judie Petit.  Shick M.D.   On: 06/06/2016 16:32    Assessment &  Plan:   Janet [name] was seen today for abdominal pain.  Diagnoses and all orders for this visit:  Generalized abdominal pain -     POCT urinalysis dipstick -     POCT urine pregnancy -     CBC w/Diff; Future -     Lipase; Future -     Hepatic function panel; Future -     Amylase; Future -     nitrofurantoin, macrocrystal-monohydrate, (MACROBID) 100 MG capsule; Take 1 capsule (100 mg total) by mouth 2 (two) times daily. -     DG Abd 1 View; Future  Acute cystitis with hematuria -     nitrofurantoin,  macrocrystal-monohydrate, (MACROBID) 100 MG capsule; Take 1 capsule (100 mg total) by mouth 2 (two) times daily.  Constipation, unspecified constipation type -     DG Abd 1 View; Future -     senna-docusate (SENOKOT-S) 8.6-50 MG tablet; Take 1 tablet by mouth 2 (two) times daily.  Elevated lipase -     Lipase; Future   I am having Janet [name] start on nitrofurantoin (macrocrystal-monohydrate) and senna-docusate. I am also having her maintain her methimazole.  Meds ordered this encounter  Medications  . nitrofurantoin, macrocrystal-monohydrate, (MACROBID) 100 MG capsule    Sig: Take 1 capsule (100 mg total) by mouth 2 (two) times daily.    Dispense:  14 capsule    Refill:  0    Order Specific Question:   Supervising Provider    Answer:   Tresa Garter [[id number]]  . senna-docusate (SENOKOT-S) 8.6-50 MG tablet    Sig: Take 1 tablet by mouth 2 (two) times daily.    Dispense:  20 tablet    Refill:  0    Order Specific Question:   Supervising Provider    Answer:   Tresa Garter [[id number]]    Follow-up: Return if symptoms worsen or fail to improve.  Alysia Penna, NP

## 2016-10-06 ENCOUNTER — Other Ambulatory Visit: Payer: Self-pay | Admitting: Nurse Practitioner

## 2016-10-06 ENCOUNTER — Telehealth: Payer: Self-pay | Admitting: Nurse Practitioner

## 2016-10-06 DIAGNOSIS — R112 Nausea with vomiting, unspecified: Secondary | ICD-10-CM

## 2016-10-06 MED ORDER — ONDANSETRON HCL 4 MG PO TABS
4.0000 mg | ORAL_TABLET | Freq: Three times a day (TID) | ORAL | 0 refills | Status: DC | PRN
Start: 2016-10-06 — End: 2016-12-05

## 2016-10-06 NOTE — Telephone Encounter (Signed)
It is not uncommon for oral abx to cause nausea and vomiting. I sent zofran for nausea. Continue sennakot for constipation and oral abx as prescribed (with food).

## 2016-10-06 NOTE — Telephone Encounter (Signed)
Pt called asking to speak with you. She said that she had some questions regarding a medication that was prescribed while she was here for her visit on Friday. She also said that she has been experiencing some nausea and vomiting since she was seen on Friday. She can be reached at [phone] [phone].

## 2016-10-06 NOTE — Telephone Encounter (Signed)
Spoke with pt, she said that she started to vomit on Friday before she took senokot and on Saturday when she started senokot 2 tab, she felt nauseous and only have 1 BM after take this med. She is unsure if this is normal because she ate before she took the abx as well. Right now still nausea and can not go since 1 BM.

## 2016-10-06 NOTE — Telephone Encounter (Signed)
Pt verbalized understand.  

## 2016-10-06 NOTE — Telephone Encounter (Signed)
Left vm for pt to call back, need to inform her of massage below. Janet [name] said she can also take miralax once a day add to senokot one in th morinng and at night to help regulate her BM until she become normal.

## 2016-10-10 ENCOUNTER — Encounter: Payer: Self-pay | Admitting: Nurse Practitioner

## 2016-10-10 ENCOUNTER — Other Ambulatory Visit (INDEPENDENT_AMBULATORY_CARE_PROVIDER_SITE_OTHER): Payer: 59

## 2016-10-10 DIAGNOSIS — R1084 Generalized abdominal pain: Secondary | ICD-10-CM

## 2016-10-10 DIAGNOSIS — R10817 Generalized abdominal tenderness: Secondary | ICD-10-CM

## 2016-10-10 DIAGNOSIS — R748 Abnormal levels of other serum enzymes: Secondary | ICD-10-CM | POA: Diagnosis not present

## 2016-10-10 LAB — LIPASE: Lipase: 77 U/L — ABNORMAL HIGH (ref 11.0–59.0)

## 2016-10-22 ENCOUNTER — Telehealth: Payer: Self-pay | Admitting: Nurse Practitioner

## 2016-10-22 NOTE — Telephone Encounter (Signed)
Radiology spoke to pt regarding CT scan and pt told her:   "Pt has been passing stools and is feeling some better she wants to wait about a month and have labs repeated. "  Please advise

## 2016-10-23 NOTE — Telephone Encounter (Signed)
Left msg for pt to call back

## 2016-10-23 NOTE — Telephone Encounter (Signed)
Another repeat lipase not necessary. If her symptoms persist, she will need to go for CT ABD as previously ordered or make appt to be seen in office.

## 2016-10-23 NOTE — Telephone Encounter (Signed)
She will need another office visit

## 2016-10-23 NOTE — Telephone Encounter (Signed)
Spoke to pt - she is wondering if she can just come in to get labs and if it's still elevated she will come in for an appointment. She has a new work schedule and it's kind of hard to take time off.  Please advise

## 2016-10-24 NOTE — Telephone Encounter (Signed)
FYI: She scheduled her CT scan for next week

## 2016-10-31 ENCOUNTER — Ambulatory Visit (INDEPENDENT_AMBULATORY_CARE_PROVIDER_SITE_OTHER)
Admission: RE | Admit: 2016-10-31 | Discharge: 2016-10-31 | Disposition: A | Discharge status: Home / Self Care | Payer: 59 | Source: Ambulatory Visit | Attending: Nurse Practitioner | Admitting: Nurse Practitioner

## 2016-10-31 ENCOUNTER — Inpatient Hospital Stay: Admission: RE | Admit: 2016-10-31 | Payer: 59 | Source: Ambulatory Visit

## 2016-10-31 DIAGNOSIS — R1084 Generalized abdominal pain: Secondary | ICD-10-CM

## 2016-10-31 DIAGNOSIS — R10817 Generalized abdominal tenderness: Secondary | ICD-10-CM

## 2016-10-31 DIAGNOSIS — R748 Abnormal levels of other serum enzymes: Secondary | ICD-10-CM

## 2016-10-31 DIAGNOSIS — R109 Unspecified abdominal pain: Secondary | ICD-10-CM | POA: Diagnosis not present

## 2016-10-31 MED ORDER — IOPAMIDOL (ISOVUE-300) INJECTION 61%: 100.0000 mL | Freq: Once | INTRAVENOUS | Status: DC | PRN

## 2016-11-21 ENCOUNTER — Ambulatory Visit: Payer: 59 | Admitting: Endocrinology

## 2016-11-28 ENCOUNTER — Encounter: Payer: 59 | Admitting: Nurse Practitioner

## 2016-12-05 ENCOUNTER — Ambulatory Visit (INDEPENDENT_AMBULATORY_CARE_PROVIDER_SITE_OTHER): Payer: 59 | Admitting: Endocrinology

## 2016-12-05 ENCOUNTER — Ambulatory Visit (INDEPENDENT_AMBULATORY_CARE_PROVIDER_SITE_OTHER): Payer: 59 | Admitting: Nurse Practitioner

## 2016-12-05 ENCOUNTER — Encounter: Payer: Self-pay | Admitting: Endocrinology

## 2016-12-05 ENCOUNTER — Encounter: Payer: Self-pay | Admitting: Nurse Practitioner

## 2016-12-05 ENCOUNTER — Other Ambulatory Visit (INDEPENDENT_AMBULATORY_CARE_PROVIDER_SITE_OTHER): Payer: 59

## 2016-12-05 ENCOUNTER — Encounter: Payer: 59 | Admitting: Nurse Practitioner

## 2016-12-05 VITALS — BP 118/78 | HR 77 | Wt 186.8 lb

## 2016-12-05 VITALS — BP 110/78 | HR 85 | Temp 97.9°F | Ht 66.0 in | Wt 187.0 lb

## 2016-12-05 DIAGNOSIS — R748 Abnormal levels of other serum enzymes: Secondary | ICD-10-CM | POA: Diagnosis not present

## 2016-12-05 DIAGNOSIS — Z136 Encounter for screening for cardiovascular disorders: Secondary | ICD-10-CM

## 2016-12-05 DIAGNOSIS — E0580 Other thyrotoxicosis without thyrotoxic crisis or storm: Secondary | ICD-10-CM | POA: Diagnosis not present

## 2016-12-05 DIAGNOSIS — Z0001 Encounter for general adult medical examination with abnormal findings: Secondary | ICD-10-CM | POA: Diagnosis not present

## 2016-12-05 DIAGNOSIS — Z13220 Encounter for screening for lipoid disorders: Secondary | ICD-10-CM

## 2016-12-05 DIAGNOSIS — Z0000 Encounter for general adult medical examination without abnormal findings: Secondary | ICD-10-CM

## 2016-12-05 LAB — LIPID PANEL
CHOL/HDL RATIO: 2
Cholesterol: 143 mg/dL (ref 0–200)
HDL: 59.6 mg/dL (ref >39.00)
LDL CALC: 71 mg/dL (ref 0–99)
NonHDL: 83.81
Triglycerides: 64 mg/dL (ref 0.0–149.0)
VLDL: 12.8 mg/dL (ref 0.0–40.0)

## 2016-12-05 LAB — COMPREHENSIVE METABOLIC PANEL
ALT: 14 U/L (ref 0–35)
AST: 12 U/L (ref 0–37)
Albumin: 4.2 g/dL (ref 3.5–5.2)
Alkaline Phosphatase: 61 U/L (ref 39–117)
BUN: 11 mg/dL (ref 6–23)
CHLORIDE: 103 meq/L (ref 96–112)
CO2: 28 meq/L (ref 19–32)
CREATININE: 0.69 mg/dL (ref 0.40–1.20)
Calcium: 9.1 mg/dL (ref 8.4–10.5)
GFR: 121.37 mL/min (ref >60.00)
GLUCOSE: 83 mg/dL (ref 70–99)
Potassium: 3.9 mEq/L (ref 3.5–5.1)
SODIUM: 138 meq/L (ref 135–145)
Total Bilirubin: 0.4 mg/dL (ref 0.2–1.2)
Total Protein: 7.4 g/dL (ref 6.0–8.3)

## 2016-12-05 LAB — CBC
HEMATOCRIT: 39.5 % (ref 36.0–46.0)
Hemoglobin: 13.2 g/dL (ref 12.0–15.0)
MCHC: 33.4 g/dL (ref 30.0–36.0)
MCV: 86.1 fl (ref 78.0–100.0)
Platelets: 384 10*3/uL (ref 150.0–400.0)
RBC: 4.59 Mil/uL (ref 3.87–5.11)
RDW: 14.3 % (ref 11.5–15.5)
WBC: 6.7 10*3/uL (ref 4.0–10.5)

## 2016-12-05 LAB — LIPASE: LIPASE: 64 U/L — AB (ref 11.0–59.0)

## 2016-12-05 LAB — AMYLASE: AMYLASE: 79 U/L (ref 27–131)

## 2016-12-05 LAB — TSH: TSH: 2.26 u[IU]/mL (ref 0.35–4.50)

## 2016-12-05 NOTE — Progress Notes (Signed)
Subjective:    Patient ID: Janet [name], female    DOB: 1975-09-08, 41 y.o.   MRN: [id number]  Patient presents today for complete physical  HPI  Elevated lipase: ABD pain has completely resolved. Constipation resolved after use of laxative. BMs is regular She will like lipase level repeated. CT ABD/pelvis with contrast was normal.  Immunizations: (TDAP, Hep C screen, Pneumovax, Influenza, zoster)  Health Maintenance  Topic Date Due  . Pap Smear  06/09/2016  . HIV Screening  12/01/2017*  . Tetanus Vaccine  04/20/2023  . Flu Shot  Addressed  *Topic was postponed. The date shown is not the original due date.   Diet:regular.  Weight:  Wt Readings from Last 3 Encounters:  12/05/16 187 lb (84.8 kg)  12/05/16 186 lb 12.8 oz (84.7 kg)  10/03/16 184 lb (83.5 kg)   Exercise:none.  Fall Risk: Fall Risk  12/05/2016 04/19/2013  Falls in the past year? No No   Home Safety:home with husband.  Depression/Suicide: Depression screen [facility] 2/9 12/05/2016 04/19/2013  Decreased Interest 0 0  Down, Depressed, Hopeless 0 0  PHQ - 2 Score 0 0   Pap Smear (every 39yrs for >21-29 without HPV, every 77yrs for >30-58yrs with HPV):needed, she will schedule with GYN  Vision:needed, will schedule.  Dental:needed, she will schedule.  Advanced Directive: Advanced Directives 10/22/2013  Does Patient Have a Medical Advance Directive? No  Would patient like information on creating a medical advance directive? No - patient declined information   Sexual History (birth control, marital status, STD):married, sexually active.  Medications and allergies reviewed with patient and updated if appropriate.  Patient Active Problem List   Diagnosis Date Noted  . Elevated lipase 12/08/2016  . Wellness examination 02/15/2015  . Pre-employment examination 04/19/2013  . Back pain 11/25/2012  . Abdominal tenderness 11/25/2012  . Dyspnea 04/25/2011  . Palpitations 04/25/2011  . Paresthesia 12/30/2010   . Preventative health care 12/30/2010  . Right carpal tunnel syndrome 12/30/2010  . HYPERSOMNIA 04/10/2010  . Cough 03/05/2009  . LEUKOPENIA, MILD 09/21/2008  . FATIGUE 01/19/2008  . Thyrotoxicosis 08/03/2006    Current Outpatient Prescriptions on File Prior to Visit  Medication Sig Dispense Refill  . methimazole (TAPAZOLE) 10 MG tablet TAKE 1 TABLET BY MOUTH DAILY. 30 tablet 1   No current facility-administered medications on file prior to visit.     Past Medical History:  Diagnosis Date  . HYPERSOMNIA 04/10/2010  . HYPERTHYROIDISM 08/03/2006    Past Surgical History:  Procedure Laterality Date  . CESAREAN SECTION     x3  . DILATION AND CURETTAGE OF UTERUS    . TUBAL LIGATION      Social History   Social History  . Marital status: Married    Spouse name: N/A  . Number of children: 3  . Years of education: N/A   Social History Main Topics  . Smoking status: Never Smoker  . Smokeless tobacco: Never Used  . Alcohol use No  . Drug use: No  . Sexual activity: Yes    Partners: Male    Birth control/ protection: Surgical     Comment: Tubal   Other Topics Concern  . 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 Hypertension Mother   . Hyperlipidemia Mother   . Sleep apnea Mother   . Thyroid disease Mother   . Stroke Mother   . Cancer Other  Aunt with Cancer- unknown type  . Lupus Other        Uncle  . Cancer Other        Lung Cancer-Uncle        Review of Systems  Constitutional: Negative for fever, malaise/fatigue and weight loss.  HENT: Negative for congestion and sore throat.   Eyes:       Negative for visual changes  Respiratory: Negative for cough and shortness of breath.   Cardiovascular: Negative for chest pain, palpitations and leg swelling.  Gastrointestinal: Negative for blood in stool, constipation, diarrhea and heartburn.  Genitourinary: Negative for dysuria, frequency  and urgency.  Musculoskeletal: Negative for falls, joint pain and myalgias.  Skin: Negative for rash.  Neurological: Negative for dizziness, sensory change and headaches.  Endo/Heme/Allergies: Does not bruise/bleed easily.  Psychiatric/Behavioral: Negative for depression, substance abuse and suicidal ideas. The patient is not nervous/anxious and does not have insomnia.     Objective:   Vitals:   12/05/16 1424  BP: 110/78  Pulse: 85  Temp: 97.9 F (36.6 C)  SpO2: 99%    Body mass index is 30.18 kg/m.   Physical Examination:  Physical Exam  Constitutional: She is oriented to person, place, and time and well-developed, well-nourished, and in no distress. No distress.  HENT:  Right Ear: External ear normal.  Left Ear: External ear normal.  Nose: Nose normal.  Mouth/Throat: Oropharynx is clear and moist. No oropharyngeal exudate.  Eyes: Pupils are equal, round, and reactive to light. Conjunctivae and EOM are normal. No scleral icterus.  Neck: Normal range of motion. Neck supple. No thyromegaly present.  Cardiovascular: Normal rate, normal heart sounds and intact distal pulses.   Pulmonary/Chest: Effort normal and breath sounds normal. She exhibits no tenderness.  Breast exam deferred to GYN  Abdominal: Soft. Bowel sounds are normal. She exhibits no distension and no mass. There is no tenderness. There is no rebound and no guarding.  Genitourinary:  Genitourinary Comments: Pelvic exam deferred to GYN  Musculoskeletal: Normal range of motion. She exhibits no edema or tenderness.  Lymphadenopathy:    She has no cervical adenopathy.  Neurological: She is alert and oriented to person, place, and time. Gait normal.  Skin: Skin is warm and dry.  Psychiatric: Affect and judgment normal.    ASSESSMENT and PLAN:  Janet [name] was seen today for annual exam.  Diagnoses and all orders for this visit:  Encounter for preventative adult health care exam with abnormal findings -      Comprehensive metabolic panel; Future -     Lipid panel; Future -     CBC; Future  Elevated lipase -     Lipase; Future -     Amylase; Future  Encounter for lipid screening for cardiovascular disease -     Lipid panel; Future    No problem-specific Assessment & Plan notes found for this encounter.      Follow up: Return in about 1 year (around 12/05/2017) for CPE (Fasting) .  Alysia Pennaharlotte Yanky Vanderburg, NP

## 2016-12-05 NOTE — Patient Instructions (Addendum)
Normal labs except persistent elevation Iipase. Typically this is related to acute or chronic pancreatitis and alcohol consumption. Since CT ABD/pelvis with contrast was normal, I will like to refer to GI for further evaluation. Let me know if this is ok with you Also abstain from any ETOH consumption.  contact GYN about repeat pap and breast exam.  Health Maintenance, Female Adopting a healthy lifestyle and getting preventive care can go a long way to promote health and wellness. Talk with your health care provider about what schedule of regular examinations is right for you. This is a good chance for you to check in with your provider about disease prevention and staying healthy. In between checkups, there are plenty of things you can do on your own. Experts have done a lot of research about which lifestyle changes and preventive measures are most likely to keep you healthy. Ask your health care provider for more information. Weight and diet Eat a healthy diet  Be sure to include plenty of vegetables, fruits, low-fat dairy products, and lean protein.  Do not eat a lot of foods high in solid fats, added sugars, or salt.  Get regular exercise. This is one of the most important things you can do for your health. ? Most adults should exercise for at least 150 minutes each week. The exercise should increase your heart rate and make you sweat (moderate-intensity exercise). ? Most adults should also do strengthening exercises at least twice a week. This is in addition to the moderate-intensity exercise.  Maintain a healthy weight  Body mass index (BMI) is a measurement that can be used to identify possible weight problems. It estimates body fat based on height and weight. Your health care provider can help determine your BMI and help you achieve or maintain a healthy weight.  For females 81 years of age and older: ? A BMI below 18.5 is considered underweight. ? A BMI of 18.5 to 24.9 is  normal. ? A BMI of 25 to 29.9 is considered overweight. ? A BMI of 30 and above is considered obese.  Watch levels of cholesterol and blood lipids  You should start having your blood tested for lipids and cholesterol at 39 years of age, then have this test every 5 years.  You may need to have your cholesterol levels checked more often if: ? Your lipid or cholesterol levels are high. ? You are older than 39 years of age. ? You are at high risk for heart disease.  Cancer screening Lung Cancer  Lung cancer screening is recommended for adults 46-49 years old who are at high risk for lung cancer because of a history of smoking.  A yearly low-dose CT scan of the lungs is recommended for people who: ? Currently smoke. ? Have quit within the past 15 years. ? Have at least a 30-pack-year history of smoking. A pack year is smoking an average of one pack of cigarettes a day for 1 year.  Yearly screening should continue until it has been 15 years since you quit.  Yearly screening should stop if you develop a health problem that would prevent you from having lung cancer treatment.  Breast Cancer  Practice breast self-awareness. This means understanding how your breasts normally appear and feel.  It also means doing regular breast self-exams. Let your health care provider know about any changes, no matter how small.  If you are in your 20s or 30s, you should have a clinical breast exam (CBE) by a  health care provider every 1-3 years as part of a regular health exam.  If you are 50 or older, have a CBE every year. Also consider having a breast X-ray (mammogram) every year.  If you have a family history of breast cancer, talk to your health care provider about genetic screening.  If you are at high risk for breast cancer, talk to your health care provider about having an MRI and a mammogram every year.  Breast cancer gene (BRCA) assessment is recommended for women who have family members  with BRCA-related cancers. BRCA-related cancers include: ? Breast. ? Ovarian. ? Tubal. ? Peritoneal cancers.  Results of the assessment will determine the need for genetic counseling and BRCA1 and BRCA2 testing.  Cervical Cancer Your health care provider may recommend that you be screened regularly for cancer of the pelvic organs (ovaries, uterus, and vagina). This screening involves a pelvic examination, including checking for microscopic changes to the surface of your cervix (Pap test). You may be encouraged to have this screening done every 3 years, beginning at age 67.  For women ages 65-65, health care providers may recommend pelvic exams and Pap testing every 3 years, or they may recommend the Pap and pelvic exam, combined with testing for human papilloma virus (HPV), every 5 years. Some types of HPV increase your risk of cervical cancer. Testing for HPV may also be done on women of any age with unclear Pap test results.  Other health care providers may not recommend any screening for nonpregnant women who are considered low risk for pelvic cancer and who do not have symptoms. Ask your health care provider if a screening pelvic exam is right for you.  If you have had past treatment for cervical cancer or a condition that could lead to cancer, you need Pap tests and screening for cancer for at least 20 years after your treatment. If Pap tests have been discontinued, your risk factors (such as having a new sexual partner) need to be reassessed to determine if screening should resume. Some women have medical problems that increase the chance of getting cervical cancer. In these cases, your health care provider may recommend more frequent screening and Pap tests.  Colorectal Cancer  This type of cancer can be detected and often prevented.  Routine colorectal cancer screening usually begins at 39 years of age and continues through 39 years of age.  Your health care provider may recommend  screening at an earlier age if you have risk factors for colon cancer.  Your health care provider may also recommend using home test kits to check for hidden blood in the stool.  A small camera at the end of a tube can be used to examine your colon directly (sigmoidoscopy or colonoscopy). This is done to check for the earliest forms of colorectal cancer.  Routine screening usually begins at age 38.  Direct examination of the colon should be repeated every 5-10 years through 39 years of age. However, you may need to be screened more often if early forms of precancerous polyps or small growths are found.  Skin Cancer  Check your skin from head to toe regularly.  Tell your health care provider about any new moles or changes in moles, especially if there is a change in a mole's shape or color.  Also tell your health care provider if you have a mole that is larger than the size of a pencil eraser.  Always use sunscreen. Apply sunscreen liberally and repeatedly throughout  the day.  Protect yourself by wearing long sleeves, pants, a wide-brimmed hat, and sunglasses whenever you are outside.  Heart disease, diabetes, and high blood pressure  High blood pressure causes heart disease and increases the risk of stroke. High blood pressure is more likely to develop in: ? People who have blood pressure in the high end of the normal range (130-139/85-89 mm Hg). ? People who are overweight or obese. ? People who are African American.  If you are 29-29 years of age, have your blood pressure checked every 3-5 years. If you are 63 years of age or older, have your blood pressure checked every year. You should have your blood pressure measured twice-once when you are at a hospital or clinic, and once when you are not at a hospital or clinic. Record the average of the two measurements. To check your blood pressure when you are not at a hospital or clinic, you can use: ? An automated blood pressure machine at  a pharmacy. ? A home blood pressure monitor.  If you are between 28 years and 50 years old, ask your health care provider if you should take aspirin to prevent strokes.  Have regular diabetes screenings. This involves taking a blood sample to check your fasting blood sugar level. ? If you are at a normal weight and have a low risk for diabetes, have this test once every three years after 39 years of age. ? If you are overweight and have a high risk for diabetes, consider being tested at a younger age or more often. Preventing infection Hepatitis B  If you have a higher risk for hepatitis B, you should be screened for this virus. You are considered at high risk for hepatitis B if: ? You were born in a country where hepatitis B is common. Ask your health care provider which countries are considered high risk. ? Your parents were born in a high-risk country, and you have not been immunized against hepatitis B (hepatitis B vaccine). ? You have HIV or AIDS. ? You use needles to inject street drugs. ? You live with someone who has hepatitis B. ? You have had sex with someone who has hepatitis B. ? You get hemodialysis treatment. ? You take certain medicines for conditions, including cancer, organ transplantation, and autoimmune conditions.  Hepatitis C  Blood testing is recommended for: ? Everyone born from [birth year] through 1963. ? Anyone with known risk factors for hepatitis C.  Sexually transmitted infections (STIs)  You should be screened for sexually transmitted infections (STIs) including gonorrhea and chlamydia if: ? You are sexually active and are younger than 39 years of age. ? You are older than 39 years of age and your health care provider tells you that you are at risk for this type of infection. ? Your sexual activity has changed since you were last screened and you are at an increased risk for chlamydia or gonorrhea. Ask your health care provider if you are at risk.  If you do  not have HIV, but are at risk, it may be recommended that you take a prescription medicine daily to prevent HIV infection. This is called pre-exposure prophylaxis (PrEP). You are considered at risk if: ? You are sexually active and do not regularly use condoms or know the HIV status of your partner(s). ? You take drugs by injection. ? You are sexually active with a partner who has HIV.  Talk with your health care provider about whether you are at  high risk of being infected with HIV. If you choose to begin PrEP, you should first be tested for HIV. You should then be tested every 3 months for as long as you are taking PrEP. Pregnancy  If you are premenopausal and you may become pregnant, ask your health care provider about preconception counseling.  If you may become pregnant, take 400 to 800 micrograms (mcg) of folic acid every day.  If you want to prevent pregnancy, talk to your health care provider about birth control (contraception). Osteoporosis and menopause  Osteoporosis is a disease in which the bones lose minerals and strength with aging. This can result in serious bone fractures. Your risk for osteoporosis can be identified using a bone density scan.  If you are 42 years of age or older, or if you are at risk for osteoporosis and fractures, ask your health care provider if you should be screened.  Ask your health care provider whether you should take a calcium or vitamin D supplement to lower your risk for osteoporosis.  Menopause may have certain physical symptoms and risks.  Hormone replacement therapy may reduce some of these symptoms and risks. Talk to your health care provider about whether hormone replacement therapy is right for you. Follow these instructions at home:  Schedule regular health, dental, and eye exams.  Stay current with your immunizations.  Do not use any tobacco products including cigarettes, chewing tobacco, or electronic cigarettes.  If you are  pregnant, do not drink alcohol.  If you are breastfeeding, limit how much and how often you drink alcohol.  Limit alcohol intake to no more than 1 drink per day for nonpregnant women. One drink equals 12 ounces of beer, 5 ounces of wine, or 1 ounces of hard liquor.  Do not use street drugs.  Do not share needles.  Ask your health care provider for help if you need support or information about quitting drugs.  Tell your health care provider if you often feel depressed.  Tell your health care provider if you have ever been abused or do not feel safe at home. This information is not intended to replace advice given to you by your health care provider. Make sure you discuss any questions you have with your health care provider. Document Released: 08/12/2010 Document Revised: 07/05/2015 Document Reviewed: 10/31/2014 Elsevier Interactive Patient Education  [employer].

## 2016-12-05 NOTE — Progress Notes (Signed)
   Subjective:    Patient ID: Janet [name], female    DOB: 11-28-1975, 41 y.o.   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  She takes tapazole as rx'ed.  pt states she feels well in general, except for palpitations Past Medical History:  Diagnosis Date  . HYPERSOMNIA 04/10/2010  . HYPERTHYROIDISM 08/03/2006    Past Surgical History:  Procedure Laterality Date  . CESAREAN SECTION     x3  . DILATION AND CURETTAGE OF UTERUS    . TUBAL LIGATION      Social History   Social History  . Marital status: Married    Spouse name: N/A  . Number of children: 3  . Years of education: N/A   Occupational History  . Not on file.   Social History Main Topics  . Smoking status: Never Smoker  . Smokeless tobacco: Never Used  . Alcohol use No  . Drug use: No  . Sexual activity: Yes    Partners: Male    Birth control/ protection: Surgical     Comment: Tubal   Other Topics Concern  . 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 methimazole (TAPAZOLE) 10 MG tablet TAKE 1 TABLET BY MOUTH DAILY. 30 tablet 1   No current facility-administered medications on file prior to visit.     Allergies  Allergen Reactions  . Sulfa Antibiotics Shortness Of Breath and Swelling    Family History  Problem Relation Age of Onset  . Hypertension Mother   . Hyperlipidemia Mother   . Sleep apnea Mother   . Thyroid disease Mother   . Stroke Mother   . Cancer Other        Aunt with Cancer- unknown type  . Lupus Other        Uncle  . Cancer Other        Lung Cancer-Uncle    BP 118/78   Pulse 77   Wt 186 lb 12.8 oz (84.7 kg)   SpO2 98%   BMI 30.15 kg/m    Review of  Systems Denies fever    Objective:   Physical Exam VITAL SIGNS:  See vs page GENERAL: no distress NECK: There is no palpable thyroid enlargement.  No thyroid nodule is palpable.  No palpable lymphadenopathy at the anterior neck.      Assessment & Plan:  Hyperthyroidism: due for recheck.    Patient Instructions  blood tests are requested for you today.  We'll let you know about the results.  if ever you have fever while taking methimazole, stop it and call us, because of the risk of a rare side-effect.  Let's check the ultrasound.  you will receive a phone call, about a day and time for an appointment.   Please come back for a follow-up appointment in 6 months.

## 2016-12-05 NOTE — Patient Instructions (Addendum)
blood tests are requested for you today.  We'll let you know about the results.  if ever you have fever while taking methimazole, stop it and call us, because of the risk of a rare side-effect.  Let's check the ultrasound.  you will receive a phone call, about a day and time for an appointment.   Please come back for a follow-up appointment in 6 months.

## 2016-12-08 DIAGNOSIS — R748 Abnormal levels of other serum enzymes: Secondary | ICD-10-CM | POA: Insufficient documentation

## 2016-12-22 ENCOUNTER — Telehealth: Payer: Self-pay | Admitting: Obstetrics and Gynecology

## 2016-12-22 NOTE — Telephone Encounter (Signed)
Patient called states she is having discharge.  Offered tomorrow declined states she wants Thursday or Friday.

## 2016-12-22 NOTE — Telephone Encounter (Signed)
Left message to call Shubham Thackston at [phone]. 

## 2016-12-22 NOTE — Telephone Encounter (Signed)
Spoke with patient. Patient states that she is having vaginal discharge and would like to be seen for evaluation. Appointment scheduled for 12/23/2016 at 2 pm with Dr.Jertson. Patient is agreeable to date and time.  Routing to provider for final review. Patient agreeable to disposition. Will close encounter.

## 2016-12-23 ENCOUNTER — Other Ambulatory Visit: Payer: Self-pay

## 2016-12-23 ENCOUNTER — Ambulatory Visit: Payer: 59 | Admitting: Obstetrics and Gynecology

## 2016-12-23 ENCOUNTER — Encounter: Payer: Self-pay | Admitting: Obstetrics and Gynecology

## 2016-12-23 VITALS — BP 102/62 | HR 84 | Resp 18 | Wt 187.0 lb

## 2016-12-23 DIAGNOSIS — R3989 Other symptoms and signs involving the genitourinary system: Secondary | ICD-10-CM | POA: Diagnosis not present

## 2016-12-23 DIAGNOSIS — N766 Ulceration of vulva: Secondary | ICD-10-CM

## 2016-12-23 DIAGNOSIS — N898 Other specified noninflammatory disorders of vagina: Secondary | ICD-10-CM

## 2016-12-23 LAB — POCT URINALYSIS DIPSTICK
Bilirubin, UA: NEGATIVE
Blood, UA: NEGATIVE
Glucose, UA: NEGATIVE
KETONES UA: NEGATIVE
Leukocytes, UA: NEGATIVE
Nitrite, UA: NEGATIVE
PH UA: 7.5 (ref 5.0–8.0)
PROTEIN UA: NEGATIVE
Urobilinogen, UA: NEGATIVE E.U./dL — AB

## 2016-12-23 MED FILL — VALACYCLOVIR HCL 500 MG TAB: 500 | 15 days supply | Qty: 30 | Fill #0

## 2016-12-23 NOTE — Addendum Note (Signed)
Addended by: Shelda JakesHANNER, Hiawatha Dressel E on: 12/23/2016 03:16 PM   Modules accepted: Orders

## 2016-12-23 NOTE — Progress Notes (Addendum)
GYNECOLOGY  VISIT   HPI: 41 y.o.   Married  [place]   [phone] with Patient's last menstrual period was 12/02/2016.   here c/o vaginal itching and discharge. She c/o a couple of day history of vaginal discharge, white, unsure if there is an odor with her D/C or with her urine. She has had BV in the past and has had some pelvic cramping with that. She had mild cramping this morning.  She c/o vulvar itching a few days ago, she noticed erythema when she looked, not sure if there is a bump.  She does get recurrent tender bumps on one side (left) in the last year. Husband has a distant h/o hsv. Not getting regular outbreaks.      GYNECOLOGIC HISTORY: Patient's last menstrual period was 12/02/2016. Contraception: tubal ligation Menopausal hormone therapy: none        OB History    Gravida Para Term Preterm AB Living   5 3 3   2 3    SAB TAB Ectopic Multiple Live Births   2       3         Patient Active Problem List   Diagnosis Date Noted  . Elevated lipase 12/08/2016  . Wellness examination 02/15/2015  . Pre-employment examination 04/19/2013  . Back pain 11/25/2012  . Abdominal tenderness 11/25/2012  . Dyspnea 04/25/2011  . Palpitations 04/25/2011  . Paresthesia 12/30/2010  . Preventative health care 12/30/2010  . Right carpal tunnel syndrome 12/30/2010  . HYPERSOMNIA 04/10/2010  . Cough 03/05/2009  . LEUKOPENIA, MILD 09/21/2008  . FATIGUE 01/19/2008  . Thyrotoxicosis 08/03/2006    Past Medical History:  Diagnosis Date  . HYPERSOMNIA 04/10/2010  . HYPERTHYROIDISM 08/03/2006    Past Surgical History:  Procedure Laterality Date  . CESAREAN SECTION     x3  . DILATION AND CURETTAGE OF UTERUS    . TUBAL LIGATION      Current Outpatient Medications  Medication Sig Dispense Refill  . methimazole (TAPAZOLE) 10 MG tablet TAKE 1 TABLET BY MOUTH DAILY. 30 tablet 1   No current facility-administered medications for this visit.      ALLERGIES: Sulfa  antibiotics  Family History  Problem Relation Age of Onset  . Hypertension Mother   . Hyperlipidemia Mother   . Sleep apnea Mother   . Thyroid disease Mother   . Stroke Mother   . Cancer Other        Aunt with Cancer- unknown type  . Lupus Other        Uncle  . Cancer Other        Lung Cancer-Uncle    Social History   Socioeconomic History  . Marital status: Married    Spouse name: Not on file  . Number of children: 3  . Years of education: Not on file  . Highest education level: Not on file  Social Needs  . Financial resource strain: Not on file  . Food insecurity - worry: Not on file  . Food insecurity - inability: Not on file  . Transportation needs - medical: Not on file  . Transportation needs - non-medical: Not on file  Occupational History  . Not on file  Tobacco Use  . Smoking status: Never Smoker  . Smokeless tobacco: Never Used  Substance and Sexual Activity  . Alcohol use: No    Alcohol/week: 0.0 oz  . Drug use: No  . Sexual activity: Yes    Partners: Male  Birth control/protection: Surgical    Comment: Tubal  Other Topics Concern  . 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   HENT: Negative.   Eyes: Negative.   Respiratory: Negative.   Cardiovascular: Negative.   Gastrointestinal: Negative.   Genitourinary:       Vaginal itching and discharge  Musculoskeletal: Negative.   Skin: Negative.   Neurological: Negative.   Endo/Heme/Allergies: Negative.   Psychiatric/Behavioral: Negative.     PHYSICAL EXAMINATION:    BP 102/62 (BP Location: Right Arm, Patient Position: Sitting, Cuff Size: Normal)   Pulse 84   Resp 18   Wt 187 lb (84.8 kg)   LMP 12/02/2016   BMI 30.18 kg/m     General appearance: alert, cooperative and appears stated age Abdomen: soft, mildly tender in the suprapubic region, no rebound, no guarding; non distended, no masses,  no organomegaly  Pelvic:  External genitalia:  no lesions              Urethra:  normal appearing urethra with no masses, tenderness or lesions              Bartholins and Skenes: normal                 Vagina: normal appearing vagina with normal color and discharge, no lesions              Cervix: no lesions              Bimanual Exam:  Uterus:  normal size, contour, position, consistency, mobility, non-tender              Adnexa: no mass, fullness, tenderness              Bladder tender to palpation  Chaperone was present for exam.  Wet prep: no clue, no trich, + wbc KOH: no yeast PH: 4   ASSESSMENT Genital ulcer, most c/w HSV Vaginal discharge, negative vaginal slides Bladder tenderness Odor, ? Urine or vaginal discharge    PLAN Send HSV culture Will give a written script for Valtrex, she doesn't want any of her co-workers in [facility] to see her personal information If culture negative will return for serology Affirm Check urine dip, if + send for ua, c&s   An After Visit Summary was printed and given to the patient.  ~25 minutes was spent face to face with the patient, over 50% in counseling   Addendum: error above, 2 small ulcers noted on the left labia majora. Concerning for HSV.

## 2016-12-24 LAB — VAGINITIS/VAGINOSIS, DNA PROBE
Candida Species: NEGATIVE
Gardnerella vaginalis: NEGATIVE
TRICHOMONAS VAG: NEGATIVE

## 2016-12-25 LAB — HERPES SIMPLEX VIRUS CULTURE

## 2016-12-29 ENCOUNTER — Telehealth: Payer: Self-pay

## 2016-12-29 DIAGNOSIS — N766 Ulceration of vulva: Secondary | ICD-10-CM

## 2016-12-29 NOTE — Telephone Encounter (Signed)
Left message to call [name] at [phone]. 

## 2016-12-29 NOTE — Telephone Encounter (Signed)
-----   Message from Romualdo BolkJill Evelyn Jertson, MD sent at 12/25/2016  5:48 PM EST ----- Please let the patient know that her hsv culture was negative, but they can be falsely negative. She should come in for HSV serology. HSV IgG I and II. Please set this up

## 2016-12-29 NOTE — Telephone Encounter (Signed)
Spoke with patient. Advised of results as seen below from Dr.Jertson. Patient verbalizes understanding. Lab appointment scheduled for 12/31/2016 at 1:30 pm. Encounter closed.

## 2016-12-30 ENCOUNTER — Other Ambulatory Visit: Payer: Self-pay | Admitting: Endocrinology

## 2016-12-30 ENCOUNTER — Encounter: Payer: Self-pay | Admitting: Endocrinology

## 2016-12-31 ENCOUNTER — Other Ambulatory Visit (INDEPENDENT_AMBULATORY_CARE_PROVIDER_SITE_OTHER): Payer: 59

## 2016-12-31 ENCOUNTER — Other Ambulatory Visit: Payer: Self-pay

## 2016-12-31 DIAGNOSIS — N766 Ulceration of vulva: Secondary | ICD-10-CM

## 2016-12-31 MED FILL — methIMAzole 10 MG TABS: 10 | 90 days supply | Qty: 90 | Fill #0

## 2016-12-31 NOTE — Telephone Encounter (Signed)
Pt needs refills on methimazole to the [facility]

## 2017-01-01 LAB — HSV(HERPES SIMPLEX VRS) I + II AB-IGG
HSV 1 Glycoprotein G Ab, IgG: 50.9 index — ABNORMAL HIGH (ref 0.00–0.90)
HSV 2 IGG, TYPE SPEC: 8.76 {index} — AB (ref 0.00–0.90)

## 2017-02-05 ENCOUNTER — Ambulatory Visit: Payer: PRIVATE HEALTH INSURANCE | Admitting: Obstetrics and Gynecology

## 2017-02-09 ENCOUNTER — Other Ambulatory Visit: Payer: Self-pay

## 2017-02-10 DIAGNOSIS — R7989 Other specified abnormal findings of blood chemistry: Secondary | ICD-10-CM

## 2017-02-10 HISTORY — DX: Other specified abnormal findings of blood chemistry: R79.89

## 2017-02-19 ENCOUNTER — Telehealth: Payer: Self-pay | Admitting: Endocrinology

## 2017-02-19 NOTE — Telephone Encounter (Signed)
I don't have the form

## 2017-02-19 NOTE — Telephone Encounter (Signed)
Patient is calling on the status of the short term disability form that was sent to Everardo Allllison to be filled out, it was sent 02/06/17,  it is needing to be filled out, or it will be denied.  Please advise

## 2017-02-19 NOTE — Telephone Encounter (Signed)
I called and spoke with patient who gave form to [place]. I have located form & it's been given to Dr. Everardo AllEllison to fill out.

## 2017-02-20 DIAGNOSIS — E0590 Thyrotoxicosis, unspecified without thyrotoxic crisis or storm: Secondary | ICD-10-CM

## 2017-02-23 ENCOUNTER — Telehealth: Payer: Self-pay

## 2017-02-23 NOTE — Telephone Encounter (Signed)
I called patient to notify her that paperwork was done &r eady to be picked up. She asked if I would fax for her & her pick up the copy. [facility] to [facility] [employer] [facility] [employer].

## 2017-03-06 ENCOUNTER — Telehealth: Payer: Self-pay | Admitting: Endocrinology

## 2017-03-06 NOTE — Telephone Encounter (Signed)
Patient needs most recent lab results and latest notes from last visit-The form that was filled out for pt's insurance was denied. She needs the above documentation to send in for an appeal. Please call patient at ph# [phone] when ready to pick up-her husband will pick up.

## 2017-03-09 NOTE — Telephone Encounter (Signed)
I called and left patient VM that I had printed off last OV notes & labs for her to pick up at our office.

## 2017-04-10 ENCOUNTER — Encounter: Payer: Self-pay | Admitting: Nurse Practitioner

## 2017-04-16 ENCOUNTER — Encounter: Payer: Self-pay | Admitting: Family Medicine

## 2017-04-16 ENCOUNTER — Ambulatory Visit: Payer: 59 | Admitting: Family Medicine

## 2017-04-16 ENCOUNTER — Ambulatory Visit (INDEPENDENT_AMBULATORY_CARE_PROVIDER_SITE_OTHER): Payer: 59

## 2017-04-16 VITALS — BP 110/80 | HR 84 | Ht 66.0 in | Wt 182.0 lb

## 2017-04-16 DIAGNOSIS — K529 Noninfective gastroenteritis and colitis, unspecified: Secondary | ICD-10-CM | POA: Diagnosis not present

## 2017-04-16 DIAGNOSIS — S46819A Strain of other muscles, fascia and tendons at shoulder and upper arm level, unspecified arm, initial encounter: Secondary | ICD-10-CM | POA: Insufficient documentation

## 2017-04-16 DIAGNOSIS — M546 Pain in thoracic spine: Secondary | ICD-10-CM | POA: Diagnosis not present

## 2017-04-16 HISTORY — DX: Noninfective gastroenteritis and colitis, unspecified: K52.9

## 2017-04-16 MED ORDER — METHOCARBAMOL 500 MG PO TABS: 500.0000 mg | ORAL_TABLET | Freq: Three times a day (TID) | ORAL | 0 refills | Status: DC | PRN

## 2017-04-16 MED ORDER — PROMETHAZINE HCL 25 MG PO TABS
25.0000 mg | ORAL_TABLET | Freq: Four times a day (QID) | ORAL | 0 refills | Status: DC | PRN
Start: 2017-04-16 — End: 2017-05-12

## 2017-04-16 MED FILL — METHOCARBAMOL 500 MG TABS: 500 | 10 days supply | Qty: 30 | Fill #0

## 2017-04-16 NOTE — Patient Instructions (Addendum)
Viral Gastroenteritis, Adult Viral gastroenteritis is also known as the stomach flu. This condition is caused by certain germs (viruses). These germs can be passed from person to person very easily (are very contagious). This condition can cause sudden watery poop (diarrhea), fever, and throwing up (vomiting). Having watery poop and throwing up can make you feel weak and cause you to get dehydrated. Dehydration can make you tired and thirsty, make you have a dry mouth, and make it so you pee (urinate) less often. Older adults and people with other diseases or a weak defense system (immune system) are at higher risk for dehydration. It is important to replace the fluids that you lose from having watery poop and throwing up. Follow these instructions at home: Follow instructions from your doctor about how to care for yourself at home. Eating and drinking  Follow these instructions as told by your doctor:  Take an oral rehydration solution (ORS). This is a drink that is sold at pharmacies and stores.  Drink clear fluids in small amounts as you are able, such as: ? Water. ? Ice chips. ? Diluted fruit juice. ? Low-calorie sports drinks.  Eat bland, easy-to-digest foods in small amounts as you are able, such as: ? Bananas. ? Applesauce. ? Rice. ? Low-fat (lean) meats. ? Toast. ? Crackers.  Avoid fluids that have a lot of sugar or caffeine in them.  Avoid alcohol.  Avoid spicy or fatty foods.  General instructions  Drink enough fluid to keep your pee (urine) clear or pale yellow.  Wash your hands often. If you cannot use soap and water, use hand sanitizer.  Make sure that all people in your home wash their hands well and often.  Rest at home while you get better.  Take over-the-counter and prescription medicines only as told by your doctor.  Watch your condition for any changes.  Take a warm bath to help with any burning or pain from having watery poop.  Keep all follow-up  visits as told by your doctor. This is important. Contact a doctor if:  You cannot keep fluids down.  Your symptoms get worse.  You have new symptoms.  You feel light-headed or dizzy.  You have muscle cramps. Get help right away if:  You have chest pain.  You feel very weak or you pass out (faint).  You see blood in your throw-up.  Your throw-up looks like coffee grounds.  You have bloody or black poop (stools) or poop that look like tar.  You have a very bad headache, a stiff neck, or both.  You have a rash.  You have very bad pain, cramping, or bloating in your belly (abdomen).  You have trouble breathing.  You are breathing very quickly.  Your heart is beating very quickly.  Your skin feels cold and clammy.  You feel confused.  You have pain when you pee.  You have signs of dehydration, such as: ? Dark pee, hardly any pee, or no pee. ? Cracked lips. ? Dry mouth. ? Sunken eyes. ? Sleepiness. ? Weakness. This information is not intended to replace advice given to you by your health care provider. Make sure you discuss any questions you have with your health care provider. Document Released: 07/16/2007 Document Revised: 08/17/2015 Document Reviewed: 10/03/2014 Elsevier Interactive Patient Education  2017 [employer]  Viral Gastroenteritis, Adult Viral gastroenteritis is also known as the stomach flu. This condition is caused by various viruses. These viruses can be passed from person to person  very easily (are very contagious). This condition may affect your stomach, small intestine, and large intestine. It can cause sudden watery diarrhea, fever, and vomiting. Diarrhea and vomiting can make you feel weak and cause you to become dehydrated. You may not be able to keep fluids down. Dehydration can make you tired and thirsty, cause you to have a dry mouth, and decrease how often you urinate. Older adults and people with other diseases or a weak immune system  are at higher risk for dehydration. It is important to replace the fluids that you lose from diarrhea and vomiting. If you become severely dehydrated, you may need to get fluids through an IV tube. What are the causes? Gastroenteritis is caused by various viruses, including rotavirus and norovirus. Norovirus is the most common cause in adults. You can get sick by eating food, drinking water, or touching a surface contaminated with one of these viruses. You can also get sick from sharing utensils or other personal items with an infected person. What increases the risk? This condition is more likely to develop in people:  Who have a weak defense system (immune system).  Who live with one or more children who are younger than 40 years old.  Who live in a nursing home.  Who go on cruise ships.  What are the signs or symptoms? Symptoms of this condition start suddenly 1-2 days after exposure to a virus. Symptoms may last a few days or as long as a week. The most common symptoms are watery diarrhea and vomiting. Other symptoms include:  Fever.  Headache.  Fatigue.  Pain in the abdomen.  Chills.  Weakness.  Nausea.  Muscle aches.  Loss of appetite.  How is this diagnosed? This condition is diagnosed with a medical history and physical exam. You may also have a stool test to check for viruses or other infections. How is this treated? This condition typically goes away on its own. The focus of treatment is to restore lost fluids (rehydration). Your health care provider may recommend that you take an oral rehydration solution (ORS) to replace important salts and minerals (electrolytes) in your body. Severe cases of this condition may require giving fluids through an IV tube. Treatment may also include medicine to help with your symptoms. Follow these instructions at home: Follow instructions from your health care provider about how to care for yourself at home. Eating and  drinking Follow these recommendations as told by your health care provider:  Take an ORS. This is a drink that is sold at pharmacies and retail stores.  Drink clear fluids in small amounts as you are able. Clear fluids include water, ice chips, diluted fruit juice, and low-calorie sports drinks.  Eat bland, easy-to-digest foods in small amounts as you are able. These foods include bananas, applesauce, rice, lean meats, toast, and crackers.  Avoid fluids that contain a lot of sugar or caffeine, such as energy drinks, sports drinks, and soda.  Avoid alcohol.  Avoid spicy or fatty foods.  General instructions   Drink enough fluid to keep your urine clear or pale yellow.  Wash your hands often. If soap and water are not available, use hand sanitizer.  Make sure that all people in your household wash their hands well and often.  Take over-the-counter and prescription medicines only as told by your health care provider.  Rest at home while you recover.  Watch your condition for any changes.  Take a warm bath to relieve any burning or pain  from frequent diarrhea episodes.  Keep all follow-up visits as told by your health care provider. This is important. Contact a health care provider if:  You cannot keep fluids down.  Your symptoms get worse.  You have new symptoms.  You feel light-headed or dizzy.  You have muscle cramps. Get help right away if:  You have chest pain.  You feel extremely weak or you faint.  You see blood in your vomit.  Your vomit looks like coffee grounds.  You have bloody or black stools or stools that look like tar.  You have a severe headache, a stiff neck, or both.  You have a rash.  You have severe pain, cramping, or bloating in your abdomen.  You have trouble breathing or you are breathing very quickly.  Your heart is beating very quickly.  Your skin feels cold and clammy.  You feel confused.  You have pain when you  urinate.  You have signs of dehydration, such as: ? Dark urine, very little urine, or no urine. ? Cracked lips. ? Dry mouth. ? Sunken eyes. ? Sleepiness. ? Weakness. This information is not intended to replace advice given to you by your health care provider. Make sure you discuss any questions you have with your health care provider. Document Released: 01/27/2005 Document Revised: 07/11/2015 Document Reviewed: 10/03/2014 Elsevier Interactive Patient Education  2018 [employer].  Thoracic Strain A thoracic strain, which is sometimes called a mid-back strain, is an injury to the muscles or tendons that attach to the upper part of your back behind your chest. This type of injury occurs when a muscle is overstretched or overloaded. Thoracic strains can range from mild to severe. Mild strains may involve stretching a muscle or tendon without tearing it. These injuries may heal in 1-2 weeks. More severe strains involve tearing of muscle fibers or tendons. These will cause more pain and may take 6-8 weeks to heal. What are the causes? This condition may be caused by:  An injury in which a sudden force is placed on the muscle.  Exercising without properly warming up.  Overuse of the muscle.  Improper form during certain movements.  Other injuries that surround or cause stress on the mid-back, causing a strain on the muscles.  In some cases, the cause may not be known. What increases the risk? This injury is more common in:  Athletes.  People with obesity.  What are the signs or symptoms? The main symptom of this condition is pain, especially with movement. Other symptoms include:  Bruising.  Swelling.  Spasm.  How is this diagnosed? This condition may be diagnosed with a physical exam. X-rays may be taken to check for a fracture. How is this treated? This condition may be treated with:  Resting and icing the injured area.  Physical therapy. This will involve doing  stretching and strengthening exercises.  Medicines for pain and inflammation.  Follow these instructions at home:  Rest as needed. Follow instructions from your health care provider about any restrictions on activity.  If directed, apply ice to the injured area: ? Put ice in a plastic bag. ? Place a towel between your skin and the bag. ? Leave the ice on for 20 minutes, 2-3 times per day.  Take over-the-counter and prescription medicines only as told by your health care provider.  Begin doing exercises as told by your health care provider or physical therapist.  Always warm up properly before physical activity or sports.  Bend your  knees before you lift heavy objects.  Keep all follow-up visits as told by your health care provider. This is important. Contact a health care provider if:  Your pain is not helped by medicine.  Your pain, bruising, or swelling is getting worse.  You have a fever. Get help right away if:  You have shortness of breath.  You have chest pain.  You develop numbness or weakness in your legs.  You have involuntary loss of urine (urinary incontinence). This information is not intended to replace advice given to you by your health care provider. Make sure you discuss any questions you have with your health care provider. Document Released: 04/19/2003 Document Revised: 09/29/2015 Document Reviewed: 03/23/2014 Elsevier Interactive Patient Education  [employer].

## 2017-04-16 NOTE — Progress Notes (Addendum)
Subjective:  Patient ID: Janet [name], female    DOB: 10-07-1975  Age: 42 y.o. MRN: [id number]  CC: vomiting, headache, back pain   HPI Janet [name] presents for evaluation of 2 problems.  She tells of a 2-week history of upper back pain without injury.  She denies heavy lifting or increased work of her shoulder.  She tried no medicines for this.  She requests an x-ray of her upper back.  She tells of a 1 day history of headache with nausea and vomiting.  There is also malaise.  There is been no abdominal pain cramping or diarrhea.  There is been no fever or rash.  There is been no neck pain or stiffness.  She has no history of headaches.  She had multiple exposures to the stomach flu.  She has finished her LMP.  She has no symptoms referrable to the urinary tract.   Outpatient Medications Prior to Visit  Medication Sig Dispense Refill  . methimazole (TAPAZOLE) 10 MG tablet TAKE 1 TABLET BY MOUTH DAILY. 90 tablet 3  . methimazole (TAPAZOLE) 10 MG tablet TAKE 1 TABLET BY MOUTH DAILY. 30 tablet 1   No facility-administered medications prior to visit.     ROS Review of Systems  Constitutional: Positive for appetite change. Negative for chills, fatigue, fever and unexpected weight change.  HENT: Negative.   Eyes: Negative for photophobia and visual disturbance.  Respiratory: Negative.   Cardiovascular: Negative.   Gastrointestinal: Positive for nausea and vomiting. Negative for abdominal pain, constipation and diarrhea.  Musculoskeletal: Positive for back pain and myalgias.  Skin: Negative for rash.  Allergic/Immunologic: Negative for immunocompromised state.  Neurological: Positive for weakness and headaches.  Hematological: Does not bruise/bleed easily.  Psychiatric/Behavioral: Negative.     Objective:  BP 110/80 (BP Location: Left Arm, Patient Position: Sitting, Cuff Size: Normal)   Pulse 84   Ht 5\' 6"  (1.676 m)   Wt 182 lb (82.6 kg)   SpO2 98%   BMI 29.38 kg/m    BP Readings from Last 3 Encounters:  04/16/17 110/80  12/23/16 102/62  12/05/16 110/78    Wt Readings from Last 3 Encounters:  04/16/17 182 lb (82.6 kg)  12/23/16 187 lb (84.8 kg)  12/05/16 187 lb (84.8 kg)    Physical Exam  Constitutional: She is oriented to person, place, and time. She appears well-developed and well-nourished. No distress.  HENT:  Head: Normocephalic and atraumatic.  Right Ear: External ear normal.  Left Ear: External ear normal.  Mouth/Throat: Oropharynx is clear and moist. No oropharyngeal exudate.  Eyes: Conjunctivae are normal. Pupils are equal, round, and reactive to light. Right eye exhibits no discharge. Left eye exhibits no discharge. No scleral icterus.  Neck: Neck supple. No JVD present. No tracheal deviation present. No thyromegaly present.  Cardiovascular: Normal rate, regular rhythm and normal heart sounds.  Pulmonary/Chest: Effort normal and breath sounds normal. No stridor.  Abdominal: Soft. Bowel sounds are normal. She exhibits no distension. There is no tenderness. There is no rebound and no CVA tenderness.  Musculoskeletal:       Right shoulder: She exhibits normal range of motion, no tenderness and no bony tenderness.       Left shoulder: She exhibits normal range of motion, no tenderness and no bony tenderness.       Cervical back: She exhibits normal range of motion, no tenderness, no bony tenderness, no swelling and no deformity.       Thoracic back: She exhibits  normal range of motion, no tenderness and no bony tenderness.  Lymphadenopathy:    She has no cervical adenopathy.  Neurological: She is alert and oriented to person, place, and time.  Skin: Skin is warm and dry. No rash noted. She is not diaphoretic.  Psychiatric: She has a normal mood and affect. Her behavior is normal.    Lab Results  Component Value Date   WBC 6.7 12/05/2016   HGB 13.2 12/05/2016   HCT 39.5 12/05/2016   PLT 384.0 12/05/2016   GLUCOSE 83 12/05/2016    CHOL 143 12/05/2016   TRIG 64.0 12/05/2016   HDL 59.60 12/05/2016   LDLCALC 71 12/05/2016   ALT 14 12/05/2016   AST 12 12/05/2016   NA 138 12/05/2016   K 3.9 12/05/2016   CL 103 12/05/2016   CREATININE 0.69 12/05/2016   BUN 11 12/05/2016   CO2 28 12/05/2016   TSH 2.26 12/05/2016   HGBA1C 5.4 11/30/2015    Ct Abdomen Pelvis W Contrast  Result Date: 10/31/2016 CLINICAL DATA:  Generalized abdominal pain, nausea, and constipation for 2 months. Elevated lipase level. Clinical suspicion for appendicitis. EXAM: CT ABDOMEN AND PELVIS WITH CONTRAST TECHNIQUE: Multidetector CT imaging of the abdomen and pelvis was performed using the standard protocol following bolus administration of intravenous contrast. CONTRAST:  100 mL Isovue-300 COMPARISON:  None. FINDINGS: Lower Chest: No acute findings. Hepatobiliary: No hepatic masses identified. Gallbladder is unremarkable. Pancreas:  No mass or inflammatory changes. Spleen: Within normal limits in size and appearance. Adrenals/Urinary Tract: No masses identified. No evidence of hydronephrosis. Stomach/Bowel: No evidence of obstruction, inflammatory process or abnormal fluid collections. Normal appendix visualized. Vascular/Lymphatic: No pathologically enlarged lymph nodes. No abdominal aortic aneurysm. Reproductive: Tiny less than 1 cm fibroid seen in the uterine fundus. Adnexal regions are unremarkable. Other:  None. Musculoskeletal:  No suspicious bone lesions identified. IMPRESSION: No evidence of appendicitis or other acute findings. Tiny less than 1 cm uterine fibroid. Electronically Signed   By: Myles Rosenthal M.D.   On: 10/31/2016 16:02    Assessment & Plan:   [name] was seen today for vomiting, headache, back pain.  Diagnoses and all orders for this visit:  Strain of trapezius muscle, unspecified laterality, initial encounter -     DG Thoracic Spine W/Swimmers; Future -     DG Thoracic Spine W/Swimmers -     methocarbamol (ROBAXIN) 500 MG tablet;  Take 1 tablet (500 mg total) by mouth every 8 (eight) hours as needed for muscle spasms.  Gastroenteritis -     CBC -     Urinalysis, Routine w reflex microscopic -     promethazine (PHENERGAN) 25 MG tablet; Take 1 tablet (25 mg total) by mouth every 6 (six) hours as needed for nausea or vomiting.   I am having Jacoby A. Pinkard start on promethazine and methocarbamol. I am also having her maintain her methimazole.  Meds ordered this encounter  Medications  . promethazine (PHENERGAN) 25 MG tablet    Sig: Take 1 tablet (25 mg total) by mouth every 6 (six) hours as needed for nausea or vomiting.    Dispense:  30 tablet    Refill:  0  . methocarbamol (ROBAXIN) 500 MG tablet    Sig: Take 1 tablet (500 mg total) by mouth every 8 (eight) hours as needed for muscle spasms.    Dispense:  30 tablet    Refill:  0  Encouraged patient to rest and drink plenty of clear fluids.  She  is familiar with the brat diet.  Gave her exercises and some Robaxin to try for her upper back strain.  She will follow-up with her primary care doctor if she does not improve.  Otherwise I would like to see her back the first of the week of her gastroenteritis symptoms do not improve on their own.   Follow-up: Return in about 4 days (around 04/20/2017), or if symptoms worsen or fail to improve.  Mliss Sax, MD

## 2017-04-17 LAB — URINALYSIS, ROUTINE W REFLEX MICROSCOPIC
Bilirubin Urine: NEGATIVE
Ketones, ur: NEGATIVE
LEUKOCYTES UA: NEGATIVE
Nitrite: NEGATIVE
SPECIFIC GRAVITY, URINE: 1.025 (ref 1.000–1.030)
Total Protein, Urine: NEGATIVE
URINE GLUCOSE: NEGATIVE
Urobilinogen, UA: 0.2 (ref 0.0–1.0)
pH: 7 (ref 5.0–8.0)

## 2017-04-17 LAB — CBC
HCT: 37.8 % (ref 36.0–46.0)
HEMOGLOBIN: 12.6 g/dL (ref 12.0–15.0)
MCHC: 33.3 g/dL (ref 30.0–36.0)
MCV: 85.7 fl (ref 78.0–100.0)
Platelets: 370 10*3/uL (ref 150.0–400.0)
RBC: 4.41 Mil/uL (ref 3.87–5.11)
RDW: 14.5 % (ref 11.5–15.5)
WBC: 6 10*3/uL (ref 4.0–10.5)

## 2017-04-17 MED FILL — methIMAzole 10 MG TABS: 10 | 90 days supply | Qty: 90 | Fill #1

## 2017-05-12 ENCOUNTER — Ambulatory Visit: Payer: 59 | Admitting: Family Medicine

## 2017-05-12 ENCOUNTER — Encounter: Payer: Self-pay | Admitting: Family Medicine

## 2017-05-12 VITALS — BP 106/74 | HR 81 | Temp 97.8°F | Ht 65.5 in | Wt 186.0 lb

## 2017-05-12 DIAGNOSIS — Z7689 Persons encountering health services in other specified circumstances: Secondary | ICD-10-CM | POA: Diagnosis not present

## 2017-05-12 DIAGNOSIS — E0590 Thyrotoxicosis, unspecified without thyrotoxic crisis or storm: Secondary | ICD-10-CM

## 2017-05-12 NOTE — Progress Notes (Signed)
Patient presents to clinic today to establish care.  SUBJECTIVE: PMH: Patient is a [age] year old female with past medical history significant for hyperthyroidism.  Pt was previously seen at [facility] by Alysia Pennaharlotte Nche, NP .  Pt is followed by endocrinology, Dr. Revonda StandardAllison.  Patient's last CPE was 12/05/16.  Hyperthyroidism: -Patient is on methimazole 10 mg daily -Last TSH was 2.26 on 12/05/2016 -Pt endorses weight gain.  Allergies: Sulfa-throat swelling  Past surgical history: C-section x3 in 2002, 2011, 2008  Social history: Patient is married.  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  Patient has 3 children (2 boys and one girl) 2816, 11, and 7.  Patient denies alcohol, tobacco, drug use.  Family medical history:  Mom-alive, MI, HLD, HTN Dad-deceased Patient has 2 brothers who are alive and well  Health Maintenance: Dental --Dr. Darl PikesAnbec DeShield Immunizations --influenza vaccine 2018   Past Medical History:  Diagnosis Date  . HYPERSOMNIA 04/10/2010  . HYPERTHYROIDISM 08/03/2006    Past Surgical History:  Procedure Laterality Date  . CESAREAN SECTION     x3  . DILATION AND CURETTAGE OF UTERUS    . TUBAL LIGATION      Current Outpatient Medications on File Prior to Visit  Medication Sig Dispense Refill  . methimazole (TAPAZOLE) 10 MG tablet TAKE 1 TABLET BY MOUTH DAILY. 90 tablet 3   No current facility-administered medications on file prior to visit.     Allergies  Allergen Reactions  . Sulfa Antibiotics Shortness Of Breath and Swelling    Family History  Problem Relation Age of Onset  . Hypertension Mother   . Hyperlipidemia Mother   . Sleep apnea Mother   . Thyroid disease Mother   . Stroke Mother   . Cancer Other        Aunt with Cancer- unknown type  . Lupus Other        Uncle  . Cancer Other        Lung Cancer-Uncle    Social History   Socioeconomic History  . Marital status: Married    Spouse name:  Not on file  . Number of children: 3  . Years of education: Not on file  . Highest education level: Not on file  Occupational History  . Not on file  Social Needs  . Financial resource strain: Not on file  . Food insecurity:    Worry: Not on file    Inability: Not on file  . Transportation needs:    Medical: Not on file    Non-medical: Not on file  Tobacco Use  . Smoking status: Never Smoker  . Smokeless tobacco: Never Used  Substance and Sexual Activity  . Alcohol use: No    Alcohol/week: 0.0 oz  . Drug use: No  . Sexual activity: Yes    Partners: Male    Birth control/protection: Surgical    Comment: Tubal  Lifestyle  . Physical activity:    Days per week: Not on file    Minutes per session: Not on file  . Stress: Not on file  Relationships  . Social connections:    Talks on phone: Not on file    Gets together: Not on file    Attends religious service: Not on file    Active member of club or organization: Not on file    Attends meetings of clubs or organizations: Not on file    Relationship status: Not on file  . Intimate partner violence:  Fear of current or ex partner: Not on file    Emotionally abused: Not on file    Physically abused: Not on file    Forced sexual activity: Not on file  Other Topics Concern  . Not on file  Social History Narrative   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    ROS General: Denies fever, chills, night sweats, changes in appetite  +weight gain HEENT: Denies headaches, ear pain, changes in vision, rhinorrhea, sore throat CV: Denies CP, palpitations, SOB, orthopnea Pulm: Denies SOB, cough, wheezing GI: Denies abdominal pain, nausea, vomiting, diarrhea, constipation GU: Denies dysuria, hematuria, frequency, vaginal discharge Msk: Denies muscle cramps, joint pains Neuro: Denies weakness, numbness, tingling Skin: Denies rashes, bruising Psych: Denies depression, anxiety, hallucinations  BP 106/74 (BP Location: Left Arm, Patient Position: Sitting,  Cuff Size: Normal)   Pulse 81   Temp 97.8 F (36.6 C) (Oral)   Ht 5' 5.5" (1.664 m)   Wt 186 lb (84.4 kg)   LMP 04/14/2017 (Approximate)   SpO2 98%   BMI 30.48 kg/m   Physical Exam Gen. Pleasant, well developed, well-nourished, in NAD HEENT - [place]/AT, PERRL, no scleral icterus, no nasal drainage, pharynx without erythema or exudate.  TMs normal bilaterally.  No cervical lymphadenopathy. Neck: No JVD, no thyromegaly, no carotid bruits Lungs: no use of accessory muscles, CTAB, no wheezes, rales or rhonchi Cardiovascular: RRR, No r/g/m, no peripheral edema Abdomen: BS present, soft, nontender,nondistended, no hepatosplenomegaly Musculoskeletal: No deformities, moves all four extremities, no cyanosis or clubbing, normal tone Neuro:  A&Ox3, CN II-XII intact, normal gait Skin:  Warm, dry, intact, no lesions Psych: normal affect, mood appropriate  Recent Results (from the past 2160 hour(s))  CBC     Status: None   Collection Time: 04/16/17  4:04 PM  Result Value Ref Range   WBC 6.0 4.0 - 10.5 K/uL   RBC 4.41 3.87 - 5.11 Mil/uL   Platelets 370.0 150.0 - 400.0 K/uL   Hemoglobin 12.6 12.0 - 15.0 g/dL   HCT 40.9 81.1 - 91.4 %   MCV 85.7 78.0 - 100.0 fl   MCHC 33.3 30.0 - 36.0 g/dL   RDW 78.2 95.6 - 21.3 %  Urinalysis, Routine w reflex microscopic     Status: Abnormal   Collection Time: 04/16/17  4:04 PM  Result Value Ref Range   Color, Urine YELLOW Yellow;Lt. Yellow   APPearance CLEAR Clear   Specific Gravity, Urine 1.025 1.000 - 1.030   pH 7.0 5.0 - 8.0   Total Protein, Urine NEGATIVE Negative   Urine Glucose NEGATIVE Negative   Ketones, ur NEGATIVE Negative   Bilirubin Urine NEGATIVE Negative   Hgb urine dipstick LARGE (A) Negative   Urobilinogen, UA 0.2 0.0 - 1.0   Leukocytes, UA NEGATIVE Negative   Nitrite NEGATIVE Negative   WBC, UA 0-2/hpf 0-2/hpf   RBC / HPF 3-6/hpf (A) 0-2/hpf   Mucus, UA Presence of (A) None   Squamous Epithelial / LPF Rare(0-4/hpf) Rare(0-4/hpf)    Bacteria, UA Rare(<10/hpf) (A) None    Assessment/Plan: Hyperthyroidism -TSH was 2.6 on 12/05/16 -Continue methimazole 10 mg daily -continue follow-up with endocrinology, Dr. Revonda Standard  Encounter to establish care -We reviewed the PMH, PSH, FH, SH, Meds and Allergies. -We provided refills for any medications we will prescribe as needed. -We addressed current concerns per orders and patient instructions. -We have asked for records for pertinent exams, studies, vaccines and notes from previous providers. -We have advised patient to follow up per instructions below.  Follow-up PRN  Grier Mitts, MD

## 2017-05-12 NOTE — Patient Instructions (Signed)
Health Maintenance, Female Adopting a healthy lifestyle and getting preventive care can go a long way to promote health and wellness. Talk with your health care provider about what schedule of regular examinations is right for you. This is a good chance for you to check in with your provider about disease prevention and staying healthy. In between checkups, there are plenty of things you can do on your own. Experts have done a lot of research about which lifestyle changes and preventive measures are most likely to keep you healthy. Ask your health care provider for more information. Weight and diet Eat a healthy diet  Be sure to include plenty of vegetables, fruits, low-fat dairy products, and lean protein.  Do not eat a lot of foods high in solid fats, added sugars, or salt.  Get regular exercise. This is one of the most important things you can do for your health. ? Most adults should exercise for at least 150 minutes each week. The exercise should increase your heart rate and make you sweat (moderate-intensity exercise). ? Most adults should also do strengthening exercises at least twice a week. This is in addition to the moderate-intensity exercise.  Maintain a healthy weight  Body mass index (BMI) is a measurement that can be used to identify possible weight problems. It estimates body fat based on height and weight. Your health care provider can help determine your BMI and help you achieve or maintain a healthy weight.  For females 20 years of age and older: ? A BMI below 18.5 is considered underweight. ? A BMI of 18.5 to 24.9 is normal. ? A BMI of 25 to 29.9 is considered overweight. ? A BMI of 30 and above is considered obese.  Watch levels of cholesterol and blood lipids  You should start having your blood tested for lipids and cholesterol at 40 years of age, then have this test every 5 years.  You may need to have your cholesterol levels checked more often if: ? Your lipid or  cholesterol levels are high. ? You are older than 40 years of age. ? You are at high risk for heart disease.  Cancer screening Lung Cancer  Lung cancer screening is recommended for adults 55-80 years old who are at high risk for lung cancer because of a history of smoking.  A yearly low-dose CT scan of the lungs is recommended for people who: ? Currently smoke. ? Have quit within the past 15 years. ? Have at least a 30-pack-year history of smoking. A pack year is smoking an average of one pack of cigarettes a day for 1 year.  Yearly screening should continue until it has been 15 years since you quit.  Yearly screening should stop if you develop a health problem that would prevent you from having lung cancer treatment.  Breast Cancer  Practice breast self-awareness. This means understanding how your breasts normally appear and feel.  It also means doing regular breast self-exams. Let your health care provider know about any changes, no matter how small.  If you are in your 20s or 30s, you should have a clinical breast exam (CBE) by a health care provider every 1-3 years as part of a regular health exam.  If you are 40 or older, have a CBE every year. Also consider having a breast X-ray (mammogram) every year.  If you have a family history of breast cancer, talk to your health care provider about genetic screening.  If you are at high risk   for breast cancer, talk to your health care provider about having an MRI and a mammogram every year.  Breast cancer gene (BRCA) assessment is recommended for women who have family members with BRCA-related cancers. BRCA-related cancers include: ? Breast. ? Ovarian. ? Tubal. ? Peritoneal cancers.  Results of the assessment will determine the need for genetic counseling and BRCA1 and BRCA2 testing.  Cervical Cancer Your health care provider may recommend that you be screened regularly for cancer of the pelvic organs (ovaries, uterus, and  vagina). This screening involves a pelvic examination, including checking for microscopic changes to the surface of your cervix (Pap test). You may be encouraged to have this screening done every 3 years, beginning at age 22.  For women ages 56-65, health care providers may recommend pelvic exams and Pap testing every 3 years, or they may recommend the Pap and pelvic exam, combined with testing for human papilloma virus (HPV), every 5 years. Some types of HPV increase your risk of cervical cancer. Testing for HPV may also be done on women of any age with unclear Pap test results.  Other health care providers may not recommend any screening for nonpregnant women who are considered low risk for pelvic cancer and who do not have symptoms. Ask your health care provider if a screening pelvic exam is right for you.  If you have had past treatment for cervical cancer or a condition that could lead to cancer, you need Pap tests and screening for cancer for at least 20 years after your treatment. If Pap tests have been discontinued, your risk factors (such as having a new sexual partner) need to be reassessed to determine if screening should resume. Some women have medical problems that increase the chance of getting cervical cancer. In these cases, your health care provider may recommend more frequent screening and Pap tests.  Colorectal Cancer  This type of cancer can be detected and often prevented.  Routine colorectal cancer screening usually begins at 40 years of age and continues through 40 years of age.  Your health care provider may recommend screening at an earlier age if you have risk factors for colon cancer.  Your health care provider may also recommend using home test kits to check for hidden blood in the stool.  A small camera at the end of a tube can be used to examine your colon directly (sigmoidoscopy or colonoscopy). This is done to check for the earliest forms of colorectal  cancer.  Routine screening usually begins at age 33.  Direct examination of the colon should be repeated every 5-10 years through 40 years of age. However, you may need to be screened more often if early forms of precancerous polyps or small growths are found.  Skin Cancer  Check your skin from head to toe regularly.  Tell your health care provider about any new moles or changes in moles, especially if there is a change in a mole's shape or color.  Also tell your health care provider if you have a mole that is larger than the size of a pencil eraser.  Always use sunscreen. Apply sunscreen liberally and repeatedly throughout the day.  Protect yourself by wearing long sleeves, pants, a wide-brimmed hat, and sunglasses whenever you are outside.  Heart disease, diabetes, and high blood pressure  High blood pressure causes heart disease and increases the risk of stroke. High blood pressure is more likely to develop in: ? People who have blood pressure in the high end of  the normal range (130-139/85-89 mm Hg). ? People who are overweight or obese. ? People who are African American.  If you are 21-29 years of age, have your blood pressure checked every 3-5 years. If you are 3 years of age or older, have your blood pressure checked every year. You should have your blood pressure measured twice-once when you are at a hospital or clinic, and once when you are not at a hospital or clinic. Record the average of the two measurements. To check your blood pressure when you are not at a hospital or clinic, you can use: ? An automated blood pressure machine at a pharmacy. ? A home blood pressure monitor.  If you are between 17 years and 37 years old, ask your health care provider if you should take aspirin to prevent strokes.  Have regular diabetes screenings. This involves taking a blood sample to check your fasting blood sugar level. ? If you are at a normal weight and have a low risk for diabetes,  have this test once every three years after 40 years of age. ? If you are overweight and have a high risk for diabetes, consider being tested at a younger age or more often. Preventing infection Hepatitis B  If you have a higher risk for hepatitis B, you should be screened for this virus. You are considered at high risk for hepatitis B if: ? You were born in a country where hepatitis B is common. Ask your health care provider which countries are considered high risk. ? Your parents were born in a high-risk country, and you have not been immunized against hepatitis B (hepatitis B vaccine). ? You have HIV or AIDS. ? You use needles to inject street drugs. ? You live with someone who has hepatitis B. ? You have had sex with someone who has hepatitis B. ? You get hemodialysis treatment. ? You take certain medicines for conditions, including cancer, organ transplantation, and autoimmune conditions.  Hepatitis C  Blood testing is recommended for: ? Everyone born from [birth year] through 1963. ? Anyone with known risk factors for hepatitis C.  Sexually transmitted infections (STIs)  You should be screened for sexually transmitted infections (STIs) including gonorrhea and chlamydia if: ? You are sexually active and are younger than 40 years of age. ? You are older than 40 years of age and your health care provider tells you that you are at risk for this type of infection. ? Your sexual activity has changed since you were last screened and you are at an increased risk for chlamydia or gonorrhea. Ask your health care provider if you are at risk.  If you do not have HIV, but are at risk, it may be recommended that you take a prescription medicine daily to prevent HIV infection. This is called pre-exposure prophylaxis (PrEP). You are considered at risk if: ? You are sexually active and do not regularly use condoms or know the HIV status of your partner(s). ? You take drugs by injection. ? You are  sexually active with a partner who has HIV.  Talk with your health care provider about whether you are at high risk of being infected with HIV. If you choose to begin PrEP, you should first be tested for HIV. You should then be tested every 3 months for as long as you are taking PrEP. Pregnancy  If you are premenopausal and you may become pregnant, ask your health care provider about preconception counseling.  If you may become  pregnant, take 400 to 800 micrograms (mcg) of folic acid every day.  If you want to prevent pregnancy, talk to your health care provider about birth control (contraception). Osteoporosis and menopause  Osteoporosis is a disease in which the bones lose minerals and strength with aging. This can result in serious bone fractures. Your risk for osteoporosis can be identified using a bone density scan.  If you are 82 years of age or older, or if you are at risk for osteoporosis and fractures, ask your health care provider if you should be screened.  Ask your health care provider whether you should take a calcium or vitamin D supplement to lower your risk for osteoporosis.  Menopause may have certain physical symptoms and risks.  Hormone replacement therapy may reduce some of these symptoms and risks. Talk to your health care provider about whether hormone replacement therapy is right for you. Follow these instructions at home:  Schedule regular health, dental, and eye exams.  Stay current with your immunizations.  Do not use any tobacco products including cigarettes, chewing tobacco, or electronic cigarettes.  If you are pregnant, do not drink alcohol.  If you are breastfeeding, limit how much and how often you drink alcohol.  Limit alcohol intake to no more than 1 drink per day for nonpregnant women. One drink equals 12 ounces of beer, 5 ounces of wine, or 1 ounces of hard liquor.  Do not use street drugs.  Do not share needles.  Ask your health care  provider for help if you need support or information about quitting drugs.  Tell your health care provider if you often feel depressed.  Tell your health care provider if you have ever been abused or do not feel safe at home. This information is not intended to replace advice given to you by your health care provider. Make sure you discuss any questions you have with your health care provider. Document Released: 08/12/2010 Document Revised: 07/05/2015 Document Reviewed: 10/31/2014 Elsevier Interactive Patient Education  2018 [employer].  Hyperthyroidism Hyperthyroidism is when the thyroid is too active (overactive). Your thyroid is a large gland that is located in your neck. The thyroid helps to control how your body uses food (metabolism). When your thyroid is overactive, it produces too much of a hormone called thyroxine. What are the causes? Causes of hyperthyroidism may include:  Graves disease. This is when your immune system attacks the thyroid gland. This is the most common cause.  Inflammation of the thyroid gland.  Tumor in the thyroid gland or somewhere else.  Excessive use of thyroid medicines, including: ? Prescription thyroid supplement. ? Herbal supplements that mimic thyroid hormones.  Solid or fluid-filled lumps within your thyroid gland (thyroid nodules).  Excessive ingestion of iodine.  What increases the risk?  Being female.  Having a family history of thyroid conditions. What are the signs or symptoms? Signs and symptoms of hyperthyroidism may include:  Nervousness.  Inability to tolerate heat.  Unexplained weight loss.  Diarrhea.  Change in the texture of hair or skin.  Heart skipping beats or making extra beats.  Rapid heart rate.  Loss of menstruation.  Shaky hands.  Fatigue.  Restlessness.  Increased appetite.  Sleep problems.  Enlarged thyroid gland or nodules.  How is this diagnosed? Diagnosis of hyperthyroidism may  include:  Medical history and physical exam.  Blood tests.  Ultrasound tests.  How is this treated? Treatment may include:  Medicines to control your thyroid.  Surgery to remove your  thyroid.  Radiation therapy.  Follow these instructions at home:  Take medicines only as directed by your health care provider.  Do not use any tobacco products, including cigarettes, chewing tobacco, or electronic cigarettes. If you need help quitting, ask your health care provider.  Do not exercise or do physical activity until your health care provider approves.  Keep all follow-up appointments as directed by your health care provider. This is important. Contact a health care provider if:  Your symptoms do not get better with treatment.  You have fever.  You are taking thyroid replacement medicine and you: ? Have depression. ? Feel mentally and physically slow. ? Have weight gain. Get help right away if:  You have decreased alertness or a change in your awareness.  You have abdominal pain.  You feel dizzy.  You have a rapid heartbeat.  You have an irregular heartbeat. This information is not intended to replace advice given to you by your health care provider. Make sure you discuss any questions you have with your health care provider. Document Released: 01/27/2005 Document Revised: 06/28/2015 Document Reviewed: 06/14/2013 Elsevier Interactive Patient Education  2018 [employer].

## 2017-06-12 ENCOUNTER — Ambulatory Visit: Payer: 59 | Admitting: Endocrinology

## 2017-06-19 ENCOUNTER — Ambulatory Visit: Payer: 59 | Admitting: Endocrinology

## 2017-06-29 ENCOUNTER — Telehealth: Payer: 59 | Admitting: Nurse Practitioner

## 2017-06-29 DIAGNOSIS — J0101 Acute recurrent maxillary sinusitis: Secondary | ICD-10-CM | POA: Diagnosis not present

## 2017-06-29 MED ORDER — AMOXICILLIN-POT CLAVULANATE 875-125 MG PO TABS: 1.0000 | ORAL_TABLET | Freq: Two times a day (BID) | ORAL | 0 refills | Status: DC

## 2017-06-29 MED FILL — AMOX-CLAV 875-125 MG TABLET: 875-125 | 7 days supply | Qty: 14 | Fill #0

## 2017-06-29 NOTE — Progress Notes (Signed)

## 2017-06-30 ENCOUNTER — Encounter: Payer: Self-pay | Admitting: Family Medicine

## 2017-06-30 MED ORDER — AMOXICILLIN-POT CLAVULANATE 875-125 MG PO TABS: 1.0000 | ORAL_TABLET | Freq: Two times a day (BID) | ORAL | 0 refills | Status: DC

## 2017-06-30 NOTE — Progress Notes (Signed)
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

## 2017-07-07 ENCOUNTER — Ambulatory Visit (INDEPENDENT_AMBULATORY_CARE_PROVIDER_SITE_OTHER): Payer: Self-pay | Admitting: Family Medicine

## 2017-07-07 VITALS — BP 110/70 | HR 96 | Temp 98.3°F | Resp 16 | Wt 187.6 lb

## 2017-07-07 DIAGNOSIS — B3731 Acute candidiasis of vulva and vagina: Secondary | ICD-10-CM

## 2017-07-07 DIAGNOSIS — B373 Candidiasis of vulva and vagina: Secondary | ICD-10-CM

## 2017-07-07 DIAGNOSIS — H6690 Otitis media, unspecified, unspecified ear: Secondary | ICD-10-CM

## 2017-07-07 MED ORDER — CEFDINIR 300 MG PO CAPS: 600.0000 mg | ORAL_CAPSULE | Freq: Every day | ORAL | 0 refills | Status: AC

## 2017-07-07 MED ORDER — FLUCONAZOLE 150 MG PO TABS: 150.0000 mg | ORAL_TABLET | Freq: Once | ORAL | 0 refills | Status: AC

## 2017-07-07 NOTE — Patient Instructions (Signed)
Otitis Media, Adult Otitis media is redness, soreness, and puffiness (swelling) in the space just behind your eardrum (middle ear). It may be caused by allergies or infection. It often happens along with a cold. Follow these instructions at home:  Take your medicine as told. Finish it even if you start to feel better.  Only take over-the-counter or prescription medicines for pain, discomfort, or fever as told by your doctor.  Follow up with your doctor as told. Contact a doctor if:  You have otitis media only in one ear, or bleeding from your nose, or both.  You notice a lump on your neck.  You are not getting better in 3-5 days.  You feel worse instead of better. Get help right away if:  You have pain that is not helped with medicine.  You have puffiness, redness, or pain around your ear.  You get a stiff neck.  You cannot move part of your face (paralysis).  You notice that the bone behind your ear hurts when you touch it. This information is not intended to replace advice given to you by your health care provider. Make sure you discuss any questions you have with your health care provider. Document Released: 07/16/2007 Document Revised: 07/05/2015 Document Reviewed: 08/24/2012 Elsevier Interactive Patient Education  2017 [employer]  

## 2017-07-07 NOTE — Progress Notes (Signed)
Janet [name] is a 42 y.o. female who presents today with concerns of left ear pain. She was treated revisit for sinusitis on 06/30/2017 with antibiotic and she reports experiencing mild improvement of symptoms but then recently experiencing ear fullness that is persistent and not relieve with motrin. She reports the first antibiotic has also caused her some vaginal irritation.  Review of Systems  Constitutional: Negative for chills, fever and malaise/fatigue.  HENT: Positive for ear pain. Negative for congestion, ear discharge, sinus pain and sore throat.   Eyes: Negative.   Respiratory: Negative for cough, sputum production and shortness of breath.   Cardiovascular: Negative.  Negative for chest pain.  Gastrointestinal: Negative for abdominal pain, diarrhea, nausea and vomiting.  Genitourinary: Negative for dysuria, frequency, hematuria and urgency.  Musculoskeletal: Negative for myalgias.  Skin: Negative.   Neurological: Negative for headaches.  Endo/Heme/Allergies: Negative.   Psychiatric/Behavioral: Negative.     O: Vitals:   07/07/17 1816  BP: 110/70  Pulse: 96  Resp: 16  Temp: 98.3 F (36.8 C)  SpO2: 96%     Physical Exam  Constitutional: She is oriented to person, place, and time. Vital signs are normal. She appears well-developed and well-nourished. She is active.  Non-toxic appearance. She does not have a sickly appearance.  HENT:  Head: Normocephalic.  Right Ear: Hearing, tympanic membrane, external ear and ear canal normal.  Left Ear: Hearing, external ear and ear canal normal. There is tenderness. Tympanic membrane is injected and erythematous. A middle ear effusion is present.  Nose: Nose normal.  Mouth/Throat: Uvula is midline and oropharynx is clear and moist.  Neck: Normal range of motion. Neck supple.  Cardiovascular: Normal rate, regular rhythm, normal heart sounds and normal pulses.  Pulmonary/Chest: Effort normal and breath sounds normal.  Abdominal:  Soft. Bowel sounds are normal.  Musculoskeletal: Normal range of motion.  Lymphadenopathy:       Head (right side): No submental and no submandibular adenopathy present.       Head (left side): No submental and no submandibular adenopathy present.    She has no cervical adenopathy.  Neurological: She is alert and oriented to person, place, and time.  Psychiatric: She has a normal mood and affect.  Vitals reviewed.  A: 1. Acute otitis media, unspecified otitis media type   2. Candidiasis of vagina    P:  Will change antibiotic because expected response at the 7 day mark of antibiotic use is not met- ear is markedly erythema and patient has evidence symptoms of pain and fullness. Prescribed second line antibiotic per up to date for treatment failure.  Exam findings, diagnosis etiology and medication use and indications reviewed with patient. Follow- Up and discharge instructions provided. No emergent/urgent issues found on exam.  Patient verbalized understanding of information provided and agrees with plan of care (POC), all questions answered.  1. Acute otitis media, unspecified otitis media type - cefdinir (OMNICEF) 300 MG capsule; Take 2 capsules (600 mg total) by mouth daily for 10 days.  2. Candidiasis of vagina - fluconazole (DIFLUCAN) 150 MG tablet; Take 1 tablet (150 mg total) by mouth once for 1 dose.

## 2017-07-17 ENCOUNTER — Ambulatory Visit: Payer: 59 | Admitting: Endocrinology

## 2017-07-31 ENCOUNTER — Ambulatory Visit (INDEPENDENT_AMBULATORY_CARE_PROVIDER_SITE_OTHER): Payer: Self-pay | Admitting: Medical

## 2017-07-31 DIAGNOSIS — Z111 Encounter for screening for respiratory tuberculosis: Secondary | ICD-10-CM

## 2017-07-31 NOTE — Progress Notes (Signed)
Patient presents for PPD placement for school Denies previous positive TB test  Denies known exposure to TB   Tuberculin skin test applied to left ventral forearm.  Patient informed to return to [facility] in 48-72 hours for PPD read. Vaccine Information Statement provided to patient.    

## 2017-08-02 LAB — TB SKIN TEST
Induration: 0 mm
TB SKIN TEST: NEGATIVE

## 2017-08-05 ENCOUNTER — Telehealth: Payer: 59 | Admitting: Family

## 2017-08-05 DIAGNOSIS — R8290 Unspecified abnormal findings in urine: Secondary | ICD-10-CM

## 2017-08-05 NOTE — Progress Notes (Signed)
We are sorry that you are not feeling well.  Here is how we plan to help!  Based on what you shared with me it looks like you most likely have a change in your urine. I do not believe this is a UTI, however, if your symptoms become worse please let us know. You have been treated with two different antibiotics in the last month. I would suggest to start a probiotic or eat a daily yogurt and force fluids.   Urinary tract infections can be prevented by drinking plenty of water to keep your body hydrated.  Also be sure when you wipe, wipe from front to back and don't hold it in!  If possible, empty your bladder every 4 hours.  Your e-visit answers were reviewed by a board certified advanced clinical practitioner to complete your personal care plan.  Depending on the condition, your plan could have included both over the counter or prescription medications.  If there is a problem please reply  once you have received a response from your provider.  Your safety is important to us.  If you have drug allergies check your prescription carefully.    You can use MyChart to ask questions about today's visit, request a non-urgent call back, or ask for a work or school excuse for 24 hours related to this e-Visit. If it has been greater than 24 hours you will need to follow up with your provider, or enter a new e-Visit to address those concerns.   You will get an e-mail in the next two days asking about your experience.  I hope that your e-visit has been valuable and will speed your recovery. Thank you for using e-visits.

## 2017-08-08 ENCOUNTER — Telehealth: Payer: 59 | Admitting: Nurse Practitioner

## 2017-08-08 DIAGNOSIS — N3001 Acute cystitis with hematuria: Secondary | ICD-10-CM

## 2017-08-08 MED ORDER — CIPROFLOXACIN HCL 500 MG PO TABS: 500.0000 mg | ORAL_TABLET | Freq: Two times a day (BID) | ORAL | 0 refills | Status: DC

## 2017-08-08 NOTE — Progress Notes (Signed)

## 2017-09-04 MED FILL — methIMAzole 10 MG TABS: 10 | 90 days supply | Qty: 90 | Fill #2

## 2017-10-20 ENCOUNTER — Ambulatory Visit: Payer: Self-pay | Admitting: *Deleted

## 2017-10-20 ENCOUNTER — Ambulatory Visit: Payer: 59 | Admitting: Family Medicine

## 2017-10-20 ENCOUNTER — Encounter: Payer: Self-pay | Admitting: Family Medicine

## 2017-10-20 VITALS — BP 110/74 | HR 82 | Temp 98.2°F | Wt 185.4 lb

## 2017-10-20 DIAGNOSIS — R0789 Other chest pain: Secondary | ICD-10-CM | POA: Insufficient documentation

## 2017-10-20 NOTE — Progress Notes (Signed)
Janet [name] is a 42 year old female who comes in today accompanied by her husband for evaluation of chest pain  She states she was at a restaurant last night she had just eaten her salad was waiting for the food to arrive and she noticed some soreness in the left side of her chest. She describes as a dull ache. It hurts when she moves. No difficulty swallowing. She has had food get stuck in esophagus in the past but she says this is not that. She has no cardiac or pulmonary symptoms.  No history of trauma  BP 110/74 (BP Location: Right Arm, Patient Position: Sitting, Cuff Size: Normal)   Pulse 82   Temp 98.2 F (36.8 C) (Oral)   Wt 185 lb 6 oz (84.1 kg)   SpO2 97%   BMI 30.38 kg/m  Well-developed well-nourished female no acute distress vital signs stable she's afebrile cardiopulmonary exam normal except for some palpable tenderness left anterior chest wall eighth and ninth ribs at the sternum.  Impression chest wall pain...............Marland Kitchen plan reassured........Marland Kitchen Motrin 40 mg twice a day with food  When we were finished she requested a cholesterol check. I reviewed her medical records she had a completely normal lipid panel 10 months ago. Explained that since her lipids are normal the did not need to be checked yearly

## 2017-10-20 NOTE — Patient Instructions (Signed)
Motrin 400 mg twice daily with food for the chest wall pain  Your cholesterol was checked year ago and it was normal therefore does not need to be repeated

## 2017-10-20 NOTE — Telephone Encounter (Signed)
Pt called with having chest discomfort in her mid chest only when she moves or turns from side to side. She denies any cardiac symptoms, no headache, nausea, vomiting, sweating, no difficulty breathing, dizziness or weakness. Does not feel like heart burn or reflux. The discomfort does not radiate. No chest congestion or fever. The discomfort started last night.  Appointment made per protocol. Advised to call back for increase in any symptoms or having the ones mentioned above. Pt voiced understanding.  Reason for Disposition . [1] Chest pain lasting <= 5 minutes AND [2] NO chest pain or cardiac symptoms now(Exceptions: pains lasting a few seconds)  Answer Assessment - Initial Assessment Questions 1. LOCATION: "Where does it hurt?"       Center of chest 2. RADIATION: "Does the pain go anywhere else?" (e.g., into neck, jaw, arms, back)     no 3. ONSET: "When did the chest pain begin?" (Minutes, hours or days)      Last night 4. PATTERN "Does the pain come and go, or has it been constant since it started?"  "Does it get worse with exertion?"      Worse with movement can feel it then 5. DURATION: "How long does it last" (e.g., seconds, minutes, hours)     Only with movement 6. SEVERITY: "How bad is the pain?"  (e.g., Scale 1-10; mild, moderate, or severe)    - MILD (1-3): doesn't interfere with normal activities     - MODERATE (4-7): interferes with normal activities or awakens from sleep    - SEVERE (8-10): excruciating pain, unable to do any normal activities       Pain # 5 7. CARDIAC RISK FACTORS: "Do you have any history of heart problems or risk factors for heart disease?" (e.g., prior heart attack, angina; high blood pressure, diabetes, being overweight, high cholesterol, smoking, or strong family history of heart disease)     Yes family has hx of heart dx (mom) 8. PULMONARY RISK FACTORS: "Do you have any history of lung disease?"  (e.g., blood clots in lung, asthma, emphysema, birth  control pills)     no 9. CAUSE: "What do you think is causing the chest pain?"     Not sure  10. OTHER SYMPTOMS: "Do you have any other symptoms?" (e.g., dizziness, nausea, vomiting, sweating, fever, difficulty breathing, cough)       no 11. PREGNANCY: "Is there any chance you are pregnant?" "When was your last menstrual period?"       No chance and started period  Protocols used: CHEST PAIN-A-AH

## 2017-10-20 NOTE — Telephone Encounter (Signed)
Noted  

## 2017-11-08 ENCOUNTER — Ambulatory Visit (INDEPENDENT_AMBULATORY_CARE_PROVIDER_SITE_OTHER): Payer: Self-pay | Admitting: Physician Assistant

## 2017-11-08 ENCOUNTER — Encounter: Payer: Self-pay | Admitting: Physician Assistant

## 2017-11-08 VITALS — BP 110/60 | HR 70 | Temp 98.9°F | Wt 186.0 lb

## 2017-11-08 DIAGNOSIS — R300 Dysuria: Secondary | ICD-10-CM

## 2017-11-08 LAB — POCT URINALYSIS DIPSTICK
Bilirubin, UA: NEGATIVE
Blood, UA: NEGATIVE
Glucose, UA: NEGATIVE
KETONES UA: NEGATIVE
Nitrite, UA: NEGATIVE
PROTEIN UA: NEGATIVE
SPEC GRAV UA: 1.015 (ref 1.010–1.025)
UROBILINOGEN UA: NEGATIVE U/dL — AB
pH, UA: 5 (ref 5.0–8.0)

## 2017-11-08 MED ORDER — CIPROFLOXACIN HCL 500 MG PO TABS: 500.0000 mg | ORAL_TABLET | Freq: Two times a day (BID) | ORAL | 0 refills | Status: DC

## 2017-11-08 NOTE — Progress Notes (Signed)
Patient presents to clinic today c/o 1 week of urgency, frequency, hematuria and some dysuria. Endorses foul-smelling and cloudiness of urine. Denies fever, chills, nausea or vomiting. Notes some mild low back pain. Denies vaginal symptoms. Notes prior history of UTI.   Past Medical History:  Diagnosis Date  . HYPERSOMNIA 04/10/2010  . HYPERTHYROIDISM 08/03/2006    Current Outpatient Medications on File Prior to Visit  Medication Sig Dispense Refill  . methimazole (TAPAZOLE) 10 MG tablet TAKE 1 TABLET BY MOUTH DAILY. 90 tablet 3   No current facility-administered medications on file prior to visit.     Allergies  Allergen Reactions  . Sulfa Antibiotics Shortness Of Breath and Swelling    Family History  Problem Relation Age of Onset  . Hypertension Mother   . Hyperlipidemia Mother   . Sleep apnea Mother   . Thyroid disease Mother   . Stroke Mother   . Cancer Other        Aunt with Cancer- unknown type  . Lupus Other        Uncle  . Cancer Other        Lung Cancer-Uncle    Social History   Socioeconomic History  . Marital status: Married    Spouse name: Not on file  . Number of children: 3  . Years of education: Not on file  . Highest education level: Not on file  Occupational History  . Not on file  Social Needs  . Financial resource strain: Not on file  . Food insecurity:    Worry: Not on file    Inability: Not on file  . Transportation needs:    Medical: Not on file    Non-medical: Not on file  Tobacco Use  . Smoking status: Never Smoker  . Smokeless tobacco: Never Used  Substance and Sexual Activity  . Alcohol use: No    Alcohol/week: 0.0 standard drinks  . Drug use: No  . Sexual activity: Yes    Partners: Male    Birth control/protection: Surgical    Comment: Tubal  Lifestyle  . Physical activity:    Days per week: Not on file    Minutes per session: Not on file  . Stress: Not on file  Relationships  . Social connections:    Talks on phone:  Not on file    Gets together: Not on file    Attends religious service: Not on file    Active member of club or organization: Not on file    Attends meetings of clubs or organizations: Not on file    Relationship status: Not on file  Other Topics Concern  . 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  All other ROS are negative.  BP 110/60   Pulse 70   Temp 98.9 F (37.2 C)   Wt 186 lb (84.4 kg)   LMP 10/13/2017   SpO2 98%   BMI 30.48 kg/m   Physical Exam  Constitutional: She appears well-developed and well-nourished.  HENT:  Head: Normocephalic and atraumatic.  Eyes: Conjunctivae are normal.  Neck: Neck supple.  Cardiovascular: Normal rate, regular rhythm, normal heart sounds and intact distal pulses.  Pulmonary/Chest: Effort normal.  Abdominal: Soft. Bowel sounds are normal. She exhibits no distension. There is no tenderness.  Negative CVA tenderness  Psychiatric: She has a normal mood and affect.  Vitals reviewed.  Assessment/Plan: 1. Dysuria Classic symptoms. + history. Urine dip +  for LE. Will treat patient empirically for UTI. Has tolerated Cipro well in the past and notes this works best. Rx sent. Supportive measures reviewed. She has been encouraged to schedule follow-up with PCP or GYN giving this recurrent infections.   - POCT Urinalysis Dipstick - ciprofloxacin (CIPRO) 500 MG tablet; Take 1 tablet (500 mg total) by mouth 2 (two) times daily.  Dispense: 10 tablet; Refill: 0   Piedad Climes, PA-C

## 2017-11-08 NOTE — Patient Instructions (Signed)
Your symptoms are consistent with a bladder infection, also called acute cystitis. Please take your antibiotic (Cipro) as directed until all pills are gone.  Stay very well hydrated.  Consider a daily probiotic (Align, Culturelle, or Activia) to help prevent stomach upset caused by the antibiotic.  Taking a probiotic daily may also help prevent recurrent UTIs.  Also consider taking AZO (Phenazopyridine) tablets to help decrease pain with urination.   Please schedule a follow-up with your GYN or PCP to discuss further assessment giving frequency of infections recently.   Urinary Tract Infection A urinary tract infection (UTI) can occur any place along the urinary tract. The tract includes the kidneys, ureters, bladder, and urethra. A type of germ called bacteria often causes a UTI. UTIs are often helped with antibiotic medicine.  HOME CARE   If given, take antibiotics as told by your doctor. Finish them even if you start to feel better.  Drink enough fluids to keep your pee (urine) clear or pale yellow.  Avoid tea, drinks with caffeine, and bubbly (carbonated) drinks.  Pee often. Avoid holding your pee in for a long time.  Pee before and after having sex (intercourse).  Wipe from front to back after you poop (bowel movement) if you are a woman. Use each tissue only once. GET HELP RIGHT AWAY IF:   You have back pain.  You have lower belly (abdominal) pain.  You have chills.  You feel sick to your stomach (nauseous).  You throw up (vomit).  Your burning or discomfort with peeing does not go away.  You have a fever.  Your symptoms are not better in 3 days. MAKE SURE YOU:   Understand these instructions.  Will watch your condition.  Will get help right away if you are not doing well or get worse. Document Released: 07/16/2007 Document Revised: 10/22/2011 Document Reviewed: 08/28/2011 [facility] Patient Information 2015 [place], [place]. This information is not intended to replace  advice given to you by your health care provider. Make sure you discuss any questions you have with your health care provider.

## 2017-11-21 IMAGING — US US SOFT TISSUE HEAD/NECK
1 series · 14 of 25 positions shown · non-contrast
Comparison: 06/11/2005

CLINICAL DATA: Hyperthyroidism, anterior neck pain

EXAM:
THYROID ULTRASOUND
TECHNIQUE: Ultrasound examination of the thyroid gland and adjacent soft
tissues was performed.
INDICATION: Hyperthyroidism, scratched

[Series 1: us soft tissue head/neck · 0.07mm/px · 14 of 35 slices shown]
[im 1/35]
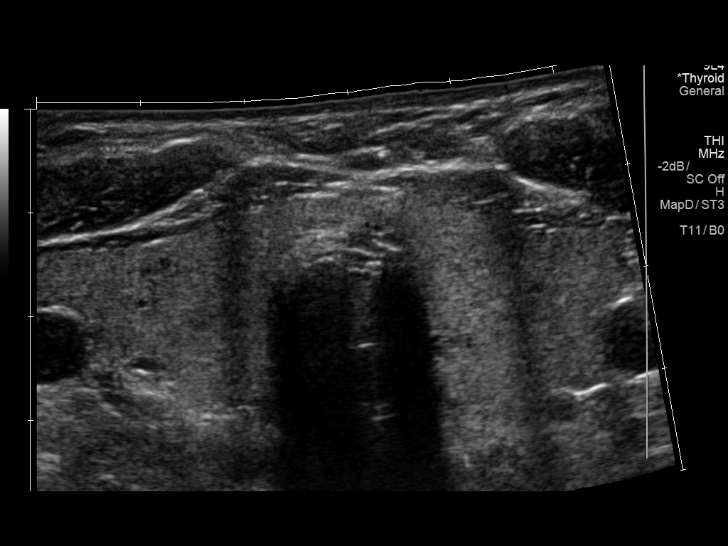
[im 3/35]
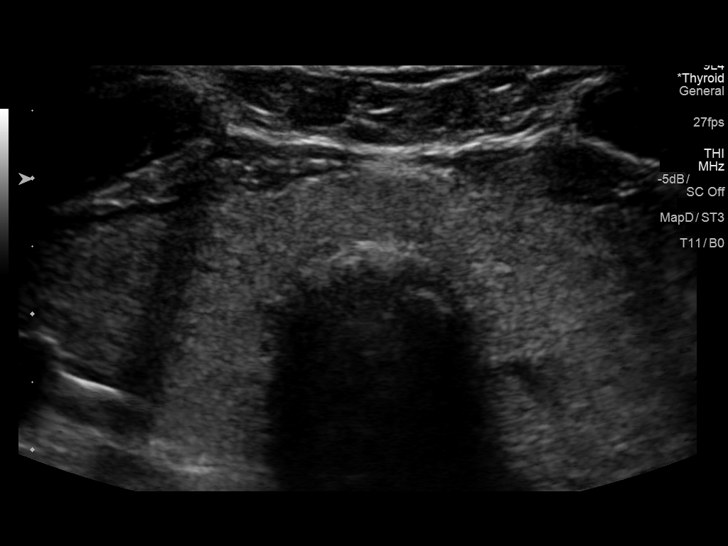
[im 6/35]
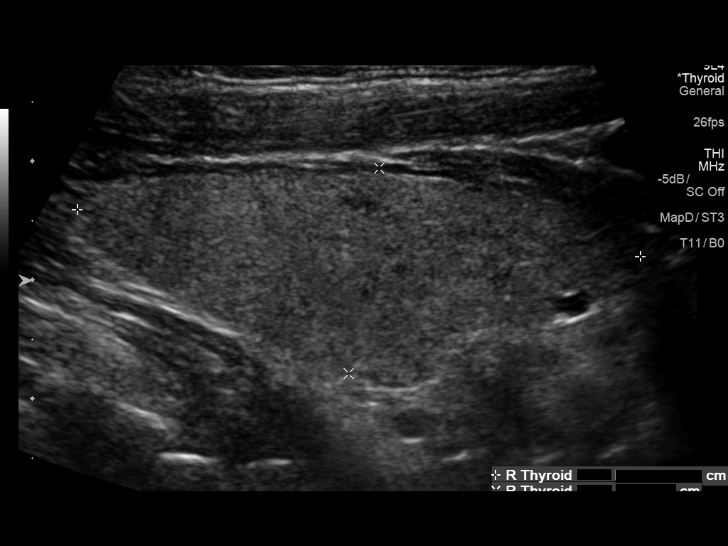
[im 9/35]
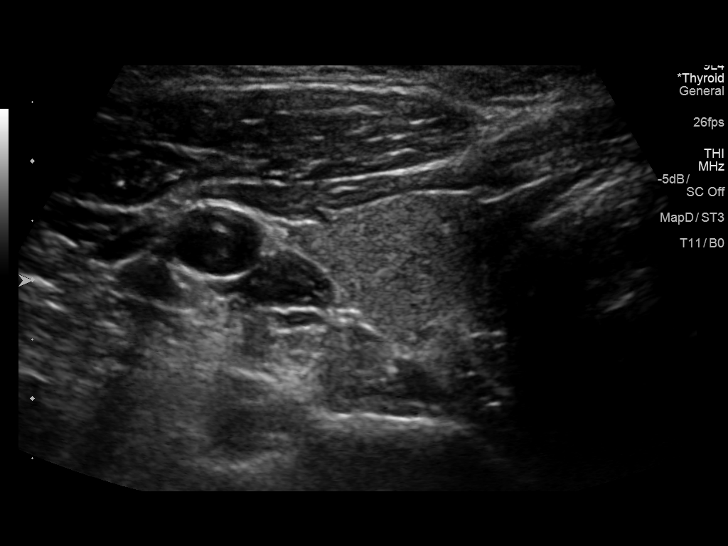
[im 12/35]
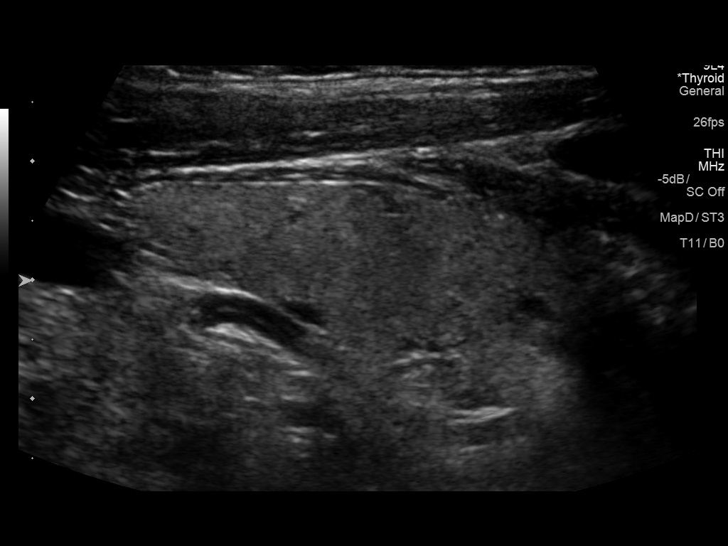
[im 13/35]
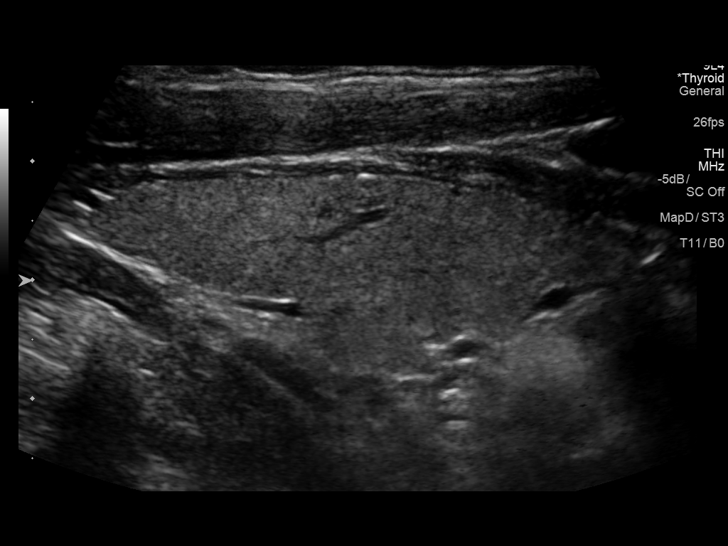
[im 16/35]
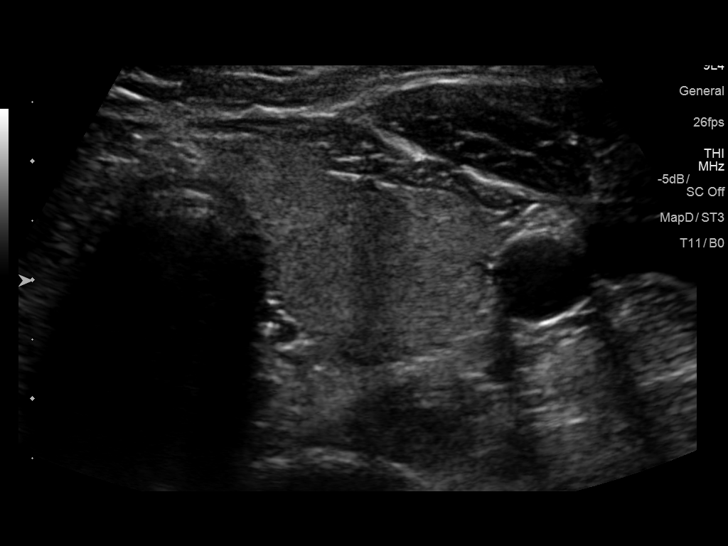
[im 19/35]
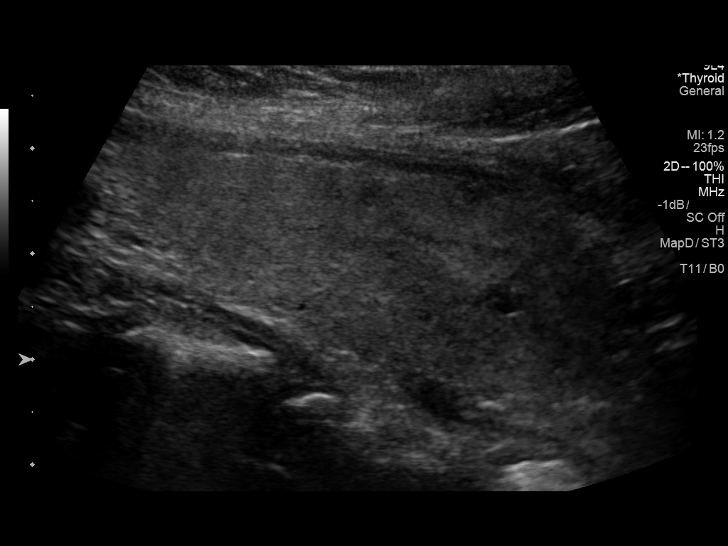
[im 22/35]
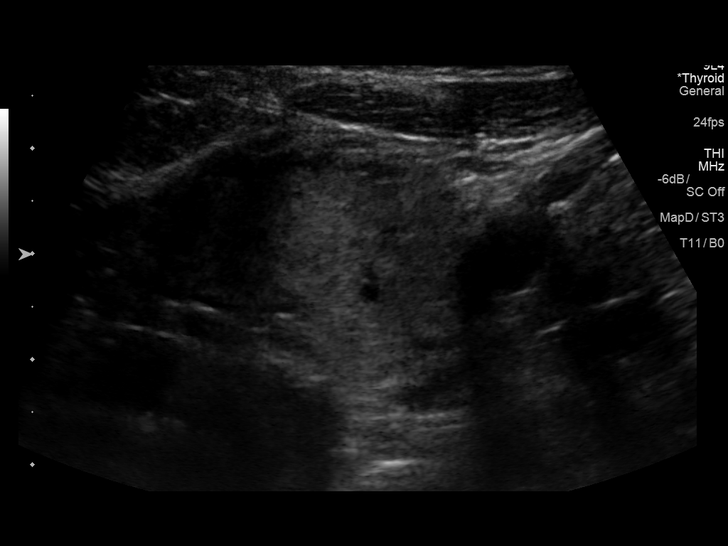
[im 23/35]
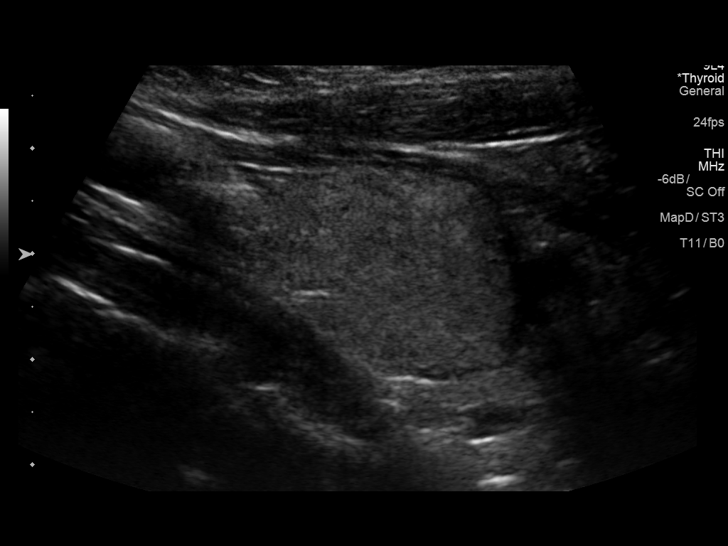
[im 26/35]
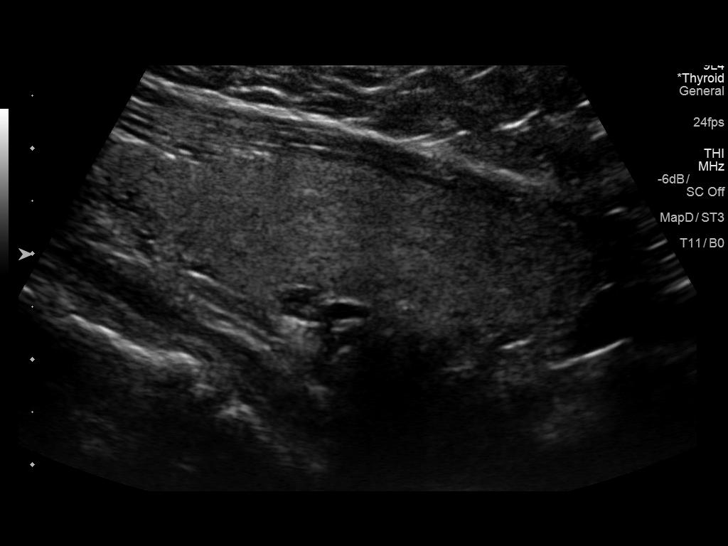
[im 29/35]
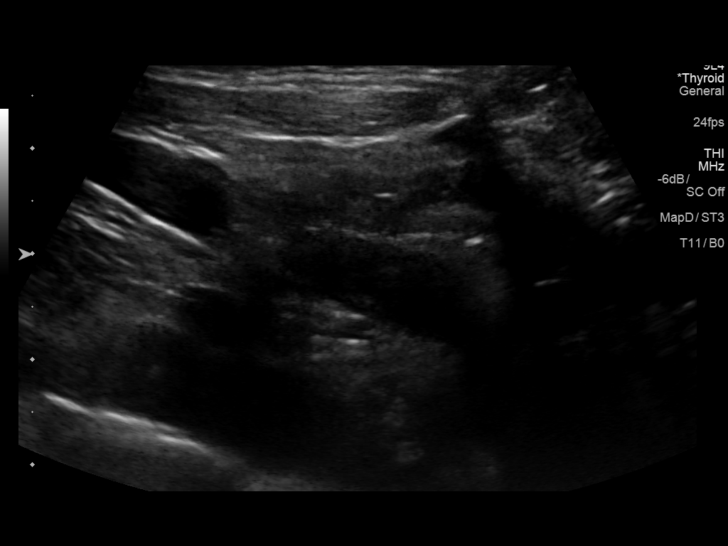
[im 32/35]
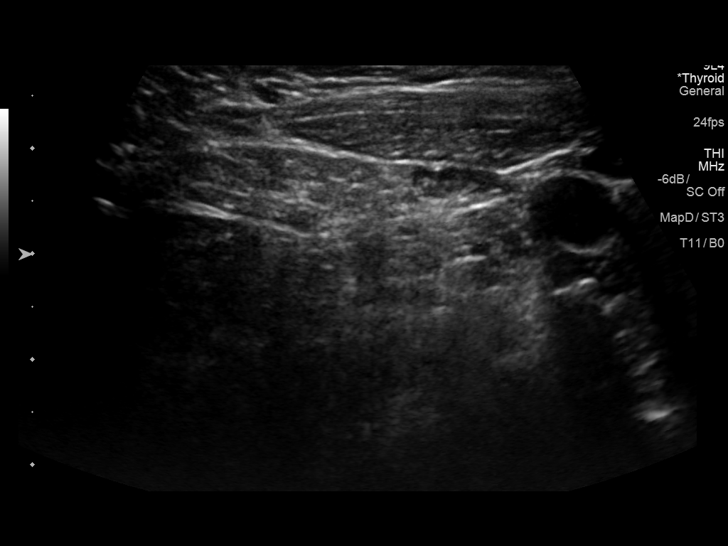
[im 35/35]
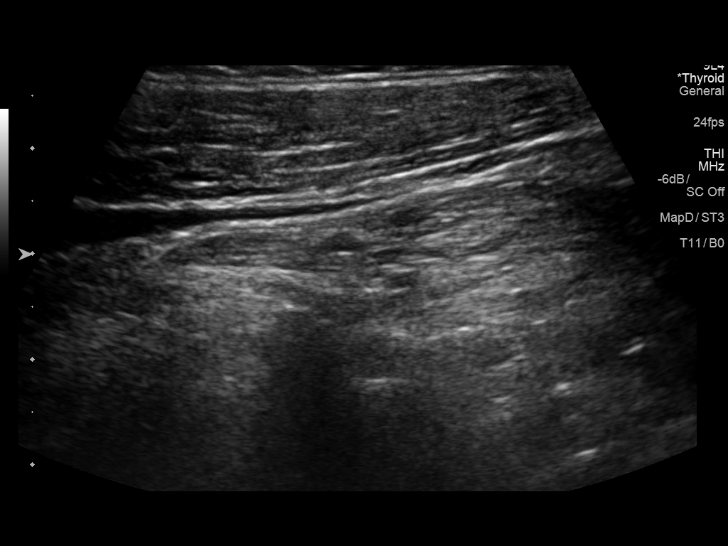

[14 of 25 positions shown; findings below may reference images not displayed]

FINDINGS: Parenchymal Echotexture: Normal

Isthmus: 0.5 cm

Right lobe: 4.8 x 1.8 x 2.4 cm

Left lobe: 5.1 x 2.0 x 2.0 cm

_________________________________________________________

Estimated total number of nodules >/= 1 cm: 0

Number of spongiform nodules >/=  2 cm not described below (TR1): 0

Number of mixed cystic and solid nodules >/= 1.5 cm not described
below (TR2): 0

_________________________________________________________

No discrete nodules are seen within the thyroid gland.
IMPRESSION: Normal thyroid ultrasound.

The above is in keeping with the ACR TI-RADS recommendations - [HOSPITAL] 8237;[DATE].

## 2017-12-11 ENCOUNTER — Ambulatory Visit (INDEPENDENT_AMBULATORY_CARE_PROVIDER_SITE_OTHER): Payer: 59 | Admitting: Endocrinology

## 2017-12-11 ENCOUNTER — Encounter: Payer: Self-pay | Admitting: Endocrinology

## 2017-12-11 VITALS — BP 114/72 | HR 82 | Ht 66.0 in | Wt 187.0 lb

## 2017-12-11 DIAGNOSIS — E0580 Other thyrotoxicosis without thyrotoxic crisis or storm: Secondary | ICD-10-CM | POA: Diagnosis not present

## 2017-12-11 LAB — LIPID PANEL
CHOL/HDL RATIO: 3
CHOLESTEROL: 141 mg/dL (ref 0–200)
HDL: 52.6 mg/dL (ref >39.00)
LDL Cholesterol: 74 mg/dL (ref 0–99)
NonHDL: 88.41
TRIGLYCERIDES: 70 mg/dL (ref 0.0–149.0)
VLDL: 14 mg/dL (ref 0.0–40.0)

## 2017-12-11 LAB — T4, FREE: Free T4: 0.79 ng/dL (ref 0.60–1.60)

## 2017-12-11 LAB — TSH: TSH: 1.74 u[IU]/mL (ref 0.35–4.50)

## 2017-12-11 MED ORDER — METHIMAZOLE 5 MG PO TABS: 5.0000 mg | ORAL_TABLET | Freq: Three times a day (TID) | ORAL | 2 refills | Status: DC

## 2017-12-11 NOTE — Progress Notes (Signed)
Subjective:    Patient ID: Janet [name], female    DOB: Dec 05, 1975, 42 y.o.   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  She sometimes misses the tapazole.  pt states she feels well in general.   Past Medical History:  Diagnosis Date  . HYPERSOMNIA 04/10/2010  . HYPERTHYROIDISM 08/03/2006    Past Surgical History:  Procedure Laterality Date  . CESAREAN SECTION     x3  . DILATION AND CURETTAGE OF UTERUS    . TUBAL LIGATION      Social History   Socioeconomic History  . Marital status: Married    Spouse name: Not on file  . Number of children: 3  . Years of education: Not on file  . Highest education level: Not on file  Occupational History  . Not on file  Social Needs  . Financial resource strain: Not on file  . Food insecurity:    Worry: Not on file    Inability: Not on file  . Transportation needs:    Medical: Not on file    Non-medical: Not on file  Tobacco Use  . Smoking status: Never Smoker  . Smokeless tobacco: Never Used  Substance and Sexual Activity  . Alcohol use: No    Alcohol/week: 0.0 standard drinks  . Drug use: No  . Sexual activity: Yes    Partners: Male    Birth control/protection: Surgical    Comment: Tubal  Lifestyle  . Physical activity:    Days per week: Not on file    Minutes per session: Not on file  . Stress: Not on file  Relationships  . Social connections:    Talks on phone: Not on file    Gets together: Not on file    Attends religious service: Not on file    Active member of club or organization: Not on file    Attends meetings of clubs or organizations: Not on file    Relationship status: Not on file  . Intimate partner violence:    Fear of current or ex partner: Not on file    Emotionally abused: Not on file   Physically abused: Not on file    Forced sexual activity: Not on file  Other Topics Concern  . 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   No current facility-administered medications on file prior to visit.     Allergies  Allergen Reactions  . Sulfa Antibiotics Shortness Of Breath and Swelling    Family History  Problem Relation Age of Onset  . Hypertension Mother   . Hyperlipidemia Mother   . Sleep apnea Mother   . Thyroid disease Mother   . Stroke Mother   . Cancer Other        Aunt with Cancer- unknown type  . Lupus Other        Uncle  . Cancer Other        Lung Cancer-Uncle    BP 114/72 (BP Location: Right Arm, Patient Position: Sitting, Cuff Size: Normal)   Pulse 82   Ht 5\' 6"  (1.676 m)   Wt 187 lb (84.8 kg)   SpO2 97%   BMI 30.18 kg/m  Review of Systems Denies fever and palpitations    Objective:   Physical Exam VITAL SIGNS:  See vs page GENERAL: no distress NECK: There is no palpable thyroid enlargement.  No thyroid nodule is palpable.  No palpable lymphadenopathy at the anterior neck.   Lab Results  Component Value Date   TSH 1.74 12/11/2017       Assessment & Plan:  Hyperthyroidism: well-controlled Missing medication: therefore, we'll reduce rx to 5 mg qd. Please come back for a follow-up appointment in 6 months

## 2017-12-11 NOTE — Patient Instructions (Addendum)
blood tests are requested for you today.  We'll let you know about the results.  if ever you have fever while taking methimazole, stop it and call us, because of the risk of a rare side-effect.  Let's check the ultrasound.  you will receive a phone call, about a day and time for an appointment.   Please come back for a follow-up appointment in 6 months.   If you decide to take the radioactive iodine piil, it works like this:  wqe would first check a thyroid "scan" (a special, but easy and painless type of thyroid x ray).  you go to the x-ray department of the hospital to swallow a pill, which contains a miniscule amount of radiation.  You will not notice any symptoms from this.  You will go back to the x-ray department the next day, to lie down in front of a camera.  The results of this will be sent to me.   Based on the results, i hope to order for you a treatment pill of radioactive iodine.  Although it is a larger amount of radiation, you will again notice no symptoms from this.  The pill is gone from your body in a few days (during which you should stay away from other people), but takes several months to work.  Therefore, please return here approximately 6-8 weeks after the treatment.  This treatment has been available for many years, and the only known side-effect is an underactive thyroid.  It is possible that i would eventually prescribe for you a thyroid hormone pill, which is very inexpensive.  You don't have to worry about side-effects of this thyroid hormone pill, because it is the same molecule your thyroid makes.

## 2017-12-14 MED FILL — methIMAzole 5 MG TABS: 5 | 30 days supply | Qty: 90 | Fill #0

## 2018-02-04 NOTE — Progress Notes (Signed)
42 y.o. [id number] Married [place] female here for annual exam.    Patient complaining of vulvar irritation and hematuria. She had a UTI when this occurred.  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   Having HSV issues.  Needs Valtrex and takes this prn.   Does not have problems with sleepiness.   Urine Dip:Neg  PCP: Abbe AmsterdamShannon Banks, MD    Endocrinology:  Dr. Everardo AllEllison.   Patient's last menstrual period was 01/12/2018 (exact date).     Period Cycle (Days): 30 Period Duration (Days): 5 Period Pattern: Regular Menstrual Flow: (heavy first 2-3 days then tapers) Menstrual Control: Maxi pad Menstrual Control Change Freq (Hours): changes pad every 2 hours on heaviest day Dysmenorrhea: (!) Mild Dysmenorrhea Symptoms: Cramping, Headache     Sexually active: Yes.   female The current method of family planning is tubal ligation.    Exercising: No.  The patient does not participate in regular exercise at present. Smoker:  no  Health Maintenance: Pap: 06-09-13 Neg:Neg HR HPV History of abnormal Pap:  no MMG:  Will start.  Colonoscopy:  n/a BMD:   n/a  Result  n/a TDaP: 04/2013 Gardasil:   no HIV:no Hep C: no Screening Labs:  ---- Flu vaccine:  Done.    reports that she has never smoked. She has never used smokeless tobacco. She reports that she does not drink alcohol or use drugs.  Past Medical History:  Diagnosis Date  . HYPERSOMNIA 04/10/2010  . HYPERTHYROIDISM 08/03/2006    Past Surgical History:  Procedure Laterality Date  . CESAREAN SECTION     x3  . DILATION AND CURETTAGE OF UTERUS    . TUBAL LIGATION      Current Outpatient Medications  Medication Sig Dispense Refill  . methimazole (TAPAZOLE) 5 MG tablet Take 1 tablet (5 mg total) by mouth 3 (three) times daily. 90 tablet 2   No current facility-administered medications for this visit.     Family History  Problem Relation Age of Onset  . Hypertension Mother   . Hyperlipidemia Mother   . Sleep apnea Mother   . Thyroid  disease Mother   . Stroke Mother   . Cancer Other        Aunt with Cancer- unknown type  . Lupus Other        Uncle  . Cancer Other        Lung Cancer-Uncle    Review of Systems  Genitourinary: Positive for hematuria.  All other systems reviewed and are negative.   Exam:   BP 114/80 (BP Location: Right Arm, Patient Position: Sitting, Cuff Size: Large)   Pulse 80   Resp 14   Ht 5\' 6"  (1.676 m)   Wt 190 lb 6.4 oz (86.4 kg)   LMP 01/12/2018 (Exact Date)   BMI 30.73 kg/m     General appearance: alert, cooperative and appears stated age Head: Normocephalic, without obvious abnormality, atraumatic Neck: no adenopathy, supple, symmetrical, trachea midline and thyroid normal to inspection and palpation Lungs: clear to auscultation bilaterally Breasts: normal appearance, no masses or tenderness, No nipple retraction or dimpling, No nipple discharge or bleeding, No axillary or supraclavicular adenopathy Heart: regular rate and rhythm Abdomen: soft, non-tender; no masses, no organomegaly Extremities: extremities normal, atraumatic, no cyanosis or edema Skin: Skin color, texture, turgor normal. No rashes or lesions Lymph nodes: Cervical, supraclavicular, and axillary nodes normal. No abnormal inguinal nodes palpated Neurologic: Grossly normal  Pelvic: External genitalia:  no lesions  Urethra:  normal appearing urethra with no masses, tenderness or lesions              Bartholins and Skenes: normal                 Vagina: normal appearing vagina with normal color and discharge, no lesions              Cervix: no lesions              Pap taken: Yes.   Bimanual Exam:  Uterus:  normal size, contour, position, consistency, mobility, non-tender              Adnexa: no mass, fullness, tenderness              Rectal exam: Yes.  .  Confirms.              Anus:  normal sphincter tone, no lesions  Chaperone was present for exam.  Assessment:   Well woman visit with normal  exam. Positive HSV I and II IgG.  Status post BTL.   Plan: Mammogram screening.  She will schedule.  Recommended self breast awareness. Pap and HR HPV as above. Guidelines for Calcium, Vitamin D, regular exercise program including cardiovascular and weight bearing exercise. Refill of Valtrex. Check CMP and vit D. Follow up annually and prn.   After visit summary provided.

## 2018-02-05 ENCOUNTER — Other Ambulatory Visit (HOSPITAL_COMMUNITY)
Admission: RE | Admit: 2018-02-05 | Discharge: 2018-02-05 | Disposition: A | Discharge status: Home / Self Care | Payer: 59 | Source: Ambulatory Visit | Attending: Obstetrics and Gynecology | Admitting: Obstetrics and Gynecology

## 2018-02-05 ENCOUNTER — Encounter: Payer: Self-pay | Admitting: Obstetrics and Gynecology

## 2018-02-05 ENCOUNTER — Other Ambulatory Visit: Payer: Self-pay

## 2018-02-05 ENCOUNTER — Ambulatory Visit (INDEPENDENT_AMBULATORY_CARE_PROVIDER_SITE_OTHER): Payer: 59 | Admitting: Obstetrics and Gynecology

## 2018-02-05 VITALS — BP 114/80 | HR 80 | Resp 14 | Ht 66.0 in | Wt 190.4 lb

## 2018-02-05 DIAGNOSIS — R7989 Other specified abnormal findings of blood chemistry: Secondary | ICD-10-CM

## 2018-02-05 DIAGNOSIS — R3121 Asymptomatic microscopic hematuria: Secondary | ICD-10-CM | POA: Diagnosis not present

## 2018-02-05 DIAGNOSIS — Z01419 Encounter for gynecological examination (general) (routine) without abnormal findings: Secondary | ICD-10-CM

## 2018-02-05 LAB — POCT URINALYSIS DIPSTICK
Bilirubin, UA: NEGATIVE
Blood, UA: NEGATIVE
Glucose, UA: NEGATIVE
Ketones, UA: NEGATIVE
LEUKOCYTES UA: NEGATIVE
Nitrite, UA: NEGATIVE
Protein, UA: NEGATIVE
Urobilinogen, UA: 0.2 E.U./dL
pH, UA: 5 (ref 5.0–8.0)

## 2018-02-05 MED ORDER — VALACYCLOVIR HCL 500 MG PO TABS: 500.0000 mg | ORAL_TABLET | Freq: Two times a day (BID) | ORAL | 11 refills | Status: DC

## 2018-02-05 MED FILL — VALACYCLOVIR HCL 500 MG TAB: 500 | 15 days supply | Qty: 30 | Fill #0

## 2018-02-05 NOTE — Patient Instructions (Signed)

## 2018-02-06 LAB — COMPREHENSIVE METABOLIC PANEL
A/G RATIO: 1.6 (ref 1.2–2.2)
ALK PHOS: 73 IU/L (ref 39–117)
ALT: 17 IU/L (ref 0–32)
AST: 10 IU/L (ref 0–40)
Albumin: 4.2 g/dL (ref 3.5–5.5)
BUN/Creatinine Ratio: 19 (ref 9–23)
BUN: 13 mg/dL (ref 6–24)
Bilirubin Total: <0.2 mg/dL (ref 0.0–1.2)
CALCIUM: 9.1 mg/dL (ref 8.7–10.2)
CHLORIDE: 104 mmol/L (ref 96–106)
CO2: 21 mmol/L (ref 20–29)
Creatinine, Ser: 0.68 mg/dL (ref 0.57–1.00)
GFR calc Af Amer: 127 mL/min/{1.73_m2} (ref >59)
GFR, EST NON AFRICAN AMERICAN: 110 mL/min/{1.73_m2} (ref >59)
Globulin, Total: 2.7 g/dL (ref 1.5–4.5)
Glucose: 80 mg/dL (ref 65–99)
POTASSIUM: 4.3 mmol/L (ref 3.5–5.2)
SODIUM: 140 mmol/L (ref 134–144)
Total Protein: 6.9 g/dL (ref 6.0–8.5)

## 2018-02-06 LAB — VITAMIN D 25 HYDROXY (VIT D DEFICIENCY, FRACTURES): VIT D 25 HYDROXY: 7.7 ng/mL — AB (ref 30.0–100.0)

## 2018-02-07 ENCOUNTER — Encounter: Payer: Self-pay | Admitting: Obstetrics and Gynecology

## 2018-02-07 NOTE — Addendum Note (Signed)
Addended by: Ardell IsaacsAMUNDSON C SILVA, BROOK E on: 02/07/2018 12:40 PM   Modules accepted: Orders

## 2018-02-08 ENCOUNTER — Telehealth: Payer: Self-pay

## 2018-02-08 ENCOUNTER — Encounter: Payer: Self-pay | Admitting: Obstetrics and Gynecology

## 2018-02-08 NOTE — Telephone Encounter (Signed)
-----   Message from Patton SallesBrook E Amundson C Silva, MD sent at 02/07/2018 12:40 PM EST ----- Please contact patient with results of testing.  Her CMP is normal.  Her vit D is significantly low.  I am recommending she take vit D 50,000 IU weekly for 3 months and then come in for a lab recheck.  Please make a follow up lab appointment.  I will place a future order.

## 2018-02-08 NOTE — Telephone Encounter (Signed)
Left message to call [name] at [phone]. 

## 2018-02-09 ENCOUNTER — Telehealth: Payer: Self-pay | Admitting: Obstetrics and Gynecology

## 2018-02-09 LAB — CYTOLOGY - PAP
Diagnosis: NEGATIVE
HPV: NOT DETECTED

## 2018-02-09 MED ORDER — VITAMIN D (ERGOCALCIFEROL) 1.25 MG (50000 UNIT) PO CAPS: 50000.0000 [IU] | ORAL_CAPSULE | ORAL | 0 refills | Status: DC

## 2018-02-09 MED FILL — VIT D2 1.25 MG (50,000 UNIT: 1.25 MG | 84 days supply | Qty: 12 | Fill #0

## 2018-02-09 NOTE — Telephone Encounter (Signed)
Spoke with patient. Results given. Patient verbalizes understanding. Rx for Vitamin D 50,000 IU weekly for 3 months #12 0RF sent to pharmacy on file. Lab appointment scheduled for 05/06/2018 at 8:30 am. Patient is agreeable to date and time. Encounter closed.

## 2018-02-09 NOTE — Telephone Encounter (Signed)
Patient sent the following correspondence through MyChart. Routing to triage to assist patient with request.  Good evening, Are the results from my pap smear available? I am not able to view on MyChart

## 2018-02-09 NOTE — Telephone Encounter (Signed)
Spoke with patient. Requesting 02/05/18 pap results. Advised pap results not resulted, can take up to 7 days. Advised our office will contact you once resulted and reviewed by provider. Patient verbalizes understanding, thankful for return call.   Encounter closed.

## 2018-05-06 ENCOUNTER — Other Ambulatory Visit: Payer: 59

## 2018-05-19 MED FILL — methIMAzole 5 MG TABS: 5 | 30 days supply | Qty: 90 | Fill #0

## 2018-06-17 ENCOUNTER — Other Ambulatory Visit: Payer: 59

## 2018-07-19 ENCOUNTER — Other Ambulatory Visit: Payer: Self-pay

## 2018-07-21 ENCOUNTER — Ambulatory Visit (INDEPENDENT_AMBULATORY_CARE_PROVIDER_SITE_OTHER): Payer: 59 | Admitting: Endocrinology

## 2018-07-21 ENCOUNTER — Encounter: Payer: Self-pay | Admitting: Endocrinology

## 2018-07-21 ENCOUNTER — Other Ambulatory Visit: Payer: Self-pay

## 2018-07-21 VITALS — BP 98/64 | HR 87 | Temp 98.2°F | Wt 192.4 lb

## 2018-07-21 DIAGNOSIS — E559 Vitamin D deficiency, unspecified: Secondary | ICD-10-CM | POA: Diagnosis not present

## 2018-07-21 DIAGNOSIS — E0580 Other thyrotoxicosis without thyrotoxic crisis or storm: Secondary | ICD-10-CM | POA: Diagnosis not present

## 2018-07-21 LAB — T4, FREE: Free T4: 0.94 ng/dL (ref 0.60–1.60)

## 2018-07-21 LAB — VITAMIN D 25 HYDROXY (VIT D DEFICIENCY, FRACTURES): VITD: 28.89 ng/mL — ABNORMAL LOW (ref 30.00–100.00)

## 2018-07-21 LAB — TSH: TSH: 1.21 u[IU]/mL (ref 0.35–4.50)

## 2018-07-21 MED ORDER — METHIMAZOLE 5 MG PO TABS: 5.0000 mg | ORAL_TABLET | Freq: Every day | ORAL | 2 refills | Status: DC

## 2018-07-21 NOTE — Progress Notes (Signed)
Subjective:    Patient ID: Janet [name], female    DOB: 24-Dec-1975, 43 y.o.   MRN: [id number]  HPI Pt returns for f/u of hyperthyroidism (dx'ed during a 2007 pregnancy; nuc med scan in 2007 and [place] in 2018 were c/w Grave's Dz; she has chosen to continue the methimazole indefinitely, as she cannot be isolated from her children; she has had TL).  She seldom misses the tapazole.  pt states she feels well in general.   She has finished ergocalciferol.  Past Medical History:  Diagnosis Date  . HYPERSOMNIA 04/10/2010  . HYPERTHYROIDISM 08/03/2006  . Low vitamin D level 2019   level = 7    Past Surgical History:  Procedure Laterality Date  . CESAREAN SECTION     x3  . DILATION AND CURETTAGE OF UTERUS    . TUBAL LIGATION      Social History   Socioeconomic History  . Marital status: Married    Spouse name: Not on file  . Number of children: 3  . Years of education: Not on file  . Highest education level: Not on file  Occupational History  . Not on file  Social Needs  . Financial resource strain: Not on file  . Food insecurity:    Worry: Not on file    Inability: Not on file  . Transportation needs:    Medical: Not on file    Non-medical: Not on file  Tobacco Use  . Smoking status: Never Smoker  . Smokeless tobacco: Never Used  Substance and Sexual Activity  . Alcohol use: No    Alcohol/week: 0.0 standard drinks  . Drug use: No  . Sexual activity: Yes    Partners: Male    Birth control/protection: Surgical    Comment: Tubal  Lifestyle  . Physical activity:    Days per week: Not on file    Minutes per session: Not on file  . Stress: Not on file  Relationships  . Social connections:    Talks on phone: Not on file    Gets together: Not on file    Attends religious service: Not on file    Active member of club or organization: Not on file    Attends meetings of clubs or organizations: Not on file    Relationship status: Not on file  . Intimate partner violence:     Fear of current or ex partner: Not on file    Emotionally abused: Not on file    Physically abused: Not on file    Forced sexual activity: Not on file  Other Topics Concern  . 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 valACYclovir (VALTREX) 500 MG tablet Take 1 tablet (500 mg total) by mouth 2 (two) times daily. Take one tablet BID at onset of symptoms for 3 days. 30 tablet 11   No current facility-administered medications on file prior to visit.     Allergies  Allergen Reactions  . Sulfa Antibiotics Shortness Of Breath and Swelling    Family History  Problem Relation Age of Onset  . Hypertension Mother   . Hyperlipidemia Mother   . Sleep apnea Mother   . Thyroid disease Mother   . Stroke Mother   . Cancer Other        Aunt with Cancer- unknown type  . Lupus Other  Uncle  . Cancer Other        Lung Cancer-Uncle    BP 98/64 (BP Location: Left Arm, Patient Position: Sitting, Cuff Size: Normal)   Pulse 87   Temp 98.2 F (36.8 C) (Oral)   Wt 192 lb 6.4 oz (87.3 kg)   SpO2 97%   BMI 31.05 kg/m   Review of Systems Denies fever.      Objective:   Physical Exam VITAL SIGNS:  See vs page GENERAL: no distress NECK: There is no palpable thyroid enlargement.  No thyroid nodule is palpable.  No palpable lymphadenopathy at the anterior neck.    Lab Results  Component Value Date   TSH 1.21 07/21/2018       Assessment & Plan:  Hyperthyroidism: well-controlled.  Vit-D def: due for recheck.    Patient Instructions  blood tests are requested for you today.  We'll let you know about the results.  If ever you have fever while taking methimazole, stop it and call us, even if the reason is obvious, because of the risk of a rare side-effect. It is best to never miss the medication.  However, if you do miss it, next best is to double up the next time.    Please come back for a follow-up appointment in 6 months.

## 2018-07-21 NOTE — Patient Instructions (Addendum)
blood tests are requested for you today.  We'll let you know about the results.  If ever you have fever while taking methimazole, stop it and call us, even if the reason is obvious, because of the risk of a rare side-effect.   It is best to never miss the medication.  However, if you do miss it, next best is to double up the next time.     Please come back for a follow-up appointment in 6 months.    

## 2018-08-19 ENCOUNTER — Encounter: Payer: Self-pay | Admitting: Family Medicine

## 2018-08-19 ENCOUNTER — Other Ambulatory Visit: Payer: Self-pay

## 2018-08-19 ENCOUNTER — Ambulatory Visit (INDEPENDENT_AMBULATORY_CARE_PROVIDER_SITE_OTHER): Payer: 59 | Admitting: Family Medicine

## 2018-08-19 DIAGNOSIS — R195 Other fecal abnormalities: Secondary | ICD-10-CM | POA: Diagnosis not present

## 2018-08-19 DIAGNOSIS — R110 Nausea: Secondary | ICD-10-CM | POA: Diagnosis not present

## 2018-08-19 MED ORDER — ONDANSETRON HCL 4 MG PO TABS: 4.0000 mg | ORAL_TABLET | Freq: Three times a day (TID) | ORAL | 0 refills | Status: DC | PRN

## 2018-08-19 MED FILL — ONDANSETRON HCL 4 MG TABLET: 4 | 7 days supply | Qty: 20 | Fill #0

## 2018-08-19 NOTE — Progress Notes (Signed)
Virtual Visit via Video Note  I connected with Janet [name] on 08/19/18 at  4:30 PM EDT by a video enabled telemedicine application and verified that I am speaking with the correct person using two identifiers.  Location patient: home Location provider:work or home office Persons participating in the virtual visit: patient, provider  I discussed the limitations of evaluation and management by telemedicine and the availability of in person appointments. The patient expressed understanding and agreed to proceed.   HPI: Pt is a 43 yo female with pmh sig for hyperthyroidism on methimazole, vit D deficiency.     Pt felt nauseous and had watery, loose stools while at work yesterday.  Pt contacted health at work, advised to stay out of work until symptom free x 48 hrs.  Pt notes decreased energy today, HA, and decreased appetite.  Trying to stay hydrated.  Denies fever, vomiting, sore throat, rhinorrhea, post nasal drainage, cough.   Pt states someone at work was sick a few wks ago.  Pt's husband is not sick.  Pt denies possibility of pregnancy, s/p tubal ligation.  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  ROS: See pertinent positives and negatives per HPI.  Past Medical History:  Diagnosis Date  . HYPERSOMNIA 04/10/2010  . HYPERTHYROIDISM 08/03/2006  . Low vitamin D level 2019   level = 7    Past Surgical History:  Procedure Laterality Date  . CESAREAN SECTION     x3  . DILATION AND CURETTAGE OF UTERUS    . TUBAL LIGATION      Family History  Problem Relation Age of Onset  . Hypertension Mother   . Hyperlipidemia Mother   . Sleep apnea Mother   . Thyroid disease Mother   . Stroke Mother   . Cancer Other        Aunt with Cancer- unknown type  . Lupus Other        Uncle  . Cancer Other        Lung Cancer-Uncle     Current Outpatient Medications:  .  methimazole (TAPAZOLE) 5 MG tablet, Take 1 tablet (5 mg total) by mouth daily., Disp: 90 tablet,  Rfl: 2 .  valACYclovir (VALTREX) 500 MG tablet, Take 1 tablet (500 mg total) by mouth 2 (two) times daily. Take one tablet BID at onset of symptoms for 3 days., Disp: 30 tablet, Rfl: 11  EXAM:  VITALS per patient if applicable:  RR between 12-20 bpm  GENERAL: alert, oriented, appears mildly fatigued but in no acute distress  HEENT: atraumatic, conjunctiva clear, no obvious abnormalities on inspection of external nose and ears.  MMM.  NECK: normal movements of the head and neck  LUNGS: on inspection no signs of respiratory distress, breathing rate appears normal, no obvious gross SOB, gasping or wheezing  CV: no obvious cyanosis  MS: moves all visible extremities without noticeable abnormality  PSYCH/NEURO: pleasant and cooperative, no obvious depression or anxiety, speech and thought processing grossly intact  ASSESSMENT AND PLAN:  Discussed the following assessment and plan:  Nausea  -supportive care - Plan: ondansetron (ZOFRAN) 4 MG tablet  Loose stools  -possible viral enteritis. Given symptoms cannot r/o possible COVID infection.  Consider thyroid dysfunction given pt's h/o hyperthyroidism on methimazole. -last tsh 1.21 on 07/21/18. -discussed supportive care: bland diet, staying hydrated, imodium, probiotic. -for continued symptoms consider labs (CBC, TSH, CMP, lipase)  If pt having worsened or continued symptoms tomorrow discussed obtaining COVID-19 testing.  Order  would need to be placed.  F/u prn  I discussed the assessment and treatment plan with the patient. The patient was provided an opportunity to ask questions and all were answered. The patient agreed with the plan and demonstrated an understanding of the instructions.   The patient was advised to call back or seek an in-person evaluation if the symptoms worsen or if the condition fails to improve as anticipated.  Janet SaintShannon R Jonahtan Manseau, MD

## 2018-10-22 MED FILL — methIMAzole 5 MG TABS: 5 | 30 days supply | Qty: 90 | Fill #0

## 2018-11-12 ENCOUNTER — Other Ambulatory Visit: Payer: Self-pay

## 2018-11-12 ENCOUNTER — Encounter: Payer: Self-pay | Admitting: Family Medicine

## 2018-11-12 ENCOUNTER — Ambulatory Visit: Payer: 59 | Admitting: Family Medicine

## 2018-11-12 VITALS — BP 108/80 | HR 79 | Temp 97.9°F | Ht 66.25 in | Wt 192.0 lb

## 2018-11-12 DIAGNOSIS — M26651 Arthropathy of right temporomandibular joint: Secondary | ICD-10-CM | POA: Diagnosis not present

## 2018-11-12 DIAGNOSIS — M2141 Flat foot [pes planus] (acquired), right foot: Secondary | ICD-10-CM | POA: Diagnosis not present

## 2018-11-12 DIAGNOSIS — M2142 Flat foot [pes planus] (acquired), left foot: Secondary | ICD-10-CM | POA: Diagnosis not present

## 2018-11-12 MED ORDER — DICLOFENAC SODIUM 75 MG PO TBEC: 75.0000 mg | DELAYED_RELEASE_TABLET | Freq: Two times a day (BID) | ORAL | 0 refills | Status: DC

## 2018-11-12 MED FILL — DICLOFENAC SODIUM 75 MG TAB: 75 | 30 days supply | Qty: 60 | Fill #0

## 2018-11-12 NOTE — Progress Notes (Signed)
   Subjective:    Patient ID: Janet [name], female    DOB: Jan 31, 1976, 43 y.o.   MRN: [id number]  HPI Here for 2 issues. First she has had pain in the left foot for the past 3 months. This is centered along the medial side of the arch. She is on her feet all day at work. She usually wears flats or sandals to work. Also for the past 2 weeks she has had pain in the right ear. No trouble hearing. No sinus congestion or URI symptoms.    Review of Systems  Constitutional: Negative.   HENT: Positive for ear pain. Negative for congestion, ear discharge, facial swelling, hearing loss, sinus pressure and sinus pain.   Eyes: Negative.   Respiratory: Negative.   Cardiovascular: Negative.   Musculoskeletal: Positive for arthralgias.       Objective:   Physical Exam Constitutional:      General: She is not in acute distress.    Appearance: Normal appearance.  HENT:     Right Ear: Tympanic membrane, ear canal and external ear normal.     Left Ear: Tympanic membrane, ear canal and external ear normal.     Nose: Nose normal.     Mouth/Throat:     Pharynx: Oropharynx is clear.     Comments: The right TMJ is tender, no crepitus and full ROM Eyes:     Conjunctiva/sclera: Conjunctivae normal.  Neck:     Musculoskeletal: Normal range of motion.  Cardiovascular:     Rate and Rhythm: Normal rate and regular rhythm.     Pulses: Normal pulses.     Heart sounds: Normal heart sounds.  Pulmonary:     Effort: Pulmonary effort is normal.     Breath sounds: Normal breath sounds.  Musculoskeletal:     Comments: Both foot arches are quite flat. The right medial arch is tender, no swelling   Lymphadenopathy:     Cervical: No cervical adenopathy.  Neurological:     Mental Status: She is alert.           Assessment & Plan:  She has TMJ pain and we will treat with Diclofenac. I advised her not to chew gum. 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 I advised her to wear gel arch supports inside  supportive shoes at work. The Diclofenac should help this as well. Recheck prn.  Alysia Penna, MD

## 2018-12-02 MED FILL — FLUARIX QUADRIVALENT 0.5 ML: 0.5 | 1 days supply | Qty: 1 | Fill #0

## 2018-12-31 ENCOUNTER — Telehealth: Payer: Self-pay | Admitting: Obstetrics and Gynecology

## 2018-12-31 ENCOUNTER — Encounter: Payer: Self-pay | Admitting: *Deleted

## 2018-12-31 NOTE — Telephone Encounter (Signed)
Return call to patient. Reports increased fatigue similar to last year when had low Vitamin D. States level was rechecked by Dr Loanne Drilling in June and was 28.9 so was told to take 1000 IU daily. She never filled RX and now has very low energy. Wants prescription strength Vitamin D called in. Advised office visit needed for evaluation. Patient requested lab only appointment since we never ordered follow-up level. Advised we reviewed recommended recheck after 12 weeks at phone call on 02-09-18 at 0827. 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  Advised she has not been seen in 11 months (scheduled for annual in Jan 21) and needs evaluation before medication is called in.   States we should give lab appointment since we will just check level and give RX. Again offered office visit to discuss symptoms and patient declined. States she will discuss with Dr Quincy Simmonds in January.  Patient disconnected call after 11 mins 39 seconds. Terence Lux CMA was brought into office to witness call and could hear patient yelling through headset.  Routing to Dr Quincy Simmonds. Encounter closed.

## 2018-12-31 NOTE — Telephone Encounter (Signed)
Patient is calling requesting a refill on Vit D. Patient confirmed pharmacy as [facility].

## 2019-01-03 MED FILL — VALACYCLOVIR HCL 500 MG TAB: 500 | 15 days supply | Qty: 30 | Fill #1

## 2019-01-17 ENCOUNTER — Ambulatory Visit: Payer: 59 | Admitting: Endocrinology

## 2019-01-28 ENCOUNTER — Other Ambulatory Visit: Payer: Self-pay | Admitting: Endocrinology

## 2019-01-28 MED FILL — methIMAzole 5 MG TABS: 5 | 30 days supply | Qty: 90 | Fill #0

## 2019-01-28 NOTE — Telephone Encounter (Signed)
Closed as duplicate. Already addressed in previous encounter

## 2019-01-28 NOTE — Telephone Encounter (Signed)
MEDICATION: methimazole (TAPAZOLE) 5 MG tablet  PHARMACY:   [place], [place] - [address] [facility]. Phone:  [phone]  Fax:  [phone]      IS THIS A 90 DAY SUPPLY : Yes  IS PATIENT OUT OF MEDICATION: No  IF NOT; HOW MUCH IS LEFT: 2 tablets  LAST APPOINTMENT DATE: 07/21/2018  NEXT APPOINTMENT DATE:@1 /09/2019  DO WE HAVE YOUR PERMISSION TO LEAVE A DETAILED MESSAGE: Yes  OTHER COMMENTS:    **Let patient know to contact pharmacy at the end of the day to make sure medication is ready. **  ** Please notify patient to allow 48-72 hours to process**  **Encourage patient to contact the pharmacy for refills or they can request refills through [facility]**

## 2019-02-10 ENCOUNTER — Telehealth: Payer: Self-pay | Admitting: Family Medicine

## 2019-02-10 ENCOUNTER — Encounter: Payer: Self-pay | Admitting: Family Medicine

## 2019-02-10 ENCOUNTER — Other Ambulatory Visit: Payer: Self-pay | Admitting: Family Medicine

## 2019-02-10 DIAGNOSIS — Z1231 Encounter for screening mammogram for malignant neoplasm of breast: Secondary | ICD-10-CM

## 2019-02-10 NOTE — Telephone Encounter (Signed)
Janet [name] from [facility] called stating that pt is complaining of a burning in her left breast. Imaging needs Dr B to place an order for a Diagnostic Bilateral of the left breast and an ultra sound. As soon as Janet [name] places order they should see it and will call pt and sch and proceed.

## 2019-02-14 ENCOUNTER — Other Ambulatory Visit: Payer: Self-pay

## 2019-02-14 NOTE — Telephone Encounter (Signed)
Ok for orders? 

## 2019-02-14 NOTE — Telephone Encounter (Signed)
Ok to place orders

## 2019-02-15 ENCOUNTER — Other Ambulatory Visit: Payer: Self-pay | Admitting: *Deleted

## 2019-02-16 ENCOUNTER — Ambulatory Visit: Payer: 59 | Admitting: Obstetrics and Gynecology

## 2019-02-16 ENCOUNTER — Other Ambulatory Visit: Payer: Self-pay

## 2019-02-16 DIAGNOSIS — N644 Mastodynia: Secondary | ICD-10-CM

## 2019-02-16 NOTE — Telephone Encounter (Signed)
Orders placed.

## 2019-02-16 NOTE — Telephone Encounter (Signed)
Pt notified through MyChart portal 

## 2019-02-18 ENCOUNTER — Ambulatory Visit (INDEPENDENT_AMBULATORY_CARE_PROVIDER_SITE_OTHER): Payer: 59 | Admitting: Endocrinology

## 2019-02-18 ENCOUNTER — Encounter: Payer: Self-pay | Admitting: Endocrinology

## 2019-02-18 ENCOUNTER — Other Ambulatory Visit: Payer: Self-pay

## 2019-02-18 DIAGNOSIS — E0580 Other thyrotoxicosis without thyrotoxic crisis or storm: Secondary | ICD-10-CM | POA: Diagnosis not present

## 2019-02-18 MED ORDER — METHIMAZOLE 5 MG PO TABS: 5.0000 mg | ORAL_TABLET | Freq: Every day | ORAL | 0 refills | Status: DC

## 2019-02-18 NOTE — Patient Instructions (Addendum)
Blood tests are requested for you today.  We'll let you know about the results.  If ever you have fever while taking methimazole, stop it and call us, even if the reason is obvious, because of the risk of a rare side-effect.  Please come back for a follow-up appointment in 6 months.   

## 2019-02-18 NOTE — Progress Notes (Signed)
Subjective:    Patient ID: Janet [name], female    DOB: 06-07-1975, 44 y.o.   MRN: [id number]  HPI  telehealth visit today via doxy video visit.  Alternatives to telehealth are presented to this patient, and the patient agrees to the telehealth visit.  Pt is advised of the cost of the visit, and agrees to this, also.   Patient is at home, and I am at the office.   Persons attending the telehealth visit: the patient and I.  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  She takes tapazole as rx'ed.  pt states she feels well in general.   Past Medical History:  Diagnosis Date  . HYPERSOMNIA 04/10/2010  . HYPERTHYROIDISM 08/03/2006  . Low vitamin D level 2019   level = 7    Past Surgical History:  Procedure Laterality Date  . CESAREAN SECTION     x3  . DILATION AND CURETTAGE OF UTERUS    . TUBAL LIGATION      Social History   Socioeconomic History  . Marital status: Married    Spouse name: Not on file  . Number of children: 3  . Years of education: Not on file  . Highest education level: Not on file  Occupational History  . Not on file  Tobacco Use  . Smoking status: Never Smoker  . Smokeless tobacco: Never Used  Substance and Sexual Activity  . Alcohol use: No    Alcohol/week: 0.0 standard drinks  . Drug use: No  . Sexual activity: Yes    Partners: Male    Birth control/protection: Surgical    Comment: Tubal  Other Topics Concern  . 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 Difficulty of Paying Living Expenses: Not on file  Food Insecurity:   . Worried About Programme researcher, broadcasting/film/video in the Last Year: Not on file  . Ran Out of Food in the Last Year: Not on file  Transportation Needs:   . Lack of  Transportation (Medical): Not on file  . Lack of Transportation (Non-Medical): Not on file  Physical Activity:   . Days of Exercise per Week: Not on file  . Minutes of Exercise per Session: Not on file  Stress:   . Feeling of Stress : Not on file  Social Connections:   . Frequency of Communication with Friends and Family: Not on file  . Frequency of Social Gatherings with Friends and Family: Not on file  . Attends Religious Services: Not on file  . Active Member of Clubs or Organizations: Not on file  . Attends Banker Meetings: Not on file  . Marital Status: Not on file  Intimate Partner Violence:   . Fear of Current or Ex-Partner: Not on file  . Emotionally Abused: Not on file  . Physically Abused: Not on file  . Sexually Abused: Not on file    Current Outpatient Medications on File Prior to Visit  Medication Sig Dispense Refill  . diclofenac (VOLTAREN) 75 MG EC tablet Take 1 tablet (75 mg total) by mouth 2 (two) times daily. 60 tablet 0  . valACYclovir (VALTREX) 500 MG tablet Take 1 tablet (500 mg total) by mouth  2 (two) times daily. Take one tablet BID at onset of symptoms for 3 days. 30 tablet 11   No current facility-administered medications on file prior to visit.    Allergies  Allergen Reactions  . Sulfa Antibiotics Shortness Of Breath and Swelling    Family History  Problem Relation Age of Onset  . Hypertension Mother   . Hyperlipidemia Mother   . Sleep apnea Mother   . Thyroid disease Mother   . Stroke Mother   . Cancer Other        Aunt with Cancer- unknown type  . Lupus Other        Uncle  . Cancer Other        Lung Cancer-Uncle    There were no vitals taken for this visit.   Review of Systems Denies fever.      Objective:   Physical Exam VITAL SIGNS:  See vs page GENERAL: no distress NECK: no obvious neck swelling.        Assessment & Plan:  Hyperthyroidism: due for recheck.  Domestic situation: she cannot be isolated for RAI  rx.   Patient Instructions  Blood tests are requested for you today.  We'll let you know about the results.   If ever you have fever while taking methimazole, stop it and call us, even if the reason is obvious, because of the risk of a rare side-effect.   Please come back for a follow-up appointment in 6 months.

## 2019-03-11 ENCOUNTER — Other Ambulatory Visit: Payer: 59

## 2019-03-18 ENCOUNTER — Other Ambulatory Visit: Payer: Self-pay

## 2019-03-18 ENCOUNTER — Other Ambulatory Visit (INDEPENDENT_AMBULATORY_CARE_PROVIDER_SITE_OTHER): Payer: 59

## 2019-03-18 DIAGNOSIS — E0580 Other thyrotoxicosis without thyrotoxic crisis or storm: Secondary | ICD-10-CM | POA: Diagnosis not present

## 2019-03-18 LAB — T4, FREE: Free T4: 0.9 ng/dL (ref 0.60–1.60)

## 2019-03-18 LAB — TSH: TSH: 2 u[IU]/mL (ref 0.35–4.50)

## 2019-04-08 ENCOUNTER — Ambulatory Visit
Admission: RE | Admit: 2019-04-08 | Discharge: 2019-04-08 | Disposition: A | Discharge status: Home / Self Care | Payer: 59 | Source: Ambulatory Visit | Attending: Family Medicine | Admitting: Family Medicine

## 2019-04-08 ENCOUNTER — Other Ambulatory Visit: Payer: Self-pay

## 2019-04-08 DIAGNOSIS — Z1231 Encounter for screening mammogram for malignant neoplasm of breast: Secondary | ICD-10-CM | POA: Diagnosis not present

## 2019-05-20 ENCOUNTER — Other Ambulatory Visit: Payer: Self-pay | Admitting: Endocrinology

## 2019-05-20 MED FILL — methIMAzole 5 MG TABS: 5 | 30 days supply | Qty: 90 | Fill #0

## 2019-09-03 ENCOUNTER — Telehealth: Payer: Self-pay | Admitting: Endocrinology

## 2019-09-05 ENCOUNTER — Other Ambulatory Visit: Payer: Self-pay

## 2019-09-05 DIAGNOSIS — E0500 Thyrotoxicosis with diffuse goiter without thyrotoxic crisis or storm: Secondary | ICD-10-CM

## 2019-09-05 MED ORDER — METHIMAZOLE 5 MG PO TABS: 5.0000 mg | ORAL_TABLET | Freq: Three times a day (TID) | ORAL | 0 refills | Status: DC

## 2019-09-05 MED FILL — methIMAzole 5 MG TABS: 5 | 30 days supply | Qty: 90 | Fill #0

## 2019-09-05 NOTE — Telephone Encounter (Signed)
Patient scheduled appointment for 10/07/19 - she is requesting her refill for the Methimazole.

## 2019-09-05 NOTE — Telephone Encounter (Signed)
Next Appt With Internal Medicine Romero Belling, MD) 10/07/2019 at 9:00 AM  Outpatient Medication Detail   Disp Refills Start End   methimazole (TAPAZOLE) 5 MG tablet 90 tablet 0 09/05/2019    Sig - Route: Take 1 tablet (5 mg total) by mouth 3 (three) times daily. OVERDUE FOR AN APPT. WILL ONLY PROVIDE 30 DAY SUPPLY - Oral   Sent to pharmacy as: methimazole (TAPAZOLE) 5 MG tablet   E-Prescribing Status: Receipt confirmed by pharmacy (09/05/2019 9:26 AM EDT)

## 2019-09-05 NOTE — Telephone Encounter (Signed)
This has already been addressed today. No further action to be taken.

## 2019-09-05 NOTE — Telephone Encounter (Signed)
[place] pt 

## 2019-09-09 ENCOUNTER — Other Ambulatory Visit: Payer: Self-pay

## 2019-09-09 ENCOUNTER — Ambulatory Visit (INDEPENDENT_AMBULATORY_CARE_PROVIDER_SITE_OTHER): Payer: 59 | Admitting: Family Medicine

## 2019-09-09 ENCOUNTER — Other Ambulatory Visit: Payer: Self-pay | Admitting: Family Medicine

## 2019-09-09 ENCOUNTER — Encounter: Payer: Self-pay | Admitting: Family Medicine

## 2019-09-09 VITALS — BP 108/76 | HR 86 | Temp 97.9°F | Wt 193.0 lb

## 2019-09-09 DIAGNOSIS — Z0000 Encounter for general adult medical examination without abnormal findings: Secondary | ICD-10-CM | POA: Diagnosis not present

## 2019-09-09 DIAGNOSIS — R635 Abnormal weight gain: Secondary | ICD-10-CM

## 2019-09-09 DIAGNOSIS — E559 Vitamin D deficiency, unspecified: Secondary | ICD-10-CM | POA: Diagnosis not present

## 2019-09-09 DIAGNOSIS — R5383 Other fatigue: Secondary | ICD-10-CM

## 2019-09-09 NOTE — Patient Instructions (Signed)
Preventive Care 40-42 Years Old, Female Preventive care refers to visits with your health care provider and lifestyle choices that can promote health and wellness. This includes:  A yearly physical exam. This may also be called an annual well check.  Regular dental visits and eye exams.  Immunizations.  Screening for certain conditions.  Healthy lifestyle choices, such as eating a healthy diet, getting regular exercise, not using drugs or products that contain nicotine and tobacco, and limiting alcohol use. What can I expect for my preventive care visit? Physical exam Your health care provider will check your:  Height and weight. This may be used to calculate body mass index (BMI), which tells if you are at a healthy weight.  Heart rate and blood pressure.  Skin for abnormal spots. Counseling Your health care provider may ask you questions about your:  Alcohol, tobacco, and drug use.  Emotional well-being.  Home and relationship well-being.  Sexual activity.  Eating habits.  Work and work environment.  Method of birth control.  Menstrual cycle.  Pregnancy history. What immunizations do I need?  Influenza (flu) vaccine  This is recommended every year. Tetanus, diphtheria, and pertussis (Tdap) vaccine  You may need a Td booster every 10 years. Varicella (chickenpox) vaccine  You may need this if you have not been vaccinated. Zoster (shingles) vaccine  You may need this after age 60. Measles, mumps, and rubella (MMR) vaccine  You may need at least one dose of MMR if you were born in 1957 or later. You may also need a second dose. Pneumococcal conjugate (PCV13) vaccine  You may need this if you have certain conditions and were not previously vaccinated. Pneumococcal polysaccharide (PPSV23) vaccine  You may need one or two doses if you smoke cigarettes or if you have certain conditions. Meningococcal conjugate (MenACWY) vaccine  You may need this if you  have certain conditions. Hepatitis A vaccine  You may need this if you have certain conditions or if you travel or work in places where you may be exposed to hepatitis A. Hepatitis B vaccine  You may need this if you have certain conditions or if you travel or work in places where you may be exposed to hepatitis B. Haemophilus influenzae type b (Hib) vaccine  You may need this if you have certain conditions. Human papillomavirus (HPV) vaccine  If recommended by your health care provider, you may need three doses over 6 months. You may receive vaccines as individual doses or as more than one vaccine together in one shot (combination vaccines). Talk with your health care provider about the risks and benefits of combination vaccines. What tests do I need? Blood tests  Lipid and cholesterol levels. These may be checked every 5 years, or more frequently if you are over 50 years old.  Hepatitis C test.  Hepatitis B test. Screening  Lung cancer screening. You may have this screening every year starting at age 55 if you have a 30-pack-year history of smoking and currently smoke or have quit within the past 15 years.  Colorectal cancer screening. All adults should have this screening starting at age 50 and continuing until age 75. Your health care provider may recommend screening at age 45 if you are at increased risk. You will have tests every 1-10 years, depending on your results and the type of screening test.  Diabetes screening. This is done by checking your blood sugar (glucose) after you have not eaten for a while (fasting). You may have this   done every 1-3 years.  Mammogram. This may be done every 1-2 years. Talk with your health care provider about when you should start having regular mammograms. This may depend on whether you have a family history of breast cancer.  BRCA-related cancer screening. This may be done if you have a family history of breast, ovarian, tubal, or peritoneal  cancers.  Pelvic exam and Pap test. This may be done every 3 years starting at age 76. Starting at age 37, this may be done every 5 years if you have a Pap test in combination with an HPV test. Other tests  Sexually transmitted disease (STD) testing.  Bone density scan. This is done to screen for osteoporosis. You may have this scan if you are at high risk for osteoporosis. Follow these instructions at home: Eating and drinking  Eat a diet that includes fresh fruits and vegetables, whole grains, lean protein, and low-fat dairy.  Take vitamin and mineral supplements as recommended by your health care provider.  Do not drink alcohol if: ? Your health care provider tells you not to drink. ? You are pregnant, may be pregnant, or are planning to become pregnant.  If you drink alcohol: ? Limit how much you have to 0-1 drink a day. ? Be aware of how much alcohol is in your drink. In the [place] one drink equals one 12 oz bottle of beer (355 mL), one 5 oz glass of wine (148 mL), or one 1 oz glass of hard liquor (44 mL). Lifestyle  Take daily care of your teeth and gums.  Stay active. Exercise for at least 30 minutes on 5 or more days each week.  Do not use any products that contain nicotine or tobacco, such as cigarettes, e-cigarettes, and chewing tobacco. If you need help quitting, ask your health care provider.  If you are sexually active, practice safe sex. Use a condom or other form of birth control (contraception) in order to prevent pregnancy and STIs (sexually transmitted infections).  If told by your health care provider, take low-dose aspirin daily starting at age 60. What's next?  Visit your health care provider once a year for a well check visit.  Ask your health care provider how often you should have your eyes and teeth checked.  Stay up to date on all vaccines. This information is not intended to replace advice given to you by your health care provider. Make sure you  discuss any questions you have with your health care provider. Document Revised: 10/08/2017 Document Reviewed: 10/08/2017 Elsevier Patient Education  [employer].  Fatigue If you have fatigue, you feel tired all the time and have a lack of energy or a lack of motivation. Fatigue may make it difficult to start or complete tasks because of exhaustion. In general, occasional or mild fatigue is often a normal response to activity or life. However, long-lasting (chronic) or extreme fatigue may be a symptom of a medical condition. Follow these instructions at home: General instructions  Watch your fatigue for any changes.  Go to bed and get up at the same time every day.  Avoid fatigue by pacing yourself during the day and getting enough sleep at night.  Maintain a healthy weight. Medicines  Take over-the-counter and prescription medicines only as told by your health care provider.  Take a multivitamin, if told by your health care provider.  Do not use herbal or dietary supplements unless they are approved by your health care provider. Activity  Exercise regularly, as told by your health care provider.  Use or practice techniques to help you relax, such as yoga, tai chi, meditation, or massage therapy. Eating and drinking   Avoid heavy meals in the evening.  Eat a well-balanced diet, which includes lean proteins, whole grains, plenty of fruits and vegetables, and low-fat dairy products.  Avoid consuming too much caffeine.  Avoid the use of alcohol.  Drink enough fluid to keep your urine pale yellow. Lifestyle  Change situations that cause you stress. Try to keep your work and personal schedule in balance.  Do not use any products that contain nicotine or tobacco, such as cigarettes and e-cigarettes. If you need help quitting, ask your health care provider.  Do not use drugs. Contact a health care provider if:  Your fatigue does not get better.  You have a  fever.  You suddenly lose or gain weight.  You have headaches.  You have trouble falling asleep or sleeping through the night.  You feel angry, guilty, anxious, or sad.  You are unable to have a bowel movement (constipation).  Your skin is dry.  You have swelling in your legs or another part of your body. Get help right away if:  You feel confused.  Your vision is blurry.  You feel faint or you pass out.  You have a severe headache.  You have severe pain in your abdomen, your back, or the area between your waist and hips (pelvis).  You have chest pain, shortness of breath, or an irregular or fast heartbeat.  You are unable to urinate, or you urinate less than normal.  You have abnormal bleeding, such as bleeding from the rectum, vagina, nose, lungs, or nipples.  You vomit blood.  You have thoughts about hurting yourself or others. If you ever feel like you may hurt yourself or others, or have thoughts about taking your own life, get help right away. You can go to your nearest emergency department or call:  Your local emergency services (911 in the [place]  A suicide crisis helpline, such as the [facility] at 817-695-0761. This is open 24 hours a day. Summary  If you have fatigue, you feel tired all the time and have a lack of energy or a lack of motivation.  Fatigue may make it difficult to start or complete tasks because of exhaustion.  Long-lasting (chronic) or extreme fatigue may be a symptom of a medical condition.  Exercise regularly, as told by your health care provider.  Change situations that cause you stress. Try to keep your work and personal schedule in balance. This information is not intended to replace advice given to you by your health care provider. Make sure you discuss any questions you have with your health care provider. Document Revised: 08/18/2018 Document Reviewed: 10/22/2016 Elsevier Patient Education  2020  [employer].

## 2019-09-09 NOTE — Progress Notes (Signed)
Subjective:     Janet [name] is a 44 y.o. female and is here for a comprehensive physical exam. The patient reports problems - Fatigue.  Patient endorses feeling fatigued.  Felt better when he was taking prescription vitamin D.  Started taking vitamin D 3000 IU but has not noticed improvement.  Patient endorses being told she snores at night.  Also notes "moving a lot at night" and waking up intermittently. Wakes up feeling rested.  Could take a nap, but has always felt like that.  Pt trying to lose weight.  Started a 1300-1400 calorie diet plan from work (works at the [employer]).  Pt notes scale at work says she weighs 1 scale in clinic.  LMP 08/17/19.  Pap up-to-date per patient, due next year.  Pt received both doses of Pfizer COVID vaccine.  Mammogram done 04/08/2019.  Social History   Socioeconomic History  . Marital status: Married    Spouse name: Not on file  . Number of children: 3  . Years of education: Not on file  . Highest education level: Not on file  Occupational History  . Not on file  Tobacco Use  . Smoking status: Never Smoker  . Smokeless tobacco: Never Used  Vaping Use  . Vaping Use: Never used  Substance and Sexual Activity  . Alcohol use: No    Alcohol/week: 0.0 standard drinks  . Drug use: No  . Sexual activity: Yes    Partners: Male    Birth control/protection: Surgical    Comment: Tubal  Other Topics Concern  . 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 Difficulty of Paying Living Expenses:   Food Insecurity:   . Worried About Charity fundraiser in the Last Year:   . Arboriculturist in the Last Year:   Transportation Needs:   . Film/video editor (Medical):   Marland Kitchen Lack of Transportation (Non-Medical):   Physical Activity:   . Days of Exercise per Week:   . Minutes of Exercise per Session:   Stress:   . Feeling of Stress :   Social Connections:   .  Frequency of Communication with Friends and Family:   . Frequency of Social Gatherings with Friends and Family:   . Attends Religious Services:   . Active Member of Clubs or Organizations:   . Attends Archivist Meetings:   Marland Kitchen Marital Status:   Intimate Partner Violence:   . Fear of Current or Ex-Partner:   . Emotionally Abused:   Marland Kitchen Physically Abused:   . Sexually Abused:    Health Maintenance  Topic Date Due  . Hepatitis C Screening  Never done  . COVID-19 Vaccine (1) Never done  . HIV Screening  Never done  . INFLUENZA VACCINE  09/11/2019  . PAP SMEAR-Modifier  02/05/2021  . TETANUS/TDAP  04/20/2023    The following portions of the patient's history were reviewed and updated as appropriate: allergies, current medications, past family history, past medical history, past social history, past surgical history and problem list.  Review of Systems Pertinent items noted in HPI and remainder of comprehensive ROS otherwise negative.   Objective:    BP 108/76 (BP Location: Left Arm, Patient Position: Sitting, Cuff Size: Normal)   Pulse 86   Temp 97.9 F (36.6 C) (Oral)   Wt 193 lb (87.5 kg)   LMP 08/17/2019 (Exact  Date)   SpO2 98%   BMI 30.92 kg/m  General appearance: alert, cooperative and no distress Head: Normocephalic, without obvious abnormality, atraumatic Eyes: conjunctivae/corneas clear. PERRL, EOM's intact. Fundi benign. Ears: normal TM's and external ear canals both ears Nose: Nares normal. Septum midline. Mucosa normal. No drainage or sinus tenderness. Throat: lips, mucosa, and tongue normal; teeth and gums normal Neck: no adenopathy, no carotid bruit, no JVD, supple, symmetrical, trachea midline and thyroid not enlarged, symmetric, no tenderness/mass/nodules Lungs: clear to auscultation bilaterally Heart: regular rate and rhythm, S1, S2 normal, no murmur, click, rub or gallop Abdomen: soft, non-tender; bowel sounds normal; no masses,  no  organomegaly Extremities: extremities normal, atraumatic, no cyanosis or edema Pulses: 2+ and symmetric Skin: Skin color, texture, turgor normal. No rashes or lesions Lymph nodes: Cervical, supraclavicular, and axillary nodes normal. Neurologic: Alert and oriented X 3, normal strength and tone. Normal symmetric reflexes. Normal coordination and gait    Assessment:    Healthy female exam with fatigue      Plan:     Anticipatory guidance given including wearing seatbelts, smoke detectors in the home, increasing physical activity, increasing p.o. intake of water and vegetables. -We will obtain labs. -Mammogram up-to-date -Covid vaccine completed -Given handout -Next CPE in 1 year See After Visit Summary for Counseling Recommendations   Fatigue, unspecified type  -Discussed possible mild OSA given history of snoring. -Discussed supportive care including start exercising, lose weight, change sleep position -We will obtain labs - Plan: CMP with eGFR([facility]), CBC with Differential/Platelet, TSH, Vitamin D, 25-hydroxy, Vitamin B12, Hemoglobin A1c  Vitamin D deficiency  - Plan: Vitamin D, 25-hydroxy  Weight gain -Losing some weight. -Continue lifestyle modifications and calorie restriction -Discussed increasing physical activity -We will continue to monitor - Plan: TSH, Hemoglobin A1c  Follow-up as needed  Grier Mitts, MD

## 2019-09-10 ENCOUNTER — Encounter: Payer: Self-pay | Admitting: Family Medicine

## 2019-09-10 LAB — CBC WITH DIFFERENTIAL/PLATELET
Absolute Monocytes: 435 cells/uL (ref 200–950)
Basophils Absolute: 21 cells/uL (ref 0–200)
Basophils Relative: 0.4 %
Eosinophils Absolute: 318 cells/uL (ref 15–500)
Eosinophils Relative: 6 %
HCT: 39.8 % (ref 35.0–45.0)
Hemoglobin: 13.6 g/dL (ref 11.7–15.5)
Lymphs Abs: 1299 cells/uL (ref 850–3900)
MCH: 28.6 pg (ref 27.0–33.0)
MCHC: 34.2 g/dL (ref 32.0–36.0)
MCV: 83.8 fL (ref 80.0–100.0)
MPV: 9.8 fL (ref 7.5–12.5)
Monocytes Relative: 8.2 %
Neutro Abs: 3228 cells/uL (ref 1500–7800)
Neutrophils Relative %: 60.9 %
Platelets: 401 10*3/uL — ABNORMAL HIGH (ref 140–400)
RBC: 4.75 10*6/uL (ref 3.80–5.10)
RDW: 13 % (ref 11.0–15.0)
Total Lymphocyte: 24.5 %
WBC: 5.3 10*3/uL (ref 3.8–10.8)

## 2019-09-10 LAB — COMPLETE METABOLIC PANEL WITH GFR
AG Ratio: 1.3 (calc) (ref 1.0–2.5)
ALT: 15 U/L (ref 6–29)
AST: 14 U/L (ref 10–30)
Albumin: 4.1 g/dL (ref 3.6–5.1)
Alkaline phosphatase (APISO): 68 U/L (ref 31–125)
BUN: 11 mg/dL (ref 7–25)
CO2: 24 mmol/L (ref 20–32)
Calcium: 9 mg/dL (ref 8.6–10.2)
Chloride: 105 mmol/L (ref 98–110)
Creat: 0.74 mg/dL (ref 0.50–1.10)
GFR, Est African American: 116 mL/min/{1.73_m2} (ref >=60)
GFR, Est Non African American: 100 mL/min/{1.73_m2} (ref >=60)
Globulin: 3.1 g/dL (calc) (ref 1.9–3.7)
Glucose, Bld: 103 mg/dL — ABNORMAL HIGH (ref 65–99)
Potassium: 4 mmol/L (ref 3.5–5.3)
Sodium: 136 mmol/L (ref 135–146)
Total Bilirubin: 0.3 mg/dL (ref 0.2–1.2)
Total Protein: 7.2 g/dL (ref 6.1–8.1)

## 2019-09-10 LAB — HEMOGLOBIN A1C
Hgb A1c MFr Bld: 5.7 % of total Hgb — ABNORMAL HIGH (ref <5.7)
Mean Plasma Glucose: 117 (calc)
eAG (mmol/L): 6.5 (calc)

## 2019-09-10 LAB — VITAMIN B12: Vitamin B-12: 419 pg/mL (ref 200–1100)

## 2019-09-10 LAB — TSH: TSH: 1.39 mIU/L

## 2019-09-10 LAB — VITAMIN D 25 HYDROXY (VIT D DEFICIENCY, FRACTURES): Vit D, 25-Hydroxy: 30 ng/mL (ref 30–100)

## 2019-09-13 ENCOUNTER — Other Ambulatory Visit: Payer: Self-pay

## 2019-09-13 MED ORDER — VALACYCLOVIR HCL 500 MG PO TABS: 500.0000 mg | ORAL_TABLET | Freq: Two times a day (BID) | ORAL | 3 refills | Status: DC

## 2019-09-13 MED FILL — VALACYCLOVIR HCL 500 MG TAB: 500 | 15 days supply | Qty: 30 | Fill #0

## 2019-09-15 ENCOUNTER — Other Ambulatory Visit: Payer: Self-pay

## 2019-09-15 DIAGNOSIS — Z13220 Encounter for screening for lipoid disorders: Secondary | ICD-10-CM

## 2019-09-15 NOTE — Telephone Encounter (Signed)
Pt is wondering if the lipid panel gets added will she have to come back in to give blood? If so she is only off on Fridays and would like to know before tomorrow so she can get scheduled.   Pt would like a response either through my chart or a phone call [phone]

## 2019-09-16 ENCOUNTER — Other Ambulatory Visit: Payer: 59

## 2019-09-16 ENCOUNTER — Other Ambulatory Visit: Payer: Self-pay

## 2019-09-16 DIAGNOSIS — Z13220 Encounter for screening for lipoid disorders: Secondary | ICD-10-CM

## 2019-09-17 LAB — LIPID PANEL
Cholesterol: 149 mg/dL (ref <200)
HDL: 54 mg/dL (ref >=50)
LDL Cholesterol (Calc): 81 mg/dL (calc)
Non-HDL Cholesterol (Calc): 95 mg/dL (calc) (ref <130)
Total CHOL/HDL Ratio: 2.8 (calc) (ref <5.0)
Triglycerides: 64 mg/dL (ref <150)

## 2019-09-19 NOTE — Telephone Encounter (Signed)
Pt had her Lipid lab done on Friday 09/16/2019

## 2019-09-23 MED FILL — VALACYCLOVIR HCL 500 MG TAB: 500 | 15 days supply | Qty: 30 | Fill #0

## 2019-09-30 ENCOUNTER — Ambulatory Visit: Payer: 59 | Admitting: Family Medicine

## 2019-10-07 ENCOUNTER — Other Ambulatory Visit: Payer: Self-pay | Admitting: Endocrinology

## 2019-10-07 ENCOUNTER — Other Ambulatory Visit: Payer: Self-pay

## 2019-10-07 ENCOUNTER — Ambulatory Visit (INDEPENDENT_AMBULATORY_CARE_PROVIDER_SITE_OTHER): Payer: 59 | Admitting: Endocrinology

## 2019-10-07 ENCOUNTER — Encounter: Payer: Self-pay | Admitting: Endocrinology

## 2019-10-07 DIAGNOSIS — E0500 Thyrotoxicosis with diffuse goiter without thyrotoxic crisis or storm: Secondary | ICD-10-CM

## 2019-10-07 MED ORDER — METHIMAZOLE 5 MG PO TABS: 5.0000 mg | ORAL_TABLET | Freq: Every day | ORAL | 3 refills | Status: DC

## 2019-10-07 MED FILL — methIMAzole 5 MG TABS: 5 | 90 days supply | Qty: 90 | Fill #0

## 2019-10-07 NOTE — Progress Notes (Signed)
Subjective:    Patient ID: Janet [name], female    DOB: [birth year], 44 y.o.   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  She takes tapazole as rx'ed.  pt states she feels well in general, except for slight tremor.   Past Medical History:  Diagnosis Date  . HYPERSOMNIA 04/10/2010  . HYPERTHYROIDISM 08/03/2006  . Low vitamin D level 2019   level = 7    Past Surgical History:  Procedure Laterality Date  . CESAREAN SECTION     x3  . DILATION AND CURETTAGE OF UTERUS    . TUBAL LIGATION      Social History   Socioeconomic History  . Marital status: Married    Spouse name: Not on file  . Number of children: 3  . Years of education: Not on file  . Highest education level: Not on file  Occupational History  . Not on file  Tobacco Use  . Smoking status: Never Smoker  . Smokeless tobacco: Never Used  Vaping Use  . Vaping Use: Never used  Substance and Sexual Activity  . Alcohol use: No    Alcohol/week: 0.0 standard drinks  . Drug use: No  . Sexual activity: Yes    Partners: Male    Birth control/protection: Surgical    Comment: Tubal  Other Topics Concern  . 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 Difficulty of Paying Living Expenses: Not on file  Food Insecurity:   . Worried About Programme researcher, broadcasting/film/video in the Last Year: Not on file  . Ran Out of Food in the Last Year: Not on file  Transportation Needs:   . Lack of Transportation (Medical): Not on file  . Lack of Transportation (Non-Medical): Not on file  Physical Activity:   . Days of Exercise per Week: Not on file  . Minutes of Exercise per Session: Not on file  Stress:   . Feeling of Stress : Not on file  Social  Connections:   . Frequency of Communication with Friends and Family: Not on file  . Frequency of Social Gatherings with Friends and Family: Not on file  . Attends Religious Services: Not on file  . Active Member of Clubs or Organizations: Not on file  . Attends Banker Meetings: Not on file  . Marital Status: Not on file  Intimate Partner Violence:   . Fear of Current or Ex-Partner: Not on file  . Emotionally Abused: Not on file  . Physically Abused: Not on file  . Sexually Abused: Not on file    Current Outpatient Medications on File Prior to Visit  Medication Sig Dispense Refill  . valACYclovir (VALTREX) 500 MG tablet Take 1 tablet (500 mg total) by mouth 2 (two) times daily. Take one tablet BID at onset of symptoms for 3 days. 30 tablet 3   No current facility-administered medications on file prior to visit.    Allergies  Allergen Reactions  . Sulfa Antibiotics Shortness Of Breath and Swelling    Family History  Problem Relation Age of Onset  . Hypertension Mother   . Hyperlipidemia Mother   . Sleep apnea Mother   .  Thyroid disease Mother   . Stroke Mother   . Cancer Other        Aunt with Cancer- unknown type  . Lupus Other        Uncle  . Cancer Other        Lung Cancer-Uncle    BP 126/76   Pulse 94   Ht 5' 6.25" (1.683 m)   Wt 191 lb (86.6 kg)   SpO2 98%   BMI 30.60 kg/m    Review of Systems  denies fever    Objective:   Physical Exam VITAL SIGNS:  See vs page GENERAL: no distress NECK: There is no palpable thyroid enlargement.  No thyroid nodule is palpable.  No palpable lymphadenopathy at the anterior neck.  Lab Results  Component Value Date   TSH 1.39 09/09/2019       Assessment & Plan:  Hyperthyrodism. well-controlled.    Patient Instructions  Please continue the same methimazole.   If ever you have fever while taking methimazole, stop it and call us, even if the reason is obvious, because of the risk of a rare side-effect.   It is best to never miss the medication.  However, if you do miss it, next best is to double up the next time.  Please come back for a follow-up appointment in 6 months.

## 2019-10-07 NOTE — Patient Instructions (Addendum)
Please continue the same methimazole.    ?If ever you have fever while taking methimazole, stop it and call us, even if the reason is obvious, because of the risk of a rare side-effect.   ?It is best to never miss the medication.  However, if you do miss it, next best is to double up the next time.   ?Please come back for a follow-up appointment in 6 months.   ?

## 2019-10-08 ENCOUNTER — Telehealth: Payer: 59 | Admitting: Nurse Practitioner

## 2019-10-08 DIAGNOSIS — N760 Acute vaginitis: Secondary | ICD-10-CM

## 2019-10-08 DIAGNOSIS — B9689 Other specified bacterial agents as the cause of diseases classified elsewhere: Secondary | ICD-10-CM

## 2019-10-08 MED ORDER — METRONIDAZOLE 500 MG PO TABS: 500.0000 mg | ORAL_TABLET | Freq: Two times a day (BID) | ORAL | 0 refills | Status: DC

## 2019-10-08 NOTE — Progress Notes (Signed)

## 2019-12-16 ENCOUNTER — Other Ambulatory Visit: Payer: Self-pay | Admitting: Endocrinology

## 2019-12-16 DIAGNOSIS — E0500 Thyrotoxicosis with diffuse goiter without thyrotoxic crisis or storm: Secondary | ICD-10-CM

## 2019-12-16 MED FILL — methIMAzole 5 MG TABS: 5 | 90 days supply | Qty: 90 | Fill #0

## 2019-12-23 ENCOUNTER — Telehealth: Payer: 59 | Admitting: Family Medicine

## 2019-12-27 ENCOUNTER — Encounter: Payer: Self-pay | Admitting: Family Medicine

## 2020-01-02 ENCOUNTER — Encounter: Payer: Self-pay | Admitting: Family Medicine

## 2020-01-02 ENCOUNTER — Telehealth (INDEPENDENT_AMBULATORY_CARE_PROVIDER_SITE_OTHER): Payer: PRIVATE HEALTH INSURANCE | Admitting: Family Medicine

## 2020-01-02 DIAGNOSIS — M79672 Pain in left foot: Secondary | ICD-10-CM

## 2020-01-02 DIAGNOSIS — G8929 Other chronic pain: Secondary | ICD-10-CM

## 2020-01-02 DIAGNOSIS — M2141 Flat foot [pes planus] (acquired), right foot: Secondary | ICD-10-CM | POA: Diagnosis not present

## 2020-01-02 DIAGNOSIS — M545 Low back pain, unspecified: Secondary | ICD-10-CM | POA: Diagnosis not present

## 2020-01-02 DIAGNOSIS — M2142 Flat foot [pes planus] (acquired), left foot: Secondary | ICD-10-CM | POA: Diagnosis not present

## 2020-01-02 NOTE — Patient Instructions (Signed)
Flat Feet, Adult  Normally, a foot has a curve, called an arch, on its inner side. The arch creates a gap between the foot and the ground. Flat feet is a common condition in which one or both feet do not have an arch. What are the causes? This condition may be caused by:  Failure of a normal arch to develop during childhood.  An injury to tendons and ligaments in the foot, such as to the tendon that supports the arch (posterior tibial tendon).  Loose tendons or ligaments in the foot.  A wearing down of the arch over time.  Injury to bones in the foot.  An abnormality in the bones of the foot, called tarsal coalition. This happens when two or more bones in the foot are joined together (fused) before birth. What increases the risk? This condition is more likely to develop in:  Females.  Adults age 40 or older.  People who: ? Have a family history of flat feet. ? Have a history of childhood flexible flatfoot. ? Are obese. ? Have diabetes. ? Have high blood pressure. ? Participate in high-impact sports. ? Have inflammatory arthritis. ? Have a history of broken (fractured) or dislocated bones in the foot. What are the signs or symptoms? Symptoms of this condition include:  Pain or tightness along the bottom of the foot.  Foot pain that gets worse with activity.  Swelling of the inner side of the foot.  Swelling of the ankle.  Pain on the outer side of the ankle.  Changes in the way that you walk (gait).  Pronation. This is when the foot and ankle lean inward when you are standing.  Bony bumps on the top or inner side of the foot. How is this diagnosed? This condition is diagnosed with a physical exam of your foot and ankle. Your health care provider may also:  Look at your shoes for patterns of wear on the soles.  Order imaging tests, such as X-rays, a CT scan, or an MRI.  Refer you to a health care provider who specializes in feet (podiatrist) or a physical  therapist. How is this treated? This condition may be treated with:  Stretching exercises or physical therapy. This helps to increase range of motion and relieve pain.  A shoe insert (orthotic). This helps to support the arch of your foot. Orthotics can be purchased from a store or can be custom-made by your health care provider.  Wearing shoes with appropriate arch support. This is especially important for athletes.  Medicines. These may be prescribed to relieve pain.  An ankle brace, boot, or cast. These may be used to relieve pressure on your foot. You may be given crutches if walking is painful.  Surgery. This may be done to improve the alignment of your foot. This is only needed if your posterior tibial tendon is torn or if you have tarsal coalition. Follow these instructions at home: Activity  Do any exercises as told by your health care provider.  If an activity causes pain, avoid it or try to find another activity that does not cause pain. General instructions  Wear orthotics and appropriate shoes as told by your health care provider.  Take over-the-counter and prescription medicines only as told by your health care provider.  Wear an ankle brace, boot, or cast as told by your health care provider.  Use crutches as told by your health care provider.  Keep all follow-up visits as told by your health care   provider. This is important. How is this prevented? To prevent the condition from getting worse:  Wear comfortable, supportive shoes that are appropriate for your activities.  Maintain a healthy weight.  Stay active in a way that your health care provider recommends. This will help to keep your feet flexible and strong.  Manage long-term (chronic) health conditions, such as diabetes, high blood pressure, and inflammatory arthritis.  Work with a health care provider if you have concerns about your feet or shoes. Contact a health care provider if:  You have pain in  your foot or lower leg that gets worse or does not improve with medicine.  You have pain or difficulty when walking.  You have problems with your orthotics. Summary  Flat feet is a common condition in which one or both feet do not have a curve, called an arch, on the inner side.  Your health care provider may recommend a shoe insert (orthotic) or shoes with the appropriate arch support.  Other treatments may include stretching exercises or physical therapy, medicines to relieve pain, and wearing an ankle brace, boot, or cast.  Surgery may be done if you have a tear in the tendon that supports your arch (posterior tibial tendon) or if two or more of your foot bones were joined together (fused)  before birth (tarsal coalition). This information is not intended to replace advice given to you by your health care provider. Make sure you discuss any questions you have with your health care provider. Document Revised: 05/20/2018 Document Reviewed: 04/09/2016 Elsevier Patient Education  2020 [employer]  Chronic Back Pain When back pain lasts longer than 3 months, it is called chronic back pain. Pain may get worse at certain times (flare-ups). There are things you can do at home to manage your pain. Follow these instructions at home: Activity      Avoid bending and other activities that make pain worse.  When standing: ? Keep your upper back and neck straight. ? Keep your shoulders pulled back. ? Avoid slouching.  When sitting: ? Keep your back straight. ? Relax your shoulders. Do not round your shoulders or pull them backward.  Do not sit or stand in one place for long periods of time.  Take short rest breaks during the day. Lying down or standing is usually better than sitting. Resting can help relieve pain.  When sitting or lying down for a long time, do some mild activity or stretching. This will help to prevent stiffness and pain.  Get regular exercise. Ask your doctor what  activities are safe for you.  Do not lift anything that is heavier than 10 lb (4.5 kg). To prevent injury when you lift things: ? Bend your knees. ? Keep the weight close to your body. ? Avoid twisting. Managing pain  If told, put ice on the painful area. Your doctor may tell you to use ice for 24-48 hours after a flare-up starts. ? Put ice in a plastic bag. ? Place a towel between your skin and the bag. ? Leave the ice on for 20 minutes, 2-3 times a day.  If told, put heat on the painful area as often as told by your doctor. Use the heat source that your doctor recommends, such as a moist heat pack or a heating pad. ? Place a towel between your skin and the heat source. ? Leave the heat on for 20-30 minutes. ? Remove the heat if your skin turns bright red. This is especially  important if you are unable to feel pain, heat, or cold. You may have a greater risk of getting burned.  Soak in a warm bath. This can help relieve pain.  Take over-the-counter and prescription medicines only as told by your doctor. General instructions  Sleep on a firm mattress. Try lying on your side with your knees slightly bent. If you lie on your back, put a pillow under your knees.  Keep all follow-up visits as told by your doctor. This is important. Contact a doctor if:  You have pain that does not get better with rest or medicine. Get help right away if:  One or both of your arms or legs feel weak.  One or both of your arms or legs lose feeling (numbness).  You have trouble controlling when you poop (bowel movement) or pee (urinate).  You feel sick to your stomach (nauseous).  You throw up (vomit).  You have belly (abdominal) pain.  You have shortness of breath.  You pass out (faint). Summary  When back pain lasts longer than 3 months, it is called chronic back pain.  Pain may get worse at certain times (flare-ups).  Use ice and heat as told by your doctor. Your doctor may tell you to  use ice after flare-ups. This information is not intended to replace advice given to you by your health care provider. Make sure you discuss any questions you have with your health care provider. Document Revised: 05/20/2018 Document Reviewed: 09/11/2016 Elsevier Patient Education  2020 [employer].

## 2020-01-02 NOTE — Progress Notes (Signed)
Virtual Visit via Video Note  I connected with Janet [name] on 01/02/20 at  1:00 PM EST by a video enabled telemedicine application 2/2 COVID-19 pandemic and verified that I am speaking with the correct person using two identifiers.  Location patient: home Location provider:work or home office Persons participating in the virtual visit: patient, provider  I discussed the limitations of evaluation and management by telemedicine and the availability of in person appointments. The patient expressed understanding and agreed to proceed.   HPI: Pt is a 44 year old female with past medical history significant for hypothyroidism, hypersomnia, vitamin D deficiency seen for ongoing concern.  Patient endorses low back pain since Sept with standing on concrete floor.  Using heating pad, Biofreeze, and tylenol for symptoms.  Also noticed low back pain with sitting in a high chair.  Pt does not recall injury.  States initially started when wearing flats at work.  Now wearing memory foam shoes that are 2 or more yrs old.  Patient endorses pain in medial left midfoot to ankle.  States at times feels like "the bone is rubbing" on shoe.  Patient states she has a history of flatfeet.   ROS: See pertinent positives and negatives per HPI.  Past Medical History:  Diagnosis Date  . HYPERSOMNIA 04/10/2010  . HYPERTHYROIDISM 08/03/2006  . Low vitamin D level 2019   level = 7    Past Surgical History:  Procedure Laterality Date  . CESAREAN SECTION     x3  . DILATION AND CURETTAGE OF UTERUS    . TUBAL LIGATION      Family History  Problem Relation Age of Onset  . Hypertension Mother   . Hyperlipidemia Mother   . Sleep apnea Mother   . Thyroid disease Mother   . Stroke Mother   . Cancer Other        Aunt with Cancer- unknown type  . Lupus Other        Uncle  . Cancer Other        Lung Cancer-Uncle     Current Outpatient Medications:  .  methimazole (TAPAZOLE) 5 MG tablet, Take 1 tablet (5 mg  total) by mouth daily., Disp: 90 tablet, Rfl: 3 .  metroNIDAZOLE (FLAGYL) 500 MG tablet, Take 1 tablet (500 mg total) by mouth 2 (two) times daily., Disp: 14 tablet, Rfl: 0 .  valACYclovir (VALTREX) 500 MG tablet, Take 1 tablet (500 mg total) by mouth 2 (two) times daily. Take one tablet BID at onset of symptoms for 3 days., Disp: 30 tablet, Rfl: 3  EXAM:  VITALS per patient if applicable: RR between 12-20 bpm   GENERAL: alert, oriented, appears well and in no acute distress  HEENT: atraumatic, conjunctiva clear, no obvious abnormalities on inspection of external nose and ears  NECK: normal movements of the head and neck  LUNGS: on inspection no signs of respiratory distress, breathing rate appears normal, no obvious gross SOB, gasping or wheezing  CV: no obvious cyanosis  MS: moves all visible extremities without noticeable abnormality  PSYCH/NEURO: pleasant and cooperative, no obvious depression or anxiety, speech and thought processing grossly intact  ASSESSMENT AND PLAN:  Discussed the following assessment and plan:  Left foot pain  -likely 2/2 h/o flat feet and prolonged standing on concrete floor -Discussed the importance of properly fitting supportive shoes -Discussed supportive care -Consider orthotics - Plan: Ambulatory referral to Podiatry  Flat feet  -Discussed wearing supportive shoes.  Consider orthotics - Plan: Ambulatory referral to Podiatry  Chronic bilateral low back pain without sciatica -Likely 2/2 misalignment of hips due to foot pain and standing on concrete floor -Discussed supportive care including stretching, heat, ice, massage, NSAIDs, topical analgesics -Discussed orthotics -Consider PT for continued or worsened symptoms after foot pain evaluated and treated   Follow-up as needed in the next few weeks  I discussed the assessment and treatment plan with the patient. The patient was provided an opportunity to ask questions and all were answered.  The patient agreed with the plan and demonstrated an understanding of the instructions.   The patient was advised to call back or seek an in-person evaluation if the symptoms worsen or if the condition fails to improve as anticipated.   Deeann Saint, MD

## 2020-01-31 ENCOUNTER — Ambulatory Visit: Payer: Self-pay | Admitting: Podiatry

## 2020-02-06 ENCOUNTER — Other Ambulatory Visit: Payer: Self-pay

## 2020-02-06 ENCOUNTER — Ambulatory Visit (INDEPENDENT_AMBULATORY_CARE_PROVIDER_SITE_OTHER): Payer: PRIVATE HEALTH INSURANCE

## 2020-02-06 ENCOUNTER — Encounter: Payer: Self-pay | Admitting: Podiatry

## 2020-02-06 ENCOUNTER — Ambulatory Visit (INDEPENDENT_AMBULATORY_CARE_PROVIDER_SITE_OTHER): Payer: PRIVATE HEALTH INSURANCE | Admitting: Podiatry

## 2020-02-06 DIAGNOSIS — M2141 Flat foot [pes planus] (acquired), right foot: Secondary | ICD-10-CM

## 2020-02-06 DIAGNOSIS — M7750 Other enthesopathy of unspecified foot: Secondary | ICD-10-CM | POA: Diagnosis not present

## 2020-02-06 DIAGNOSIS — M76829 Posterior tibial tendinitis, unspecified leg: Secondary | ICD-10-CM | POA: Diagnosis not present

## 2020-02-06 DIAGNOSIS — M2142 Flat foot [pes planus] (acquired), left foot: Secondary | ICD-10-CM | POA: Diagnosis not present

## 2020-02-06 DIAGNOSIS — M7752 Other enthesopathy of left foot: Secondary | ICD-10-CM | POA: Diagnosis not present

## 2020-02-06 MED ORDER — MELOXICAM 15 MG PO TABS: 15.0000 mg | ORAL_TABLET | Freq: Every day | ORAL | 0 refills | Status: AC

## 2020-02-06 MED FILL — MELOXICAM 15 MG TABLET: 15 | 30 days supply | Qty: 30 | Fill #0

## 2020-02-06 NOTE — Progress Notes (Signed)
Subjective:   Patient ID: Janet [name], female   DOB: 44 y.o.   MRN: [id number]   HPI 44 year old female presents the office today for concerns of left foot, ankle pain.  She points on the medial aspect of her foot which is majority discomfort and she points that it goes up the side of her ankle at times as well.  She points on the medial aspect.  This is been ongoing for last 1 year but has been getting worse the last couple of months.  She states when she stands on concrete it hurts.  Hurts more for shoes and has prolonged standing.  No significant pain today.  Occasional swelling.  No recent injury or treatment.  No other concerns.  She does get some low back pain that she thinks may be coming from her feet.   Review of Systems  All other systems reviewed and are negative.   Past Medical History:  Diagnosis Date  . HYPERSOMNIA 04/10/2010  . HYPERTHYROIDISM 08/03/2006  . Low vitamin D level 2019   level = 7    Past Surgical History:  Procedure Laterality Date  . CESAREAN SECTION     x3  . DILATION AND CURETTAGE OF UTERUS    . TUBAL LIGATION       Current Outpatient Medications:  .  meloxicam (MOBIC) 15 MG tablet, Take 1 tablet (15 mg total) by mouth daily., Disp: 30 tablet, Rfl: 0 .  methimazole (TAPAZOLE) 5 MG tablet, Take 1 tablet (5 mg total) by mouth daily., Disp: 90 tablet, Rfl: 3 .  metroNIDAZOLE (FLAGYL) 500 MG tablet, Take 1 tablet (500 mg total) by mouth 2 (two) times daily., Disp: 14 tablet, Rfl: 0 .  valACYclovir (VALTREX) 500 MG tablet, Take 1 tablet (500 mg total) by mouth 2 (two) times daily. Take one tablet BID at onset of symptoms for 3 days., Disp: 30 tablet, Rfl: 3  Allergies  Allergen Reactions  . Sulfa Antibiotics Shortness Of Breath and Swelling         Objective:  Physical Exam  General: AAO x3, NAD  Dermatological:  There are no open sores, no preulcerative lesions, no rash or signs of infection present.  Vascular: Dorsalis Pedis artery  and Posterior Tibial artery pedal pulses are 2/4 bilateral with immedate capillary fill time. Pedal hair growth present. No varicosities and no lower extremity edema present bilateral. There is no pain with calf compression, swelling, warmth, erythema.   Neruologic: Grossly intact via light touch bilateral.Negative tinel sign  Musculoskeletal: There is a decrease in medial arch upon weightbearing.  There is significant tenderness to palpation to bilateral lower extremities today but subjectively there is tenderness on the course of the posterior tibial tendon posterior tibial malleolus curving towards the navicular tuberosity on the left side.  There is mild edema to this area, to the contralateral extremity.  MMT 5/5.  She is able to do a single and double heel rise without pain.  Muscular strength 5/5 in all groups tested bilateral.  Gait: Unassisted, Nonantalgic.       Assessment:   Left PTTD, flatfoot     Plan:  -Treatment options discussed including all alternatives, risks, and complications -Etiology of symptoms were discussed -X-rays were obtained and reviewed with the patient.  No evidence of acute fracture or stress fracture identified today. -Prescribed mobic. Discussed side effects of the medication and directed to stop if any are to occur and call the office.  -Discussed inserts.  After discussion she  wants to start with over-the-counter inserts.  She has purchased power steps online.  If this not helpful will consider custom ones.  She is also interested to see if her back pain, low back pain improves with orthotics.    Vivi Barrack DPM

## 2020-02-06 NOTE — Patient Instructions (Signed)
Posterior Tibial Tendinitis Posterior tibial tendinitis is irritation of a tendon called the posterior tibial tendon. Your posterior tibial tendon is a cord-like tissue that connects bones of your lower leg and foot to a muscle that:  Supports your arch.  Helps you raise up on your toes.  Helps you turn your foot down and in. This condition causes foot and ankle pain. It can also lead to a flat foot. What are the causes? This condition is most often caused by repeated stress to the tendon (overuse injury). It can also be caused by a sudden injury that stresses the tendon, such as landing on your foot after jumping or falling. What increases the risk? This condition is more likely to develop in:  People who play a sport that involves putting a lot of pressure on the feet, such as: ? Basketball. ? Tennis. ? Soccer. ? Hockey.  Runners.  Females who are older than 42 years of age and are overweight.  People with diabetes.  People with decreased foot stability.  People with flat feet. What are the signs or symptoms? Symptoms include:  Pain in the inner ankle.  Pain at the arch of your foot.  Pain that gets worse with running, walking, or standing.  Swelling on the inside of your ankle and foot.  Weakness in your ankle or foot.  Inability to stand up on tiptoe.  Flattening of the arch of your foot. How is this diagnosed? This condition may be diagnosed based on:  Your symptoms.  Your medical history.  A physical exam.  Tests, such as: ? X-ray. ? MRI. ? Ultrasound. How is this treated? This condition may be treated by:  Putting ice to the injured area.  Taking NSAIDs, such as ibuprofen, to reduce pain and swelling.  Wearing a special shoe or shoe insert to support your arch (orthotic).  Having physical therapy.  Replacing high-impact exercise with low-impact exercise, such as swimming or cycling. If your symptoms do not improve with these treatments, you  may need to wear a splint, removable walking boot, or short leg cast for 6-8 weeks to keep your foot and ankle still (immobilized). Follow these instructions at home: If you have a cast, splint, or boot:  Keep it clean and dry.  Check the skin around it every day. Tell your health care provider about any concerns. If you have a cast:  Do not stick anything inside it to scratch your skin. Doing that increases your risk of infection.  You may put lotion on dry skin around the edges of the cast. Do not put lotion on the skin underneath the cast. If you have a splint or boot:  Wear it as told by your health care provider. Remove it only as told by your health care provider.  Loosen it if your toes tingle, become numb, or turn cold and blue. Bathing  Do not take baths, swim, or use a hot tub until your health care provider approves. Ask your health care provider if you may take showers.  If your cast, splint, or boot is not waterproof: ? Do not let it get wet. ? Cover it with a waterproof covering while you take a bath or a shower. Managing pain and swelling   If directed, put ice on the injured area. ? If you have a removable splint or boot, remove it as told by your health care provider. ? Put ice in a plastic bag. ? Place a towel between your skin and   the bag or between your cast and the bag. ? Leave the ice on for 20 minutes, 2-3 times a day.  Move your toes often to reduce stiffness and swelling.  Raise (elevate) the injured area above the level of your heart while you are sitting or lying down. Activity  Do not use the injured foot to support your body weight until your health care provider says that you can. Use crutches as told by your health care provider.  Do not do activities that make pain or swelling worse.  Ask your health care provider when it is safe to drive if you have a cast, splint, or boot on your foot.  Return to your normal activities as told by your  health care provider. Ask your health care provider what activities are safe for you.  Do exercises as told by your health care provider. General instructions  Take over-the-counter and prescription medicines only as told by your health care provider.  If you have an orthotic, use it as told by your health care provider.  Keep all follow-up visits as told by your health care provider. This is important. How is this prevented?  Wear footwear that is appropriate to your athletic activity.  Avoid athletic activities that cause pain or swelling in your ankle or foot.  Before being active, do range-of-motion and stretching exercises.  If you develop pain or swelling while training, stop training.  If you have pain or swelling that does not improve after a few days of rest, see your health care provider.  If you start a new athletic activity, start gradually so you can build up your strength and flexibility. Contact a health care provider if:  Your symptoms get worse.  Your symptoms do not improve in 6-8 weeks.  You develop new, unexplained symptoms.  Your splint, boot, or cast gets damaged. Summary  Posterior tibial tendinitis is irritation of a tendon called the posterior tibial tendon.  This condition is most often caused by repeated stress to the tendon (overuse injury).  This condition causes foot pain and ankle pain. It can also lead to a flat foot.  This condition may be treated by not doing high-impact activities, applying ice, having physical therapy, wearing orthotics, and wearing a cast, splint, or boot if needed. This information is not intended to replace advice given to you by your health care provider. Make sure you discuss any questions you have with your health care provider. Document Revised: 05/25/2018 Document Reviewed: 04/01/2018 Elsevier Patient Education  2020 [employer]  

## 2020-02-15 ENCOUNTER — Encounter: Payer: Self-pay | Admitting: Family Medicine

## 2020-03-26 ENCOUNTER — Ambulatory Visit: Payer: PRIVATE HEALTH INSURANCE | Admitting: Podiatry

## 2020-03-29 ENCOUNTER — Encounter: Payer: Self-pay | Admitting: Endocrinology

## 2020-03-29 DIAGNOSIS — E0500 Thyrotoxicosis with diffuse goiter without thyrotoxic crisis or storm: Secondary | ICD-10-CM

## 2020-03-29 MED ORDER — METHIMAZOLE 5 MG PO TABS: 5.0000 mg | ORAL_TABLET | Freq: Every day | ORAL | 0 refills | Status: DC

## 2020-04-13 ENCOUNTER — Ambulatory Visit: Payer: 59 | Admitting: Endocrinology

## 2020-05-11 ENCOUNTER — Other Ambulatory Visit: Payer: Self-pay | Admitting: Family Medicine

## 2020-05-11 DIAGNOSIS — Z1231 Encounter for screening mammogram for malignant neoplasm of breast: Secondary | ICD-10-CM

## 2020-05-19 ENCOUNTER — Ambulatory Visit
Admission: RE | Admit: 2020-05-19 | Discharge: 2020-05-19 | Disposition: A | Discharge status: Home / Self Care | Payer: No Typology Code available for payment source | Source: Ambulatory Visit

## 2020-05-19 ENCOUNTER — Other Ambulatory Visit: Payer: Self-pay

## 2020-05-19 DIAGNOSIS — Z1231 Encounter for screening mammogram for malignant neoplasm of breast: Secondary | ICD-10-CM

## 2020-06-14 ENCOUNTER — Other Ambulatory Visit: Payer: Self-pay

## 2020-06-14 ENCOUNTER — Ambulatory Visit (INDEPENDENT_AMBULATORY_CARE_PROVIDER_SITE_OTHER): Payer: PRIVATE HEALTH INSURANCE

## 2020-06-14 ENCOUNTER — Ambulatory Visit (INDEPENDENT_AMBULATORY_CARE_PROVIDER_SITE_OTHER): Payer: PRIVATE HEALTH INSURANCE | Admitting: Family Medicine

## 2020-06-14 ENCOUNTER — Encounter: Payer: Self-pay | Admitting: Family Medicine

## 2020-06-14 VITALS — BP 108/68 | HR 93 | Temp 98.2°F | Wt 193.0 lb

## 2020-06-14 DIAGNOSIS — K5900 Constipation, unspecified: Secondary | ICD-10-CM

## 2020-06-14 DIAGNOSIS — G8929 Other chronic pain: Secondary | ICD-10-CM

## 2020-06-14 DIAGNOSIS — M545 Low back pain, unspecified: Secondary | ICD-10-CM

## 2020-06-14 LAB — COMPREHENSIVE METABOLIC PANEL
ALT: 24 U/L (ref 0–35)
AST: 13 U/L (ref 0–37)
Albumin: 4 g/dL (ref 3.5–5.2)
Alkaline Phosphatase: 66 U/L (ref 39–117)
BUN: 11 mg/dL (ref 6–23)
CO2: 24 mEq/L (ref 19–32)
Calcium: 8.7 mg/dL (ref 8.4–10.5)
Chloride: 104 mEq/L (ref 96–112)
Creatinine, Ser: 0.7 mg/dL (ref 0.40–1.20)
GFR: 106.07 mL/min (ref >60.00)
Glucose, Bld: 82 mg/dL (ref 70–99)
Potassium: 3.9 mEq/L (ref 3.5–5.1)
Sodium: 137 mEq/L (ref 135–145)
Total Bilirubin: 0.3 mg/dL (ref 0.2–1.2)
Total Protein: 7 g/dL (ref 6.0–8.3)

## 2020-06-14 LAB — CBC WITH DIFFERENTIAL/PLATELET
Basophils Absolute: 0 10*3/uL (ref 0.0–0.1)
Basophils Relative: 0.5 % (ref 0.0–3.0)
Eosinophils Absolute: 0.4 10*3/uL (ref 0.0–0.7)
Eosinophils Relative: 7.4 % — ABNORMAL HIGH (ref 0.0–5.0)
HCT: 37.8 % (ref 36.0–46.0)
Hemoglobin: 12.9 g/dL (ref 12.0–15.0)
Lymphocytes Relative: 30.9 % (ref 12.0–46.0)
Lymphs Abs: 1.8 10*3/uL (ref 0.7–4.0)
MCHC: 34.1 g/dL (ref 30.0–36.0)
MCV: 85.4 fl (ref 78.0–100.0)
Monocytes Absolute: 0.4 10*3/uL (ref 0.1–1.0)
Monocytes Relative: 7.4 % (ref 3.0–12.0)
Neutro Abs: 3.2 10*3/uL (ref 1.4–7.7)
Neutrophils Relative %: 53.8 % (ref 43.0–77.0)
Platelets: 354 10*3/uL (ref 150.0–400.0)
RBC: 4.42 Mil/uL (ref 3.87–5.11)
RDW: 14.4 % (ref 11.5–15.5)
WBC: 5.9 10*3/uL (ref 4.0–10.5)

## 2020-06-14 LAB — TSH: TSH: 1.75 u[IU]/mL (ref 0.35–4.50)

## 2020-06-14 LAB — LIPASE: Lipase: 84 U/L — ABNORMAL HIGH (ref 11.0–59.0)

## 2020-06-14 LAB — HEMOGLOBIN A1C: Hgb A1c MFr Bld: 6 % (ref 4.6–6.5)

## 2020-06-14 NOTE — Patient Instructions (Signed)
Constipation, Adult Constipation is when a person has fewer than three bowel movements in a week, has difficulty having a bowel movement, or has stools (feces) that are dry, hard, or larger than normal. Constipation may be caused by an underlying condition. It may become worse with age if a person takes certain medicines and does not take in enough fluids. Follow these instructions at home: Eating and drinking  Eat foods that have a lot of fiber, such as beans, whole grains, and fresh fruits and vegetables.  Limit foods that are low in fiber and high in fat and processed sugars, such as fried or sweet foods. These include french fries, hamburgers, cookies, candies, and soda.  Drink enough fluid to keep your urine pale yellow.   General instructions  Exercise regularly or as told by your health care provider. Try to do 150 minutes of moderate exercise each week.  Use the bathroom when you have the urge to go. Do not hold it in.  Take over-the-counter and prescription medicines only as told by your health care provider. This includes any fiber supplements.  During bowel movements: ? Practice deep breathing while relaxing the lower abdomen. ? Practice pelvic floor relaxation.  Watch your condition for any changes. Let your health care provider know about them.  Keep all follow-up visits as told by your health care provider. This is important. Contact a health care provider if:  You have pain that gets worse.  You have a fever.  You do not have a bowel movement after 4 days.  You vomit.  You are not hungry or you lose weight.  You are bleeding from the opening between the buttocks (anus).  You have thin, pencil-like stools. Get help right away if:  You have a fever and your symptoms suddenly get worse.  You leak stool or have blood in your stool.  Your abdomen is bloated.  You have severe pain in your abdomen.  You feel dizzy or you faint. Summary  Constipation is  when a person has fewer than three bowel movements in a week, has difficulty having a bowel movement, or has stools (feces) that are dry, hard, or larger than normal.  Eat foods that have a lot of fiber, such as beans, whole grains, and fresh fruits and vegetables.  Drink enough fluid to keep your urine pale yellow.  Take over-the-counter and prescription medicines only as told by your health care provider. This includes any fiber supplements. This information is not intended to replace advice given to you by your health care provider. Make sure you discuss any questions you have with your health care provider. Document Revised: 12/15/2018 Document Reviewed: 12/15/2018 Elsevier Patient Education  2021 Elsevier Inc.  https://doi.[email or web address]/[email or web address]">  Chronic Back Pain When back pain lasts longer than 3 months, it is called chronic back pain. The cause of your back pain may not be known. Some common causes include:  Wear and tear (degenerative disease) of the bones, ligaments, or disks in your back.  Inflammation and stiffness in your back (arthritis). People who have chronic back pain often go through certain periods in which the pain is more intense (flare-ups). Many people can learn to manage the pain with home care. Follow these instructions at home: Pay attention to any changes in your symptoms. Take these actions to help with your pain: Managing pain and stiffness  If directed, apply ice to the painful area. Your health care provider may recommend applying ice during the first 24-48 hours after  a flare-up begins. To do this: ? Put ice in a plastic bag. ? Place a towel between your skin and the bag. ? Leave the ice on for 20 minutes, 2-3 times per day.  If directed, apply heat to the affected area as often as told by your health care provider. Use the heat source that your health care provider recommends, such as a moist heat pack or a heating pad. ? Place a towel between  your skin and the heat source. ? Leave the heat on for 20-30 minutes. ? Remove the heat if your skin turns bright red. This is especially important if you are unable to feel pain, heat, or cold. You may have a greater risk of getting burned.  Try soaking in a warm tub.      Activity  Avoid bending and other activities that make the problem worse.  Maintain a proper position when standing or sitting: ? When standing, keep your upper back and neck straight, with your shoulders pulled back. Avoid slouching. ? When sitting, keep your back straight and relax your shoulders. Do not round your shoulders or pull them backward.  Do not sit or stand in one place for long periods of time.  Take brief periods of rest throughout the day. This will reduce your pain. Resting in a lying or standing position is usually better than sitting to rest.  When you are resting for longer periods, mix in some mild activity or stretching between periods of rest. This will help to prevent stiffness and pain.  Get regular exercise. Ask your health care provider what activities are safe for you.  Do not lift anything that is heavier than 10 lb (4.5 kg), or the limit that you are told, until your health care provider says that it is safe. Always use proper lifting technique, which includes: ? Bending your knees. ? Keeping the load close to your body. ? Avoiding twisting.  Sleep on a firm mattress in a comfortable position. Try lying on your side with your knees slightly bent. If you lie on your back, put a pillow under your knees.   Medicines  Treatment may include medicines for pain and inflammation taken by mouth or applied to the skin, prescription pain medicine, or muscle relaxants. Take over-the-counter and prescription medicines only as told by your health care provider.  Ask your health care provider if the medicine prescribed to you: ? Requires you to avoid driving or using machinery. ? Can cause  constipation. You may need to take these actions to prevent or treat constipation:  Drink enough fluid to keep your urine pale yellow.  Take over-the-counter or prescription medicines.  Eat foods that are high in fiber, such as beans, whole grains, and fresh fruits and vegetables.  Limit foods that are high in fat and processed sugars, such as fried or sweet foods. General instructions  Do not use any products that contain nicotine or tobacco, such as cigarettes, e-cigarettes, and chewing tobacco. If you need help quitting, ask your health care provider.  Keep all follow-up visits as told by your health care provider. This is important. Contact a health care provider if:  You have pain that is not relieved with rest or medicine.  Your pain gets worse, or you have new pain.  You have a high fever.  You have rapid weight loss.  You have trouble doing your normal activities. Get help right away if:  You have weakness or numbness in one or both  of your legs or feet.  You have trouble controlling your bladder or your bowels.  You have severe back pain and have any of the following: ? Nausea or vomiting. ? Pain in your abdomen. ? Shortness of breath or you faint. Summary  Chronic back pain is back pain that lasts longer than 3 months.  When a flare-up begins, apply ice to the painful area for the first 24-48 hours.  Apply a moist heat pad or use a heating pad on the painful area as directed by your health care provider.  When you are resting for longer periods, mix in some mild activity or stretching between periods of rest. This will help to prevent stiffness and pain. This information is not intended to replace advice given to you by your health care provider. Make sure you discuss any questions you have with your health care provider. Document Revised: 03/09/2019 Document Reviewed: 03/09/2019 Elsevier Patient Education  2021 [employer] Inc.  Low Back Sprain or Strain  Rehab Ask your health care provider which exercises are safe for you. Do exercises exactly as told by your health care provider and adjust them as directed. It is normal to feel mild stretching, pulling, tightness, or discomfort as you do these exercises. Stop right away if you feel sudden pain or your pain gets worse. Do not begin these exercises until told by your health care provider. Stretching and range-of-motion exercises These exercises warm up your muscles and joints and improve the movement and flexibility of your back. These exercises also help to relieve pain, numbness, and tingling. Lumbar rotation 1. Lie on your back on a firm surface and bend your knees. 2. Straighten your arms out to your sides so each arm forms a 90-degree angle (right angle) with a side of your body. 3. Slowly move (rotate) both of your knees to one side of your body until you feel a stretch in your lower back (lumbar). Try not to let your shoulders lift off the floor. 4. Hold this position for __________ seconds. 5. Tense your abdominal muscles and slowly move your knees back to the starting position. 6. Repeat this exercise on the other side of your body. Repeat __________ times. Complete this exercise __________ times a day.   Single knee to chest 1. Lie on your back on a firm surface with both legs straight. 2. Bend one of your knees. Use your hands to move your knee up toward your chest until you feel a gentle stretch in your lower back and buttock. ? Hold your leg in this position by holding on to the front of your knee. ? Keep your other leg as straight as possible. 3. Hold this position for __________ seconds. 4. Slowly return to the starting position. 5. Repeat with your other leg. Repeat __________ times. Complete this exercise __________ times a day.   Prone extension on elbows 1. Lie on your abdomen on a firm surface (prone position). 2. Prop yourself up on your elbows. 3. Use your arms to help  lift your chest up until you feel a gentle stretch in your abdomen and your lower back. ? This will place some of your body weight on your elbows. If this is uncomfortable, try stacking pillows under your chest. ? Your hips should stay down, against the surface that you are lying on. Keep your hip and back muscles relaxed. 4. Hold this position for __________ seconds. 5. Slowly relax your upper body and return to the starting position. Repeat __________  times. Complete this exercise __________ times a day.   Strengthening exercises These exercises build strength and endurance in your back. Endurance is the ability to use your muscles for a long time, even after they get tired. Pelvic tilt This exercise strengthens the muscles that lie deep in the abdomen. 1. Lie on your back on a firm surface. Bend your knees and keep your feet flat on the floor. 2. Tense your abdominal muscles. Tip your pelvis up toward the ceiling and flatten your lower back into the floor. ? To help with this exercise, you may place a small towel under your lower back and try to push your back into the towel. 3. Hold this position for __________ seconds. 4. Let your muscles relax completely before you repeat this exercise. Repeat __________ times. Complete this exercise __________ times a day. Alternating arm and leg raises 1. Get on your hands and knees on a firm surface. If you are on a hard floor, you may want to use padding, such as an exercise mat, to cushion your knees. 2. Line up your arms and legs. Your hands should be directly below your shoulders, and your knees should be directly below your hips. 3. Lift your left leg behind you. At the same time, raise your right arm and straighten it in front of you. ? Do not lift your leg higher than your hip. ? Do not lift your arm higher than your shoulder. ? Keep your abdominal and back muscles tight. ? Keep your hips facing the ground. ? Do not arch your back. ? Keep  your balance carefully, and do not hold your breath. 4. Hold this position for __________ seconds. 5. Slowly return to the starting position. 6. Repeat with your right leg and your left arm. Repeat __________ times. Complete this exercise __________ times a day.   Abdominal set with straight leg raise 1. Lie on your back on a firm surface. 2. Bend one of your knees and keep your other leg straight. 3. Tense your abdominal muscles and lift your straight leg up, 4-6 inches (10-15 cm) off the ground. 4. Keep your abdominal muscles tight and hold this position for __________ seconds. ? Do not hold your breath. ? Do not arch your back. Keep it flat against the ground. 5. Keep your abdominal muscles tense as you slowly lower your leg back to the starting position. 6. Repeat with your other leg. Repeat __________ times. Complete this exercise __________ times a day.   Single leg lower with bent knees 1. Lie on your back on a firm surface. 2. Tense your abdominal muscles and lift your feet off the floor, one foot at a time, so your knees and hips are bent in 90-degree angles (right angles). ? Your knees should be over your hips and your lower legs should be parallel to the floor. 3. Keeping your abdominal muscles tense and your knee bent, slowly lower one of your legs so your toe touches the ground. 4. Lift your leg back up to return to the starting position. ? Do not hold your breath. ? Do not let your back arch. Keep your back flat against the ground. 5. Repeat with your other leg. Repeat __________ times. Complete this exercise __________ times a day. Posture and body mechanics Good posture and healthy body mechanics can help to relieve stress in your body's tissues and joints. Body mechanics refers to the movements and positions of your body while you do your daily activities. Posture is  part of body mechanics. Good posture means:  Your spine is in its natural S-curve position  (neutral).  Your shoulders are pulled back slightly.  Your head is not tipped forward. Follow these guidelines to improve your posture and body mechanics in your everyday activities. Standing  When standing, keep your spine neutral and your feet about hip width apart. Keep a slight bend in your knees. Your ears, shoulders, and hips should line up.  When you do a task in which you stand in one place for a long time, place one foot up on a stable object that is 2-4 inches (5-10 cm) high, such as a footstool. This helps keep your spine neutral.   Sitting  When sitting, keep your spine neutral and keep your feet flat on the floor. Use a footrest, if necessary, and keep your thighs parallel to the floor. Avoid rounding your shoulders, and avoid tilting your head forward.  When working at a desk or a computer, keep your desk at a height where your hands are slightly lower than your elbows. Slide your chair under your desk so you are close enough to maintain good posture.  When working at a computer, place your monitor at a height where you are looking straight ahead and you do not have to tilt your head forward or downward to look at the screen.   Resting  When lying down and resting, avoid positions that are most painful for you.  If you have pain with activities such as sitting, bending, stooping, or squatting, lie in a position in which your body does not bend very much. For example, avoid curling up on your side with your arms and knees near your chest (fetal position).  If you have pain with activities such as standing for a long time or reaching with your arms, lie with your spine in a neutral position and bend your knees slightly. Try the following positions: ? Lying on your side with a pillow between your knees. ? Lying on your back with a pillow under your knees. Lifting  When lifting objects, keep your feet at least shoulder width apart and tighten your abdominal muscles.  Bend  your knees and hips and keep your spine neutral. It is important to lift using the strength of your legs, not your back. Do not lock your knees straight out.  Always ask for help to lift heavy or awkward objects.   This information is not intended to replace advice given to you by your health care provider. Make sure you discuss any questions you have with your health care provider. Document Revised: 05/21/2018 Document Reviewed: 02/18/2018 Elsevier Patient Education  2021 [employer].

## 2020-06-14 NOTE — Progress Notes (Signed)
Subjective:    Patient ID: Janet [name], female    DOB: 23-Aug-1975, 45 y.o.   MRN: [id number]  Chief Complaint  Patient presents with  . Back Pain    Throbbing pain, lower back pain since last September     HPI Patient was seen today for ongoing concern.  Patient endorses continued midline low back pain since September 2021.  Patient started wearing insoles in shoes which helped with pain.  Standing on concrete floors at work.  Patient tried heat for her back pain which helps.  States pain is intermittent at times causing difficulty sitting and bending over.  Patient denies pain that radiates, moves down legs, loss of bowel or bladder.  Biofreeze no longer working.  Patient also endorses abdominal pain and constipation.  Trying to increase po intake of fiber.  Drinking at least 32 oz of water per day.  Past Medical History:  Diagnosis Date  . HYPERSOMNIA 04/10/2010  . HYPERTHYROIDISM 08/03/2006  . Low vitamin D level 2019   level = 7    Allergies  Allergen Reactions  . Sulfa Antibiotics Shortness Of Breath and Swelling    ROS General: Denies fever, chills, night sweats, changes in weight, changes in appetite HEENT: Denies headaches, ear pain, changes in vision, rhinorrhea, sore throat CV: Denies CP, palpitations, SOB, orthopnea Pulm: Denies SOB, cough, wheezing GI: Denies nausea, vomiting, diarrhea   +abdominal pain,constipation, gas GU: Denies dysuria, hematuria, frequency, vaginal discharge   Msk: Denies muscle cramps, joint pains  +chronic midline low back pain Neuro: Denies weakness, numbness, tingling Skin: Denies rashes, bruising Psych: Denies depression, anxiety, hallucinations     Objective:    Blood pressure 108/68, pulse 93, temperature 98.2 F (36.8 C), temperature source Oral, weight 193 lb (87.5 kg), SpO2 99 %.   Gen. Pleasant, well-nourished, in no distress, normal affect   HEENT: [place]/AT, face symmetric, conjunctiva clear, no scleral icterus, PERRLA, EOMI,  nares patent without drainage Lungs: no accessory muscle use, CTAB, no wheezes or rales Cardiovascular: RRR, no m/r/g, noperipheral edema Abdomen: BS present, soft, ND, TTP in LUQ and suprapubic area.  No hepatosplenomegaly. Musculoskeletal: TTP of midline lumbar and sacral spine.  No TTP of cervical, thoracic, or paraspinal muscles.  Negative straight leg raise, logroll, FADIR and FABER bilaterally.  No deformities, no cyanosis or clubbing, normal tone Neuro:  A&Ox3, CN II-XII intact, normal gait Skin:  Warm, no lesions/ rash   Wt Readings from Last 3 Encounters:  06/14/20 193 lb (87.5 kg)  10/07/19 191 lb (86.6 kg)  09/09/19 193 lb (87.5 kg)    Lab Results  Component Value Date   WBC 5.3 09/09/2019   HGB 13.6 09/09/2019   HCT 39.8 09/09/2019   PLT 401 (H) 09/09/2019   GLUCOSE 103 (H) 09/09/2019   CHOL 149 09/16/2019   TRIG 64 09/16/2019   HDL 54 09/16/2019   LDLCALC 81 09/16/2019   ALT 15 09/09/2019   AST 14 09/09/2019   NA 136 09/09/2019   K 4.0 09/09/2019   CL 105 09/09/2019   CREATININE 0.74 09/09/2019   BUN 11 09/09/2019   CO2 24 09/09/2019   TSH 1.39 09/09/2019   HGBA1C 5.7 (H) 09/09/2019    Assessment/Plan:  Chronic midline low back pain without sciatica -Discussed possible causes including arthritis, disc herniation -Continue supportive care including wearing supportive shoes, heat, NSAIDs, topical analgesics -Discussed ergonomic workspace changes and ways to improve posture. -Physical therapy recommended. -We will obtain imaging - Plan: DG Lumbar Spine Complete  Constipation, unspecified constipation type  -Discussed diet modifications and increasing p.o. intake of water -Discussed MiraLAX twice daily then decreasing as movements happen. -Given handout -We will obtain labs - Plan: CBC with Differential/Platelet, CMP, Lipase, Hemoglobin A1c, TSH  F/u as needed  Abbe Amsterdam, MD

## 2020-06-18 NOTE — Progress Notes (Signed)
Patient viewed results on MyChart. 

## 2020-06-19 NOTE — Progress Notes (Signed)
Patient viewed results on MyChart. 

## 2020-07-09 ENCOUNTER — Other Ambulatory Visit: Payer: Self-pay | Admitting: Endocrinology

## 2020-07-09 DIAGNOSIS — E0500 Thyrotoxicosis with diffuse goiter without thyrotoxic crisis or storm: Secondary | ICD-10-CM

## 2020-08-26 LAB — HM PAP SMEAR

## 2020-09-24 ENCOUNTER — Encounter: Payer: Self-pay | Admitting: Family Medicine

## 2020-09-28 MED ORDER — VALACYCLOVIR HCL 500 MG PO TABS: 500.0000 mg | ORAL_TABLET | Freq: Two times a day (BID) | ORAL | 3 refills | Status: DC

## 2020-10-09 ENCOUNTER — Other Ambulatory Visit: Payer: Self-pay | Admitting: Endocrinology

## 2020-10-09 DIAGNOSIS — E0500 Thyrotoxicosis with diffuse goiter without thyrotoxic crisis or storm: Secondary | ICD-10-CM

## 2020-10-11 ENCOUNTER — Other Ambulatory Visit: Payer: Self-pay | Admitting: Family Medicine

## 2020-11-09 ENCOUNTER — Ambulatory Visit: Payer: PRIVATE HEALTH INSURANCE | Admitting: Endocrinology

## 2020-12-20 ENCOUNTER — Ambulatory Visit (INDEPENDENT_AMBULATORY_CARE_PROVIDER_SITE_OTHER): Payer: No Typology Code available for payment source | Admitting: Endocrinology

## 2020-12-20 ENCOUNTER — Other Ambulatory Visit: Payer: Self-pay

## 2020-12-20 ENCOUNTER — Encounter: Payer: Self-pay | Admitting: Endocrinology

## 2020-12-20 VITALS — BP 120/60 | HR 78 | Ht 66.25 in | Wt 192.8 lb

## 2020-12-20 DIAGNOSIS — E0590 Thyrotoxicosis, unspecified without thyrotoxic crisis or storm: Secondary | ICD-10-CM | POA: Diagnosis not present

## 2020-12-20 DIAGNOSIS — R739 Hyperglycemia, unspecified: Secondary | ICD-10-CM | POA: Insufficient documentation

## 2020-12-20 DIAGNOSIS — E785 Hyperlipidemia, unspecified: Secondary | ICD-10-CM | POA: Diagnosis not present

## 2020-12-20 LAB — BASIC METABOLIC PANEL
BUN: 14 mg/dL (ref 6–23)
CO2: 26 mEq/L (ref 19–32)
Calcium: 9.1 mg/dL (ref 8.4–10.5)
Chloride: 103 mEq/L (ref 96–112)
Creatinine, Ser: 0.74 mg/dL (ref 0.40–1.20)
GFR: 98.87 mL/min (ref >60.00)
Glucose, Bld: 72 mg/dL (ref 70–99)
Potassium: 3.6 mEq/L (ref 3.5–5.1)
Sodium: 136 mEq/L (ref 135–145)

## 2020-12-20 LAB — LIPID PANEL
Cholesterol: 169 mg/dL (ref 0–200)
HDL: 59.3 mg/dL (ref >39.00)
LDL Cholesterol: 99 mg/dL (ref 0–99)
NonHDL: 109.55
Total CHOL/HDL Ratio: 3
Triglycerides: 55 mg/dL (ref 0.0–149.0)
VLDL: 11 mg/dL (ref 0.0–40.0)

## 2020-12-20 LAB — HEMOGLOBIN A1C: Hgb A1c MFr Bld: 6.3 % (ref 4.6–6.5)

## 2020-12-20 LAB — TSH: TSH: 2.75 u[IU]/mL (ref 0.35–5.50)

## 2020-12-20 LAB — T4, FREE: Free T4: 1.04 ng/dL (ref 0.60–1.60)

## 2020-12-20 NOTE — Progress Notes (Signed)
Subjective:    Patient ID: Janet [name], female    DOB: 04-07-1975, 45 y.o.   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  She takes tapazole as rx'ed.  pt states she feels well in general.   Past Medical History:  Diagnosis Date   HYPERSOMNIA 04/10/2010   HYPERTHYROIDISM 08/03/2006   Low vitamin D level 2019   level = 7    Past Surgical History:  Procedure Laterality Date   CESAREAN SECTION     x3   DILATION AND CURETTAGE OF UTERUS     TUBAL LIGATION      Social History   Socioeconomic History   Marital status: Married    Spouse name: Not on file   Number of children: 3   Years of education: Not on file   Highest education level: Not on file  Occupational History   Not on file  Tobacco Use   Smoking status: Never   Smokeless tobacco: Never  Vaping Use   Vaping Use: Never used  Substance and Sexual Activity   Alcohol use: No    Alcohol/week: 0.0 standard drinks   Drug use: No   Sexual activity: Yes    Partners: Male    Birth control/protection: Surgical    Comment: Tubal  Other Topics Concern   Not on file  Social History Narrative   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 90 tablet 0   valACYclovir (VALTREX) 500 MG tablet Take 1 tablet (500 mg total) by mouth 2 (two) times daily. Take one tablet BID at onset of symptoms for 3 days. 30 tablet  3   meloxicam (MOBIC) 15 MG tablet Take 1 tablet (15 mg total) by mouth daily. (Patient not taking: Reported on 06/14/2020) 30 tablet 0   metroNIDAZOLE (FLAGYL) 500 MG tablet Take 1 tablet (500 mg total) by mouth 2 (two) times daily. (Patient not taking: Reported on 06/14/2020) 14 tablet 0   No current facility-administered medications on file prior to visit.    Allergies  Allergen Reactions   Sulfa Antibiotics Shortness Of Breath and Swelling    Family History  Problem Relation Age of Onset   Hypertension Mother    Hyperlipidemia Mother    Sleep apnea Mother    Thyroid disease Mother    Stroke Mother    Cancer Other        Aunt with Cancer- unknown type   Lupus Other        Uncle   Cancer Other        Lung Cancer-Uncle    BP 120/60 (BP Location: Right Arm, Patient Position: Sitting, Cuff Size: Large)   Pulse 78   Ht 5' 6.25" (1.683 m)   Wt 192 lb 12.8 oz (87.5 kg)   SpO2 96%   BMI 30.88 kg/m  Review of Systems Denies fever.      Objective:   Physical Exam NECK: There is no palpable thyroid enlargement.  No thyroid nodule is palpable.  No palpable lymphadenopathy at the anterior neck.     Lab Results  Component Value Date   TSH 2.75 12/20/2020      Assessment & Plan:  Hyperthyroidism: well-controlled.  Please continue the same methimazole Hyperglycemia: recheck today

## 2020-12-20 NOTE — Patient Instructions (Addendum)
Blood tests are requested for you today.  We'll let you know about the results.  If ever you have fever while taking methimazole, stop it and call us, even if the reason is obvious, because of the risk of a rare side-effect. It is best to never miss the medication.  However, if you do miss it, next best is to double up the next time.   Please come back for a follow-up appointment in 6 months.   

## 2021-01-05 ENCOUNTER — Other Ambulatory Visit: Payer: Self-pay | Admitting: Endocrinology

## 2021-01-05 DIAGNOSIS — E0500 Thyrotoxicosis with diffuse goiter without thyrotoxic crisis or storm: Secondary | ICD-10-CM

## 2021-03-07 ENCOUNTER — Ambulatory Visit: Payer: No Typology Code available for payment source | Admitting: Family Medicine

## 2021-03-14 ENCOUNTER — Encounter: Payer: Self-pay | Admitting: Family Medicine

## 2021-03-14 ENCOUNTER — Ambulatory Visit (INDEPENDENT_AMBULATORY_CARE_PROVIDER_SITE_OTHER): Payer: No Typology Code available for payment source | Admitting: Family Medicine

## 2021-03-14 VITALS — BP 118/62 | HR 80 | Temp 98.2°F | Wt 187.0 lb

## 2021-03-14 DIAGNOSIS — K5900 Constipation, unspecified: Secondary | ICD-10-CM | POA: Diagnosis not present

## 2021-03-14 DIAGNOSIS — R7303 Prediabetes: Secondary | ICD-10-CM

## 2021-03-14 DIAGNOSIS — R161 Splenomegaly, not elsewhere classified: Secondary | ICD-10-CM

## 2021-03-14 DIAGNOSIS — R1010 Upper abdominal pain, unspecified: Secondary | ICD-10-CM | POA: Diagnosis not present

## 2021-03-14 DIAGNOSIS — Z13220 Encounter for screening for lipoid disorders: Secondary | ICD-10-CM | POA: Diagnosis not present

## 2021-03-14 LAB — COMPREHENSIVE METABOLIC PANEL
ALT: 18 U/L (ref 0–35)
AST: 20 U/L (ref 0–37)
Albumin: 4.2 g/dL (ref 3.5–5.2)
Alkaline Phosphatase: 64 U/L (ref 39–117)
BUN: 13 mg/dL (ref 6–23)
CO2: 29 mEq/L (ref 19–32)
Calcium: 9.2 mg/dL (ref 8.4–10.5)
Chloride: 103 mEq/L (ref 96–112)
Creatinine, Ser: 0.74 mg/dL (ref 0.40–1.20)
GFR: 98.71 mL/min (ref >60.00)
Glucose, Bld: 76 mg/dL (ref 70–99)
Potassium: 4.1 mEq/L (ref 3.5–5.1)
Sodium: 138 mEq/L (ref 135–145)
Total Bilirubin: 0.4 mg/dL (ref 0.2–1.2)
Total Protein: 7.6 g/dL (ref 6.0–8.3)

## 2021-03-14 LAB — CBC WITH DIFFERENTIAL/PLATELET
Basophils Absolute: 0 10*3/uL (ref 0.0–0.1)
Basophils Relative: 0.5 % (ref 0.0–3.0)
Eosinophils Absolute: 0.3 10*3/uL (ref 0.0–0.7)
Eosinophils Relative: 6 % — ABNORMAL HIGH (ref 0.0–5.0)
HCT: 39.7 % (ref 36.0–46.0)
Hemoglobin: 13.2 g/dL (ref 12.0–15.0)
Lymphocytes Relative: 38.3 % (ref 12.0–46.0)
Lymphs Abs: 2 10*3/uL (ref 0.7–4.0)
MCHC: 33.2 g/dL (ref 30.0–36.0)
MCV: 84.6 fl (ref 78.0–100.0)
Monocytes Absolute: 0.4 10*3/uL (ref 0.1–1.0)
Monocytes Relative: 6.7 % (ref 3.0–12.0)
Neutro Abs: 2.6 10*3/uL (ref 1.4–7.7)
Neutrophils Relative %: 48.5 % (ref 43.0–77.0)
Platelets: 386 10*3/uL (ref 150.0–400.0)
RBC: 4.7 Mil/uL (ref 3.87–5.11)
RDW: 14 % (ref 11.5–15.5)
WBC: 5.3 10*3/uL (ref 4.0–10.5)

## 2021-03-14 LAB — LIPID PANEL
Cholesterol: 160 mg/dL (ref 0–200)
HDL: 62.6 mg/dL (ref >39.00)
LDL Cholesterol: 85 mg/dL (ref 0–99)
NonHDL: 97.61
Total CHOL/HDL Ratio: 3
Triglycerides: 62 mg/dL (ref 0.0–149.0)
VLDL: 12.4 mg/dL (ref 0.0–40.0)

## 2021-03-14 LAB — T4, FREE: Free T4: 0.86 ng/dL (ref 0.60–1.60)

## 2021-03-14 LAB — HEMOGLOBIN A1C: Hgb A1c MFr Bld: 6 % (ref 4.6–6.5)

## 2021-03-14 LAB — TSH: TSH: 2.36 u[IU]/mL (ref 0.35–5.50)

## 2021-03-14 LAB — LIPASE: Lipase: 75 U/L — ABNORMAL HIGH (ref 11.0–59.0)

## 2021-03-14 NOTE — Progress Notes (Signed)
Subjective:    Patient ID: Janet [name], female    DOB: 1975/02/15, 46 y.o.   MRN: [id number]  Chief Complaint  Patient presents with   Abdominal Pain    Ongoing issue for years, saw GI in the past and told to just take stool softeners, pain mostly on the left side, nausea     HPI Patient was seen today for ongoing concern. Pt with upper abdominal pain, nausea, and intermittent constipation x yrs.  Stools are firm but now occurring daily.  Pt does have to strain when having BMs.  Patient had an episode while lifting forward over the couch and again while exercising.  The pain at those times was midline in mid epigastric to periumbilical areas.  Typical abdominal pain can occur anytime, is in upper abdomen on L abdomen.  In the past seen by GI, given linzess, but did not notice a difference.  Pt inquires about colonoscopy/imaging.  Patient denies hematochezia, hemorrhoids, dysuria, emesis, pain with eating, increased NSAID use, increased flatus.  Pt inquires about having Hgb A1C checked.  States it was elevated recently 2/2 eating more sweets.  Pt typically eats something sweet daily, especially at work. Past Medical History:  Diagnosis Date   HYPERSOMNIA 04/10/2010   HYPERTHYROIDISM 08/03/2006   Low vitamin D level 2019   level = 7    Allergies  Allergen Reactions   Sulfa Antibiotics Shortness Of Breath and Swelling    ROS General: Denies fever, chills, night sweats, changes in weight, changes in appetite HEENT: Denies headaches, ear pain, changes in vision, rhinorrhea, sore throat CV: Denies CP, palpitations, SOB, orthopnea Pulm: Denies SOB, cough, wheezing GI: Denies vomiting, diarrhea, + abdominal pain, nausea, h/o constipation GU: Denies dysuria, hematuria, frequency, vaginal discharge Msk: Denies muscle cramps, joint pains Neuro: Denies weakness, numbness, tingling Skin: Denies rashes, bruising Psych: Denies depression, anxiety, hallucinations    Objective:    Blood  pressure 118/62, pulse 80, temperature 98.2 F (36.8 C), temperature source Oral, weight 187 lb (84.8 kg), SpO2 99 %.  Gen. Pleasant, well-nourished, in no distress, normal affect   HEENT: [place]/AT, face symmetric, conjunctiva clear, no scleral icterus, PERRLA, EOMI, nares patent without drainage, pharynx without erythema or exudate. Neck: No JVD, no thyromegaly, no carotid bruits Lungs: no accessory muscle use, CTAB, no wheezes or rales Cardiovascular: RRR, no m/r/g, no peripheral edema Abdomen: BS present, soft, TTP of LUQ, ND, splenic edge palpable underneath left rib cage, stool palpated in transverse colon. Musculoskeletal: No deformities, no cyanosis or clubbing, normal tone Neuro:  A&Ox3, CN II-XII intact, normal gait Skin:  Warm, no lesions/ rash  Wt Readings from Last 3 Encounters:  03/14/21 187 lb (84.8 kg)  12/20/20 192 lb 12.8 oz (87.5 kg)  06/14/20 193 lb (87.5 kg)    Lab Results  Component Value Date   WBC 5.9 06/14/2020   HGB 12.9 06/14/2020   HCT 37.8 06/14/2020   PLT 354.0 06/14/2020   GLUCOSE 72 12/20/2020   CHOL 169 12/20/2020   TRIG 55.0 12/20/2020   HDL 59.30 12/20/2020   LDLCALC 99 12/20/2020   ALT 24 06/14/2020   AST 13 06/14/2020   NA 136 12/20/2020   K 3.6 12/20/2020   CL 103 12/20/2020   CREATININE 0.74 12/20/2020   BUN 14 12/20/2020   CO2 26 12/20/2020   TSH 2.75 12/20/2020   HGBA1C 6.3 12/20/2020    Assessment/Plan:  Pain of upper abdomen -Multifactorial.  Pain with reaching likely MSK.  Other chronic pain  likely 2/2 constipation. -Discussed other possible causes of abdominal pain -We will obtain labs. -Further recommendations based on labs including possible imaging. -Given strict precautions - Plan: CBC with Differential/Platelet, TSH, CMP, T4, Free, Lipase  Constipation, unspecified constipation type -Chronic -Mild improvement is now having daily BM with hard stools and some straining  -Discussed diet changes -Increase p.o. intake of  water and fluids throughout the day -Restart MiraLAX or Colace and senna for continued symptoms  - Plan: TSH, CMP, T4, Free  Splenomegaly  -Asymptomatic -Will obtain labs -Imaging if needed - Plan: CBC with Differential/Platelet  Prediabetes -Lifestyle modifications encouraged - Plan: Hemoglobin A1c, Lipid panel  Screening for cholesterol level -Lifestyle modifications encouraged - Plan: Lipid panel  F/u as needed in the next 3-4 weeks  More than 50% of over 31-33 minutes spent in total in caring for this patient was spent face-to-face, reviewing the chart, counseling and/or coordinating care.   Grier Mitts, MD

## 2021-03-18 ENCOUNTER — Encounter: Payer: Self-pay | Admitting: Family Medicine

## 2021-04-13 ENCOUNTER — Other Ambulatory Visit: Payer: Self-pay | Admitting: Endocrinology

## 2021-04-13 DIAGNOSIS — E0500 Thyrotoxicosis with diffuse goiter without thyrotoxic crisis or storm: Secondary | ICD-10-CM

## 2021-04-16 ENCOUNTER — Other Ambulatory Visit: Payer: Self-pay | Admitting: Gastroenterology

## 2021-04-16 DIAGNOSIS — R748 Abnormal levels of other serum enzymes: Secondary | ICD-10-CM

## 2021-04-16 DIAGNOSIS — R1011 Right upper quadrant pain: Secondary | ICD-10-CM

## 2021-04-25 ENCOUNTER — Other Ambulatory Visit: Payer: No Typology Code available for payment source

## 2021-04-29 ENCOUNTER — Ambulatory Visit
Admission: RE | Admit: 2021-04-29 | Discharge: 2021-04-29 | Disposition: A | Discharge status: Home / Self Care | Payer: No Typology Code available for payment source | Source: Ambulatory Visit | Attending: Gastroenterology | Admitting: Gastroenterology

## 2021-04-29 DIAGNOSIS — R748 Abnormal levels of other serum enzymes: Secondary | ICD-10-CM

## 2021-04-29 DIAGNOSIS — R1011 Right upper quadrant pain: Secondary | ICD-10-CM

## 2021-06-20 ENCOUNTER — Ambulatory Visit: Payer: No Typology Code available for payment source | Admitting: Endocrinology

## 2021-06-21 ENCOUNTER — Other Ambulatory Visit: Payer: Self-pay | Admitting: Gastroenterology

## 2021-06-21 DIAGNOSIS — R1011 Right upper quadrant pain: Secondary | ICD-10-CM

## 2021-06-21 DIAGNOSIS — R1084 Generalized abdominal pain: Secondary | ICD-10-CM

## 2021-07-02 ENCOUNTER — Other Ambulatory Visit: Payer: Self-pay | Admitting: Family Medicine

## 2021-07-02 DIAGNOSIS — Z1231 Encounter for screening mammogram for malignant neoplasm of breast: Secondary | ICD-10-CM

## 2021-07-18 ENCOUNTER — Ambulatory Visit: Admission: RE | Admit: 2021-07-18 | Payer: No Typology Code available for payment source | Source: Ambulatory Visit

## 2021-07-18 ENCOUNTER — Ambulatory Visit
Admission: RE | Admit: 2021-07-18 | Discharge: 2021-07-18 | Disposition: A | Discharge status: Home / Self Care | Payer: No Typology Code available for payment source | Source: Ambulatory Visit | Attending: Gastroenterology | Admitting: Gastroenterology

## 2021-07-18 ENCOUNTER — Ambulatory Visit
Admission: RE | Admit: 2021-07-18 | Discharge: 2021-07-18 | Disposition: A | Discharge status: Home / Self Care | Payer: No Typology Code available for payment source | Source: Ambulatory Visit

## 2021-07-18 DIAGNOSIS — Z1231 Encounter for screening mammogram for malignant neoplasm of breast: Secondary | ICD-10-CM

## 2021-07-18 DIAGNOSIS — R1084 Generalized abdominal pain: Secondary | ICD-10-CM

## 2021-07-18 DIAGNOSIS — R1011 Right upper quadrant pain: Secondary | ICD-10-CM

## 2021-07-18 MED ORDER — IOPAMIDOL (ISOVUE-300) INJECTION 61%
100.0000 mL | Freq: Once | INTRAVENOUS | Status: AC | PRN
  Administered 2021-07-18: 100 mL via INTRAVENOUS

## 2021-07-19 ENCOUNTER — Other Ambulatory Visit: Payer: Self-pay | Admitting: Family Medicine

## 2021-07-19 DIAGNOSIS — R928 Other abnormal and inconclusive findings on diagnostic imaging of breast: Secondary | ICD-10-CM

## 2021-07-25 ENCOUNTER — Ambulatory Visit
Admission: RE | Admit: 2021-07-25 | Discharge: 2021-07-25 | Disposition: A | Discharge status: Home / Self Care | Payer: No Typology Code available for payment source | Source: Ambulatory Visit | Attending: Family Medicine | Admitting: Family Medicine

## 2021-07-25 ENCOUNTER — Ambulatory Visit: Payer: No Typology Code available for payment source

## 2021-07-25 DIAGNOSIS — R928 Other abnormal and inconclusive findings on diagnostic imaging of breast: Secondary | ICD-10-CM

## 2021-07-25 LAB — HM MAMMOGRAPHY

## 2021-08-08 ENCOUNTER — Ambulatory Visit: Payer: No Typology Code available for payment source | Admitting: Internal Medicine

## 2021-08-08 ENCOUNTER — Other Ambulatory Visit: Payer: Self-pay | Admitting: Endocrinology

## 2021-08-08 DIAGNOSIS — E0500 Thyrotoxicosis with diffuse goiter without thyrotoxic crisis or storm: Secondary | ICD-10-CM

## 2021-08-08 MED ORDER — METHIMAZOLE 5 MG PO TABS: 5.0000 mg | ORAL_TABLET | Freq: Every day | ORAL | 0 refills | Status: DC

## 2021-09-03 ENCOUNTER — Encounter (HOSPITAL_BASED_OUTPATIENT_CLINIC_OR_DEPARTMENT_OTHER): Payer: Self-pay

## 2021-09-03 ENCOUNTER — Other Ambulatory Visit: Payer: Self-pay

## 2021-09-03 DIAGNOSIS — M25511 Pain in right shoulder: Secondary | ICD-10-CM | POA: Insufficient documentation

## 2021-09-03 DIAGNOSIS — M542 Cervicalgia: Secondary | ICD-10-CM | POA: Insufficient documentation

## 2021-09-03 NOTE — ED Triage Notes (Signed)
Patient here POV from Home.  Endorses that Upon Deep Inspiration the patient notes pain to Right Lateral Neck that Radiates down to Right Shoulder. Associated with Mild to Moderates Headaches. Pain began while at Work at Rest and worsened throughout the Day.  No SOB. No Anticoagulants.  NAD Noted during Triage. A&Ox4. GCS 15. Ambulatory.

## 2021-09-04 ENCOUNTER — Emergency Department (HOSPITAL_BASED_OUTPATIENT_CLINIC_OR_DEPARTMENT_OTHER)
Admission: EM | Admit: 2021-09-04 | Discharge: 2021-09-04 | Disposition: A | Discharge status: Home / Self Care | Payer: No Typology Code available for payment source | Attending: Emergency Medicine | Admitting: Emergency Medicine

## 2021-09-04 DIAGNOSIS — M542 Cervicalgia: Secondary | ICD-10-CM

## 2021-09-04 MED ORDER — CYCLOBENZAPRINE HCL 10 MG PO TABS
10.0000 mg | ORAL_TABLET | Freq: Once | ORAL | Status: AC
Start: 2021-09-04 — End: 2021-09-04
  Administered 2021-09-04: 10 mg via ORAL
  Filled 2021-09-04: qty 1

## 2021-09-04 MED ORDER — NAPROXEN 500 MG PO TABS: 500.0000 mg | ORAL_TABLET | Freq: Two times a day (BID) | ORAL | 0 refills | Status: DC

## 2021-09-04 MED ORDER — CYCLOBENZAPRINE HCL 10 MG PO TABS: 10.0000 mg | ORAL_TABLET | Freq: Three times a day (TID) | ORAL | 0 refills | Status: DC | PRN

## 2021-09-04 MED ORDER — IBUPROFEN 400 MG PO TABS
600.0000 mg | ORAL_TABLET | Freq: Once | ORAL | Status: AC
  Administered 2021-09-04: 600 mg via ORAL
  Filled 2021-09-04: qty 1

## 2021-09-04 NOTE — ED Provider Notes (Signed)
[facility] EMERGENCY DEPT Provider Note   CSN: [id number] Arrival date & time: 09/03/21  2037     History  Chief Complaint  Patient presents with   Neck Pain    Geneal A Canevari is a 46 y.o. female.  Patient is a 46 year old female with past medical history of hyperthyroidism, dyslipidemia.  Patient presenting today with complaints of right-sided neck and upper shoulder pain.  This began earlier today while she was at work.  It began in the absence of any injury, trauma, or repetitive movement.  She describes pain to the soft tissues to the right lateral neck and trapezius muscle.  There is no radiation to the arm or jaw.  She denies any chest pain or shortness of breath, but states the pain is worse when she takes a deep breath.  The history is provided by the patient.       Home Medications Prior to Admission medications   Medication Sig Start Date End Date Taking? Authorizing Provider  methimazole (TAPAZOLE) 5 MG tablet Take 1 tablet (5 mg total) by mouth daily. 08/08/21   Shamleffer, Konrad Dolores, MD  valACYclovir (VALTREX) 500 MG tablet Take 1 tablet (500 mg total) by mouth 2 (two) times daily. Take one tablet BID at onset of symptoms for 3 days. 09/28/20   Deeann Saint, MD      Allergies    Sulfa antibiotics    Review of Systems   Review of Systems  All other systems reviewed and are negative.   Physical Exam Updated Vital Signs BP 123/75 (BP Location: Left Arm)   Pulse 87   Temp 98.8 F (37.1 C)   Resp 18   Ht 5\' 6"  (1.676 m)   Wt 86.2 kg   SpO2 99%   BMI 30.67 kg/m  Physical Exam Vitals and nursing note reviewed.  Constitutional:      General: She is not in acute distress.    Appearance: She is well-developed. She is not diaphoretic.  HENT:     Head: Normocephalic and atraumatic.  Cardiovascular:     Rate and Rhythm: Normal rate and regular rhythm.     Heart sounds: No murmur heard.    No friction rub. No gallop.  Pulmonary:      Effort: Pulmonary effort is normal. No respiratory distress.     Breath sounds: Normal breath sounds. No wheezing.  Abdominal:     General: Bowel sounds are normal. There is no distension.     Palpations: Abdomen is soft.     Tenderness: There is no abdominal tenderness.  Musculoskeletal:        General: Normal range of motion.     Cervical back: Normal range of motion and neck supple.     Comments: There is tenderness to palpation of the soft tissues to the right trapezius muscle and right lateral neck.  There is no visible or palpable abnormality otherwise.  Ulnar and radial pulses are intact to the right upper extremity.  Motor and sensation is intact throughout the entire hand.  Skin:    General: Skin is warm and dry.  Neurological:     General: No focal deficit present.     Mental Status: She is alert and oriented to person, place, and time.     ED Results / Procedures / Treatments   Labs (all labs ordered are listed, but only abnormal results are displayed) Labs Reviewed - No data to display  EKG None  Radiology No results found.  Procedures Procedures    Medications Ordered in ED Medications  ibuprofen (ADVIL) tablet 600 mg (has no administration in time range)  cyclobenzaprine (FLEXERIL) tablet 10 mg (has no administration in time range)    ED Course/ Medical Decision Making/ A&P  Patient presenting with pain to the right lateral neck and trapezius muscle that began in the absence of any specific injury or trauma.  I highly suspect a musculoskeletal etiology.  Patient to be treated with NSAIDs, muscle relaxers, and follow-up as needed.  I highly doubt a cardiac etiology as she is not experiencing any chest pain, shortness of breath, and symptoms are reproducible with palpation of the lateral neck.  Final Clinical Impression(s) / ED Diagnoses Final diagnoses:  None    Rx / DC Orders ED Discharge Orders     None         Geoffery Lyons, MD 09/04/21  [phone]

## 2021-09-04 NOTE — ED Notes (Signed)
Reviewed AVS/discharge instruction with patient. Time allotted for and all questions answered. Patient is agreeable for d/c and escorted to ed exit by staff.  

## 2021-09-04 NOTE — Discharge Instructions (Signed)
Begin taking naproxen as prescribed.  Begin taking Flexeril as prescribed as needed for pain not relieved with naproxen.  Follow-up with your primary doctor if your symptoms are not improving in the next week.

## 2021-09-17 ENCOUNTER — Encounter: Payer: Self-pay | Admitting: Nurse Practitioner

## 2021-09-17 ENCOUNTER — Ambulatory Visit (INDEPENDENT_AMBULATORY_CARE_PROVIDER_SITE_OTHER): Payer: No Typology Code available for payment source

## 2021-09-17 ENCOUNTER — Ambulatory Visit (INDEPENDENT_AMBULATORY_CARE_PROVIDER_SITE_OTHER): Payer: No Typology Code available for payment source | Admitting: Nurse Practitioner

## 2021-09-17 VITALS — BP 112/72 | HR 81 | Temp 98.0°F | Ht 66.0 in | Wt 193.0 lb

## 2021-09-17 DIAGNOSIS — R053 Chronic cough: Secondary | ICD-10-CM

## 2021-09-17 MED ORDER — OMEPRAZOLE 20 MG PO CPDR: 20.0000 mg | DELAYED_RELEASE_CAPSULE | Freq: Every day | ORAL | 3 refills | Status: DC

## 2021-09-17 NOTE — Progress Notes (Signed)
New patient visit  Patient: Janet [name]   DOB: [birth year]   46 y.o. Female  MRN: [id number] Visit Date: 09/17/2021  Subjective:    Chief Complaint  Patient presents with   Establish Care    New Pt, Est Care C/o lingering cough post COVID since last year Requesting records for PAP    Janet [name] is a 46 y.o. female who presents today as a new patient to establish care.  Cough This is a chronic problem. The current episode started more than 1 year ago. The problem has been waxing and waning. The cough is Non-productive. Pertinent negatives include no chest pain, fever, heartburn, myalgias, nasal congestion, postnasal drip, rhinorrhea, sore throat, shortness of breath or wheezing. The symptoms are aggravated by lying down. Treatments tried: antihistamine. The treatment provided no relief. Her past medical history is significant for environmental allergies. There is no history of asthma, bronchitis, COPD, emphysema or pneumonia.   Onset of symptoms post Covid infection 1.61yrs ago Has intermittent GERD symptoms with certain foods or eating close to bedtime. Has sensation of mucus in throat, but cough is non productive. No sore throat or dysphagia or change in voice. No tobacco use or second hand exposure, No ETOH, no marijuana use. Report snoring in supine position per her husband.  BP Readings from Last 3 Encounters:  09/17/21 112/72  09/04/21 127/70  03/14/21 118/62    Wt Readings from Last 3 Encounters:  09/17/21 193 lb (87.5 kg)  09/03/21 190 lb 0.6 oz (86.2 kg)  03/14/21 187 lb (84.8 kg)    No problem-specific Assessment & Plan notes found for this encounter.  Patient Active Problem List   Diagnosis Date Noted   Hyperglycemia 12/20/2020   Dyslipidemia 12/20/2020   Flat feet 01/02/2020   Vitamin D deficiency 07/21/2018   Chest wall pain 10/20/2017   Trapezius strain 04/16/2017   Gastroenteritis 04/16/2017   Elevated lipase 12/08/2016   Wellness examination  02/15/2015   Pre-employment examination 04/19/2013   Back pain 11/25/2012   Abdominal tenderness 11/25/2012   Dyspnea 04/25/2011   Palpitations 04/25/2011   Paresthesia 12/30/2010   Preventative health care 12/30/2010   Right carpal tunnel syndrome 12/30/2010   HYPERSOMNIA 04/10/2010   Cough 03/05/2009   FATIGUE 01/19/2008   Hyperthyroidism 08/03/2006   Social History   Tobacco Use   Smoking status: Never   Smokeless tobacco: Never  Vaping Use   Vaping Use: Never used  Substance Use Topics   Alcohol use: No    Alcohol/week: 0.0 standard drinks of alcohol   Drug use: No   Family History  Problem Relation Age of Onset   Hypertension Mother    Hyperlipidemia Mother    Sleep apnea Mother    Thyroid disease Mother    Stroke Mother    Cancer Other        Aunt with Cancer- unknown type   Lupus Other        Uncle   Cancer Other        Lung Cancer-Uncle   Breast cancer Neg Hx          04/19/2013    3:51 PM 12/05/2016    2:26 PM 03/14/2021    1:43 PM 09/03/2021    8:57 PM  Fall Risk  Falls in the past year? No No 0   Patient Fall Risk Level    Low fall risk   Outpatient Medications Prior to Visit  Medication Sig   cholecalciferol (VITAMIN D3)  25 MCG (1000 UNIT) tablet 1 tablet   methimazole (TAPAZOLE) 5 MG tablet Take 1 tablet (5 mg total) by mouth daily.   valACYclovir (VALTREX) 500 MG tablet Take 1 tablet (500 mg total) by mouth 2 (two) times daily. Take one tablet BID at onset of symptoms for 3 days.   cyclobenzaprine (FLEXERIL) 10 MG tablet Take 1 tablet (10 mg total) by mouth 3 (three) times daily as needed for muscle spasms. (Patient not taking: Reported on 09/17/2021)   [DISCONTINUED] naproxen (NAPROSYN) 500 MG tablet Take 1 tablet (500 mg total) by mouth 2 (two) times daily.   No facility-administered medications prior to visit.   Allergies  Allergen Reactions   Sulfa Antibiotics Shortness Of Breath and Swelling    Patient Care Team: Eliyahu Bille, Bonna Gains,  NP as PCP - General (Internal Medicine) Gerald Leitz, MD as Consulting Physician (Obstetrics and Gynecology)  Review of Systems  Constitutional:  Negative for fever.  HENT:  Negative for postnasal drip, rhinorrhea and sore throat.   Respiratory:  Positive for cough. Negative for shortness of breath and wheezing.   Cardiovascular:  Negative for chest pain.  Gastrointestinal:  Negative for heartburn.  Musculoskeletal:  Negative for myalgias.  Allergic/Immunologic: Positive for environmental allergies.       Objective:  BP 112/72 (BP Location: Left Arm, Patient Position: Sitting, Cuff Size: Normal)   Pulse 81   Temp 98 F (36.7 C) (Temporal)   Ht 5\' 6"  (1.676 m)   Wt 193 lb (87.5 kg)   SpO2 97%   BMI 31.15 kg/m     Physical Exam Vitals reviewed.  HENT:     Mouth/Throat:     Pharynx: Uvula midline. No posterior oropharyngeal erythema.     Tonsils: No tonsillar exudate or tonsillar abscesses. 1+ on the right. 1+ on the left.  Cardiovascular:     Rate and Rhythm: Normal rate and regular rhythm.     Pulses: Normal pulses.     Heart sounds: Normal heart sounds.  Pulmonary:     Effort: Pulmonary effort is normal.     Breath sounds: Normal breath sounds.  Musculoskeletal:     Cervical back: Normal range of motion and neck supple.  Lymphadenopathy:     Cervical: No cervical adenopathy.  Neurological:     Mental Status: She is alert.    No results found for any visits on 09/17/21.    Assessment & Plan:    Problem List Items Addressed This Visit       Other   Cough - Primary   Relevant Medications   omeprazole (PRILOSEC) 20 MG capsule   Other Relevant Orders   DG Chest 2 View  Chronic bronchitis vs GERD? Start omeprazole 20mg  BID If normal CXR and no improvement with omeprazole, I will order pulmonary function test and referral to ENT.  Return in about 3 months (around 12/18/2021) for cough.     , NP

## 2021-09-17 NOTE — Progress Notes (Unsigned)
Initial visit for a chronic cough x several weeks. Mostly non productive Never a smoker

## 2021-09-17 NOTE — Patient Instructions (Signed)
Go to lab Start omeprazole 20mg  BID If normal CXR and no improvement with omeprazole, I will order pulmonary function test and referral to ENT.  Food Choices for Gastroesophageal Reflux Disease, Adult When you have gastroesophageal reflux disease (GERD), the foods you eat and your eating habits are very important. Choosing the right foods can help ease your discomfort. Think about working with a food expert (dietitian) to help you make good choices. What are tips for following this plan? Reading food labels Look for foods that are low in saturated fat. Foods that may help with your symptoms include: Foods that have less than 5% of daily value (DV) of fat. Foods that have 0 grams of trans fat. Cooking Do not fry your food. Cook your food by baking, steaming, grilling, or broiling. These are all methods that do not need a lot of fat for cooking. To add flavor, try to use herbs that are low in spice and acidity. Meal planning  Choose healthy foods that are low in fat, such as: Fruits and vegetables. Whole grains. Low-fat dairy products. Lean meats, fish, and poultry. Eat small meals often instead of eating 3 large meals each day. Eat your meals slowly in a place where you are relaxed. Avoid bending over or lying down until 2-3 hours after eating. Limit high-fat foods such as fatty meats or fried foods. Limit your intake of fatty foods, such as oils, butter, and shortening. Avoid the following as told by your doctor: Foods that cause symptoms. These may be different for different people. Keep a food diary to keep track of foods that cause symptoms. Alcohol. Drinking a lot of liquid with meals. Eating meals during the 2-3 hours before bed. Lifestyle Stay at a healthy weight. Ask your doctor what weight is healthy for you. If you need to lose weight, work with your doctor to do so safely. Exercise for at least 30 minutes on 5 or more days each week, or as told by your doctor. Wear  loose-fitting clothes. Do not smoke or use any products that contain nicotine or tobacco. If you need help quitting, ask your doctor. Sleep with the head of your bed higher than your feet. Use a wedge under the mattress or blocks under the bed frame to raise the head of the bed. Chew sugar-free gum after meals. What foods should eat?  Eat a healthy, well-balanced diet of fruits, vegetables, whole grains, low-fat dairy products, lean meats, fish, and poultry. Each person is different. Foods that may cause symptoms in one person may not cause any symptoms in another person. Work with your doctor to find foods that are safe for you. The items listed above may not be a complete list of what you can eat and drink. Contact a food expert for more options. What foods should I avoid? Limiting some of these foods may help in managing the symptoms of GERD. Everyone is different. Talk with a food expert or your doctor to help you find the exact foods to avoid, if any. Fruits Any fruits prepared with added fat. Any fruits that cause symptoms. For some people, this may include citrus fruits, such as oranges, grapefruit, pineapple, and lemons. Vegetables Deep-fried vegetables. fries. Any vegetables prepared with added fat. Any vegetables that cause symptoms. For some people, this may include tomatoes and tomato products, chili peppers, onions and garlic, and horseradish. Grains Pastries or quick breads with added fat. Meats and other proteins High-fat meats, such as fatty beef or pork, hot  dogs, ribs, ham, sausage, salami, and bacon. Fried meat or protein, including fried fish and fried chicken. Nuts and nut butters, in large amounts. Dairy Whole milk and chocolate milk. Sour cream. Cream. Ice cream. Cream cheese. Milkshakes. Fats and oils Butter. Margarine. Shortening. Ghee. Beverages Coffee and tea, with or without caffeine. Carbonated beverages. Sodas. Energy drinks. Fruit juice made with acidic  fruits, such as orange or grapefruit. Tomato juice. Alcoholic drinks. Sweets and desserts Chocolate and cocoa. Donuts. Seasonings and condiments Pepper. Peppermint and spearmint. Added salt. Any condiments, herbs, or seasonings that cause symptoms. For some people, this may include curry, hot sauce, or vinegar-based salad dressings. The items listed above may not be a complete list of what you should not eat and drink. Contact a food expert for more options. Questions to ask your doctor Diet and lifestyle changes are often the first steps that are taken to manage symptoms of GERD. If diet and lifestyle changes do not help, talk with your doctor about taking medicines. Where to find more information [employer] for G[employer]intestinal [employer]: aboutgerd.org Summary When you have GERD, food and lifestyle choices are very important in easing your symptoms. Eat small meals often instead of 3 large meals a day. Eat your meals slowly and in a place where you are relaxed. Avoid bending over or lying down until 2-3 hours after eating. Limit high-fat foods such as fatty meats or fried foods. This information is not intended to replace advice given to you by your health care provider. Make sure you discuss any questions you have with your health care provider. Document Revised: 08/08/2019 Document Reviewed: 08/08/2019 Elsevier Patient Education  2023 [employer].

## 2021-10-01 ENCOUNTER — Encounter: Payer: Self-pay | Admitting: Internal Medicine

## 2021-10-01 ENCOUNTER — Ambulatory Visit (INDEPENDENT_AMBULATORY_CARE_PROVIDER_SITE_OTHER): Payer: No Typology Code available for payment source | Admitting: Internal Medicine

## 2021-10-01 VITALS — BP 112/70 | HR 89 | Ht 66.0 in | Wt 192.0 lb

## 2021-10-01 DIAGNOSIS — E0500 Thyrotoxicosis with diffuse goiter without thyrotoxic crisis or storm: Secondary | ICD-10-CM

## 2021-10-01 DIAGNOSIS — E0590 Thyrotoxicosis, unspecified without thyrotoxic crisis or storm: Secondary | ICD-10-CM

## 2021-10-01 NOTE — Progress Notes (Unsigned)
Name: Janet [name]  MRN/ DOB: [id number], 11-Dec-1975    Age/ Sex: 46 y.o., female     PCP: Anne Ng, NP   Reason for Endocrinology Evaluation: Hyperthyroidism     Initial Endocrinology Clinic Visit: 02/27/2012    PATIENT IDENTIFIER: [name] is a 46 y.o., female with a past medical history of hyperthyroidism. She has followed with [facility] since 02/27/2012 for consultative assistance with management of her hyperthyroidism.   HISTORICAL SUMMARY: The patient was first diagnosed with hyperthyroidism in 2007 during pregnancy.  Thyroid uptake and scan in May 2007 demonstrated diffuse enlargement of the thyroid gland with a multinodular appearance. A discrete toxic nodule is not demonstrated.   Thyroid ultrasound in April 2018 showed normal thyroid with no nodules.  Patient has been on methimazole since her diagnosis  Mother with thyroid disease SUBJECTIVE:    Today (10/01/2021):  Janet [name] is here for hyperthyroidism.  Weight has been stable Has occasional abdominal pain but no nausea Has constipation  Denies palpitations unless she has caffeine Occasional tremors  No local neck symptoms     HISTORY:  Past Medical History:  Past Medical History:  Diagnosis Date   Abdominal tenderness 11/25/2012   Back pain 11/25/2012   Dyspnea 04/25/2011   Gastroenteritis 04/16/2017   HYPERSOMNIA 04/10/2010   HYPERTHYROIDISM 08/03/2006   Low vitamin D level 2019   level = 7   Past Surgical History:  Past Surgical History:  Procedure Laterality Date   CESAREAN SECTION     x3   DILATION AND CURETTAGE OF UTERUS     TUBAL LIGATION     Social History:  reports that she has never smoked. She has never used smokeless tobacco. She reports that she does not drink alcohol and does not use drugs. Family History:  Family History  Problem Relation Age of Onset   Hypertension Mother    Hyperlipidemia Mother    Sleep apnea Mother    Thyroid disease  Mother    Stroke Mother    Cancer Other        Aunt with Cancer- unknown type   Lupus Other        Uncle   Cancer Other        Lung Cancer-Uncle   Breast cancer Neg Hx      HOME MEDICATIONS: Allergies as of 10/01/2021       Reactions   Sulfa Antibiotics Shortness Of Breath, Swelling        Medication List        Accurate as of October 01, 2021  3:36 PM. If you have any questions, ask your nurse or doctor.          STOP taking these medications    cyclobenzaprine 10 MG tablet Commonly known as: FLEXERIL Stopped by: Scarlette Shorts, MD       TAKE these medications    cholecalciferol 25 MCG (1000 UNIT) tablet Commonly known as: VITAMIN D3 1 tablet   methimazole 5 MG tablet Commonly known as: TAPAZOLE Take 1 tablet (5 mg total) by mouth daily.   omeprazole 20 MG capsule Commonly known as: PRILOSEC Take 1 capsule (20 mg total) by mouth daily.   valACYclovir 500 MG tablet Commonly known as: VALTREX Take 1 tablet (500 mg total) by mouth 2 (two) times daily. Take one tablet BID at onset of symptoms for 3 days.          OBJECTIVE:   PHYSICAL EXAM: VS: BP  112/70 (BP Location: Left Arm, Patient Position: Sitting, Cuff Size: Small)   Pulse 89   Ht 5\' 6"  (1.676 m)   Wt 192 lb (87.1 kg)   SpO2 99%   BMI 30.99 kg/m    EXAM: General: Pt appears well and is in NAD  Eyes: External eye exam normal without stare, lid lag or exophthalmos.  EOM intact.  PERRL.  Neck: General: Supple without adenopathy. Thyroid: Thyroid size normal.  No goiter or nodules appreciated. No thyroid bruit.  Lungs: Clear with good BS bilat with no rales, rhonchi, or wheezes  Heart: Auscultation: RRR.  Abdomen: Normoactive bowel sounds, soft, nontender, without masses or organomegaly palpable  Extremities:  BL LE: No pretibial edema normal ROM and strength.  Mental Status: Judgment, insight: Intact Orientation: Oriented to time, place, and person Mood and affect: No  depression, anxiety, or agitation     DATA REVIEWED: ***   Latest Reference Range & Units 03/14/21 13:43  Sodium 135 - 145 mEq/L 138  Potassium 3.5 - 5.1 mEq/L 4.1  Chloride 96 - 112 mEq/L 103  CO2 19 - 32 mEq/L 29  Glucose 70 - 99 mg/dL 76  BUN 6 - 23 mg/dL 13  Creatinine 05/12/21 - 7.16 mg/dL 9.67  Calcium 8.4 - 8.93 mg/dL 9.2  Alkaline Phosphatase 39 - 117 U/L 64  Albumin 3.5 - 5.2 g/dL 4.2  Lipase 81.0 - 17.5 U/L 75.0 (H)  AST 0 - 37 U/L 20  ALT 0 - 35 U/L 18  Total Protein 6.0 - 8.3 g/dL 7.6  Total Bilirubin 0.2 - 1.2 mg/dL 0.4  GFR 10.2 mL/min 98.71  Total CHOL/HDL Ratio  3  Cholesterol 0 - 200 mg/dL >58.52  HDL Cholesterol 778 mg/dL >24.23  LDL (calc) 0 - 99 mg/dL 85  NonHDL  53.61  Triglycerides 0.0 - 149.0 mg/dL 44.31  VLDL 0.0 - 54.0 mg/dL 08.6    Latest Reference Range & Units 03/14/21 13:43  WBC 4.0 - 10.5 K/uL 5.3  RBC 3.87 - 5.11 Mil/uL 4.70  Hemoglobin 12.0 - 15.0 g/dL 05/12/21  HCT 95.0 - 93.2 % 39.7  MCV 78.0 - 100.0 fl 84.6  MCHC 30.0 - 36.0 g/dL 67.1  RDW 24.5 - 80.9 % 14.0  Platelets 150.0 - 400.0 K/uL 386.0  Neutrophils 43.0 - 77.0 % 48.5  Lymphocytes 12.0 - 46.0 % 38.3  Monocytes Relative 3.0 - 12.0 % 6.7  Eosinophil 0.0 - 5.0 % 6.0 (H)  Basophil 0.0 - 3.0 % 0.5  NEUT# 1.4 - 7.7 K/uL 2.6  Lymphocyte # 0.7 - 4.0 K/uL 2.0  Monocyte # 0.1 - 1.0 K/uL 0.4  Eosinophils Absolute 0.0 - 0.7 K/uL 0.3  Basophils Absolute 0.0 - 0.1 K/uL 0.0   ASSESSMENT / PLAN / RECOMMENDATIONS:   Hyperthyroidism:  -Patient is clinically euthyroid -Thyroid uptake and scan in 2007 showed multinodular goiter, thyroid ultrasound in 2018 did not show any evidence of nodules (of note the patient does not recall having thyroid ultrasound) -We did briefly discussed long-term treatment to include radioactive iodine ablation versus total thyroidectomy.  We did discuss the need for lifelong LT-for replacement with these options -At this time she is tolerating methimazole without  side effect -Patient requesting lipid panel check as she has heard that dyslipidemia is associated with thyroid disease.  I did explain to the patient that hypothyroidism is usually associated with LDL elevation but patient with Hx hyperthyroidism, and her most recent lipid panel has been normal -CMP and CBC normal in February  2023  Medications   Methimazole 5 mg   Follow-up in 6 months   Signed electronically by: Mack Guise, MD  [facility]  [facility] [address] [place], [place] [address] Phone: [phone] FAX: [phone]      CC: [address], [place] [address] Phone: [phone]  Fax: [phone]   Return to Endocrinology clinic as below: No future appointments.

## 2021-10-02 LAB — T3: T3, Total: 106 ng/dL (ref 76–181)

## 2021-10-02 LAB — T4, FREE: Free T4: 0.94 ng/dL (ref 0.60–1.60)

## 2021-10-02 LAB — TSH: TSH: 2.99 u[IU]/mL (ref 0.35–5.50)

## 2021-10-02 MED ORDER — METHIMAZOLE 5 MG PO TABS: 5.0000 mg | ORAL_TABLET | Freq: Every day | ORAL | 3 refills | Status: DC

## 2021-10-21 ENCOUNTER — Telehealth: Payer: No Typology Code available for payment source | Admitting: Physician Assistant

## 2021-10-21 DIAGNOSIS — B9689 Other specified bacterial agents as the cause of diseases classified elsewhere: Secondary | ICD-10-CM | POA: Diagnosis not present

## 2021-10-21 DIAGNOSIS — J208 Acute bronchitis due to other specified organisms: Secondary | ICD-10-CM | POA: Diagnosis not present

## 2021-10-21 MED ORDER — BENZONATATE 100 MG PO CAPS: 100.0000 mg | ORAL_CAPSULE | Freq: Three times a day (TID) | ORAL | 0 refills | Status: DC | PRN

## 2021-10-21 MED ORDER — AZITHROMYCIN 250 MG PO TABS: ORAL_TABLET | ORAL | 0 refills | Status: AC

## 2021-10-21 NOTE — Progress Notes (Signed)
We are sorry that you are not feeling well.  Here is how we plan to help!  Based on your presentation I believe you most likely have A cough due to bacteria.  When patients have a fever and a productive cough with a change in color or increased sputum production, we are concerned about bacterial bronchitis.  If left untreated it can progress to pneumonia.  If your symptoms do not improve with your treatment plan it is important that you contact your provider.   I have prescribed Azithromyin 250 mg: two tablets now and then one tablet daily for 4 additonal days    In addition you may use A non-prescription cough medication called Mucinex DM: take 2 tablets every 12 hours. and A prescription cough medication called Tessalon Perles 100mg. You may take 1-2 capsules every 8 hours as needed for your cough.   From your responses in the eVisit questionnaire you describe inflammation in the upper respiratory tract which is causing a significant cough.  This is commonly called Bronchitis and has four common causes:   Allergies Viral Infections Acid Reflux Bacterial Infection Allergies, viruses and acid reflux are treated by controlling symptoms or eliminating the cause. An example might be a cough caused by taking certain blood pressure medications. You stop the cough by changing the medication. Another example might be a cough caused by acid reflux. Controlling the reflux helps control the cough.  USE OF BRONCHODILATOR ("RESCUE") INHALERS: There is a risk from using your bronchodilator too frequently.  The risk is that over-reliance on a medication which only relaxes the muscles surrounding the breathing tubes can reduce the effectiveness of medications prescribed to reduce swelling and congestion of the tubes themselves.  Although you feel brief relief from the bronchodilator inhaler, your asthma may actually be worsening with the tubes becoming more swollen and filled with mucus.  This can delay other  crucial treatments, such as oral steroid medications. If you need to use a bronchodilator inhaler daily, several times per day, you should discuss this with your provider.  There are probably better treatments that could be used to keep your asthma under control.     HOME CARE Only take medications as instructed by your medical team. Complete the entire course of an antibiotic. Drink plenty of fluids and get plenty of rest. Avoid close contacts especially the very young and the elderly Cover your mouth if you cough or cough into your sleeve. Always remember to wash your hands A steam or ultrasonic humidifier can help congestion.   GET HELP RIGHT AWAY IF: You develop worsening fever. You become short of breath You cough up blood. Your symptoms persist after you have completed your treatment plan MAKE SURE YOU  Understand these instructions. Will watch your condition. Will get help right away if you are not doing well or get worse.    Thank you for choosing an e-visit.  Your e-visit answers were reviewed by a board certified advanced clinical practitioner to complete your personal care plan. Depending upon the condition, your plan could have included both over the counter or prescription medications.  Please review your pharmacy choice. Make sure the pharmacy is open so you can pick up prescription now. If there is a problem, you may contact your provider through MyChart messaging and have the prescription routed to another pharmacy.  Your safety is important to us. If you have drug allergies check your prescription carefully.   For the next 24 hours you can use   MyChart to ask questions about today's visit, request a non-urgent call back, or ask for a work or school excuse. You will get an email in the next two days asking about your experience. I hope that your e-visit has been valuable and will speed your recovery.  I provided 5 minutes of non face-to-face time during this encounter  for chart review and documentation.   

## 2021-11-05 ENCOUNTER — Telehealth: Payer: No Typology Code available for payment source | Admitting: Family Medicine

## 2021-11-05 DIAGNOSIS — R3989 Other symptoms and signs involving the genitourinary system: Secondary | ICD-10-CM | POA: Diagnosis not present

## 2021-11-05 MED ORDER — NITROFURANTOIN MONOHYD MACRO 100 MG PO CAPS: 100.0000 mg | ORAL_CAPSULE | Freq: Two times a day (BID) | ORAL | 0 refills | Status: AC

## 2021-11-05 NOTE — Progress Notes (Signed)

## 2021-11-14 ENCOUNTER — Ambulatory Visit (INDEPENDENT_AMBULATORY_CARE_PROVIDER_SITE_OTHER): Payer: No Typology Code available for payment source | Admitting: Nurse Practitioner

## 2021-11-14 ENCOUNTER — Encounter: Payer: Self-pay | Admitting: Nurse Practitioner

## 2021-11-14 VITALS — BP 116/72 | HR 81 | Temp 97.1°F | Ht 66.0 in | Wt 195.4 lb

## 2021-11-14 DIAGNOSIS — R350 Frequency of micturition: Secondary | ICD-10-CM | POA: Diagnosis not present

## 2021-11-14 DIAGNOSIS — B009 Herpesviral infection, unspecified: Secondary | ICD-10-CM | POA: Diagnosis not present

## 2021-11-14 DIAGNOSIS — R739 Hyperglycemia, unspecified: Secondary | ICD-10-CM

## 2021-11-14 DIAGNOSIS — G4452 New daily persistent headache (NDPH): Secondary | ICD-10-CM | POA: Diagnosis not present

## 2021-11-14 LAB — POCT URINALYSIS DIPSTICK OB
Bilirubin, UA: NEGATIVE
Blood, UA: NEGATIVE
Glucose, UA: NEGATIVE
Ketones, UA: NEGATIVE
Leukocytes, UA: NEGATIVE
Nitrite, UA: NEGATIVE
POC,PROTEIN,UA: NEGATIVE
Spec Grav, UA: 1.02 (ref 1.010–1.025)
Urobilinogen, UA: 0.2 E.U./dL
pH, UA: 6 (ref 5.0–8.0)

## 2021-11-14 LAB — BASIC METABOLIC PANEL
BUN: 9 mg/dL (ref 6–23)
CO2: 27 mEq/L (ref 19–32)
Calcium: 8.8 mg/dL (ref 8.4–10.5)
Chloride: 105 mEq/L (ref 96–112)
Creatinine, Ser: 0.77 mg/dL (ref 0.40–1.20)
GFR: 93.67 mL/min (ref >60.00)
Glucose, Bld: 95 mg/dL (ref 70–99)
Potassium: 4 mEq/L (ref 3.5–5.1)
Sodium: 137 mEq/L (ref 135–145)

## 2021-11-14 LAB — CBC
HCT: 38 % (ref 36.0–46.0)
Hemoglobin: 12.8 g/dL (ref 12.0–15.0)
MCHC: 33.8 g/dL (ref 30.0–36.0)
MCV: 85 fl (ref 78.0–100.0)
Platelets: 375 10*3/uL (ref 150.0–400.0)
RBC: 4.47 Mil/uL (ref 3.87–5.11)
RDW: 14.5 % (ref 11.5–15.5)
WBC: 5.2 10*3/uL (ref 4.0–10.5)

## 2021-11-14 LAB — SEDIMENTATION RATE: Sed Rate: 31 mm/hr — ABNORMAL HIGH (ref 0–20)

## 2021-11-14 LAB — HEMOGLOBIN A1C: Hgb A1c MFr Bld: 6.1 % (ref 4.6–6.5)

## 2021-11-14 LAB — C-REACTIVE PROTEIN: CRP: <1 mg/dL (ref 0.5–20.0)

## 2021-11-14 MED ORDER — VALACYCLOVIR HCL 500 MG PO TABS: 500.0000 mg | ORAL_TABLET | Freq: Two times a day (BID) | ORAL | 3 refills | Status: DC

## 2021-11-14 NOTE — Assessment & Plan Note (Signed)
Onset 61month ago, has daily Headache associated with fatigue, dizziness, and nausea, No light or sound sensitivity. Describes as throbbing, moves from temporal to occipital, Occurs randomly, no specific trigger Use of tyleonol 6592m2tabs with  Some improvement Last eye exam with in last 75m59monthuse of contact lens.  Get head CT, cbc, bmp, esr, and crp Advised to use excedrin for headache

## 2021-11-14 NOTE — Patient Instructions (Signed)
Go to lab Use Excedrin migraine, take with food  General Headache Without Cause A headache is pain or discomfort felt around the head or neck area. There are many causes and types of headaches. A few common types include: Tension headaches. Migraine headaches. Cluster headaches. Chronic daily headaches. Sometimes, the specific cause of a headache may not be found. Follow these instructions at home: Watch your condition for any changes. Let your health care provider know about them. Take these steps to help with your condition: Managing pain     Take over-the-counter and prescription medicines only as told by your health care provider. Treatment may include medicines for pain that are taken by mouth or applied to the skin. Lie down in a dark, quiet room when you have a headache. Keep lights dim if bright lights bother you or make your headaches worse. If directed, put ice on your head and neck area: Put ice in a plastic bag. Place a towel between your skin and the bag. Leave the ice on for 20 minutes, 2-3 times per day. Remove the ice if your skin turns bright red. This is very important. If you cannot feel pain, heat, or cold, you have a greater risk of damage to the area. If directed, apply heat to the affected area. Use the heat source that your health care provider recommends, such as a moist heat pack or a heating pad. Place a towel between your skin and the heat source. Leave the heat on for 20-30 minutes. Remove the heat if your skin turns bright red. This is especially important if you are unable to feel pain, heat, or cold. You have a greater risk of getting burned. Eating and drinking Eat meals on a regular schedule. If you drink alcohol: Limit how much you have to: 0-1 drink a day for women who are not pregnant. 0-2 drinks a day for men. Know how much alcohol is in a drink. In the [place] one drink equals one 12 oz bottle of beer (355 mL), one 5 oz glass of wine (148 mL), or  one 1 oz glass of hard liquor (44 mL). Stop drinking caffeine, or decrease the amount of caffeine you drink. Drink enough fluid to keep your urine pale yellow. General instructions  Keep a headache journal to help find out what may trigger your headaches. For example, write down: What you eat and drink. How much sleep you get. Any change to your diet or medicines. Try massage or other relaxation techniques. Limit stress. Sit up straight, and do not tense your muscles. Do not use any products that contain nicotine or tobacco. These products include cigarettes, chewing tobacco, and vaping devices, such as e-cigarettes. If you need help quitting, ask your health care provider. Exercise regularly as told by your health care provider. Sleep on a regular schedule. Get 7-9 hours of sleep each night, or the amount recommended by your health care provider. Keep all follow-up visits. This is important. Contact a health care provider if: Medicine does not help your symptoms. You have a headache that is different from your usual headache. You have nausea or you vomit. You have a fever. Get help right away if: Your headache: Becomes severe quickly. Gets worse after moderate to intense physical activity. You have any of these symptoms: Repeated vomiting. Pain or stiffness in your neck. Changes to your vision. Pain in an eye or ear. Problems with speech. Muscular weakness or loss of muscle control. Loss of balance or coordination. You  feel faint or pass out. You have confusion. You have a seizure. These symptoms may represent a serious problem that is an emergency. Do not wait to see if the symptoms will go away. Get medical help right away. Call your local emergency services (911 in the [place] Do not drive yourself to the hospital. Summary A headache is pain or discomfort felt around the head or neck area. There are many causes and types of headaches. In some cases, the cause may not be  found. Keep a headache journal to help find out what may trigger your headaches. Watch your condition for any changes. Let your health care provider know about them. Contact a health care provider if you have a headache that is different from the usual headache, or if your symptoms are not helped by medicine. Get help right away if your headache becomes severe, you vomit, you have a loss of vision, you lose your balance, or you have a seizure. This information is not intended to replace advice given to you by your health care provider. Make sure you discuss any questions you have with your health care provider. Document Revised: 06/27/2020 Document Reviewed: 06/27/2020 Elsevier Patient Education  2023 [employer].

## 2021-11-14 NOTE — Progress Notes (Signed)
Established Patient Visit  Patient: Janet [name]   DOB: [birth year]   46 y.o. Female  MRN: [id number] Visit Date: 11/14/2021  Subjective:    Chief Complaint  Patient presents with   Acute Visit    Acute visit Urine frequency/fatigue Requesting blood work Fasting Refill -Valtrex    HPI New daily persistent headache Onset 39month ago, has daily Headache associated with fatigue, dizziness, and nausea, No light or sound sensitivity. Describes as throbbing, moves from temporal to occipital, Occurs randomly, no specific trigger Use of tyleonol 65629m2tabs with  Some improvement Last eye exam with in last 29m24monthuse of contact lens.  Get head CT, cbc, bmp, esr, and crp Advised to use excedrin for headache  Janet [name] reports Blood in urine, urinary urgency, pelvic pressure last week. She was prescribed macrobid x 5days by another provider. She completed 4.5days. her symptoms has resolved with oral abx. LMP 10/19/2021  Reviewed medical, surgical, and social history today  Medications: Outpatient Medications Prior to Visit  Medication Sig   benzonatate (TESSALON) 100 MG capsule Take 1 capsule (100 mg total) by mouth 3 (three) times daily as needed.   cholecalciferol (VITAMIN D3) 25 MCG (1000 UNIT) tablet 1 tablet   methimazole (TAPAZOLE) 5 MG tablet Take 1 tablet (5 mg total) by mouth daily.   [DISCONTINUED] valACYclovir (VALTREX) 500 MG tablet Take 1 tablet (500 mg total) by mouth 2 (two) times daily. Take one tablet BID at onset of symptoms for 3 days.   omeprazole (PRILOSEC) 20 MG capsule Take 1 capsule (20 mg total) by mouth daily. (Patient not taking: Reported on 11/14/2021)   No facility-administered medications prior to visit.   Reviewed past medical and social history.   ROS per HPI above      Objective:  BP 116/72 (BP Location: Right Arm, Patient Position: Sitting, Cuff Size: Normal)   Pulse 81   Temp (!) 97.1 F (36.2 C) (Temporal)   Ht '5\' 6"'   (1.676 m)   Wt 195 lb 6.4 oz (88.6 kg)   SpO2 99%   BMI 31.54 kg/m      Physical Exam HENT:     Right Ear: Tympanic membrane, ear canal and external ear normal.     Left Ear: Tympanic membrane, ear canal and external ear normal.  Eyes:     Extraocular Movements: Extraocular movements intact.     Conjunctiva/sclera: Conjunctivae normal.     Pupils: Pupils are equal, round, and reactive to light.  Cardiovascular:     Rate and Rhythm: Normal rate and regular rhythm.     Pulses: Normal pulses.     Heart sounds: Normal heart sounds.  Pulmonary:     Effort: Pulmonary effort is normal.     Breath sounds: Normal breath sounds.  Abdominal:     General: Bowel sounds are normal. There is no distension.     Palpations: Abdomen is soft.     Tenderness: There is no abdominal tenderness. There is no right CVA tenderness, left CVA tenderness or guarding.  Musculoskeletal:     Cervical back: Normal range of motion and neck supple.  Lymphadenopathy:     Cervical: No cervical adenopathy.  Neurological:     Mental Status: She is alert and oriented to person, place, and time.     Cranial Nerves: No cranial nerve deficit.  Psychiatric:        Mood and Affect: Mood normal.  Behavior: Behavior normal.        Thought Content: Thought content normal.     Results for orders placed or performed in visit on 11/14/21  CBC  Result Value Ref Range   WBC 5.2 4.0 - 10.5 K/uL   RBC 4.47 3.87 - 5.11 Mil/uL   Platelets 375.0 150.0 - 400.0 K/uL   Hemoglobin 12.8 12.0 - 15.0 g/dL   HCT 38.0 36.0 - 46.0 %   MCV 85.0 78.0 - 100.0 fl   MCHC 33.8 30.0 - 36.0 g/dL   RDW 14.5 11.5 - 67.5 %  Basic metabolic panel  Result Value Ref Range   Sodium 137 135 - 145 mEq/L   Potassium 4.0 3.5 - 5.1 mEq/L   Chloride 105 96 - 112 mEq/L   CO2 27 19 - 32 mEq/L   Glucose, Bld 95 70 - 99 mg/dL   BUN 9 6 - 23 mg/dL   Creatinine, Ser 0.77 0.40 - 1.20 mg/dL   GFR 93.67 >60.00 mL/min   Calcium 8.8 8.4 - 10.5  mg/dL  Hemoglobin A1c  Result Value Ref Range   Hgb A1c MFr Bld 6.1 4.6 - 6.5 %  Sedimentation rate  Result Value Ref Range   Sed Rate 31 (H) 0 - 20 mm/hr  C-reactive protein  Result Value Ref Range   CRP <1.0 0.5 - 20.0 mg/dL  POC Urinalysis Dipstick OB  Result Value Ref Range   Color, UA pale    Clarity, UA dark    Glucose, UA Negative Negative   Bilirubin, UA negative    Ketones, UA negative    Spec Grav, UA 1.020 1.010 - 1.025   Blood, UA Negative    pH, UA 6.0 5.0 - 8.0   POC,PROTEIN,UA Negative Negative, Trace, Small (1+), Moderate (2+), Large (3+), 4+   Urobilinogen, UA 0.2 0.2 or 1.0 E.U./dL   Nitrite, UA Negative    Leukocytes, UA Negative Negative   Appearance dark    Odor none       Assessment & Plan:    Problem List Items Addressed This Visit       Other   Hyperglycemia   Relevant Orders   Hemoglobin A1c (Completed)   New daily persistent headache - Primary    Onset 69month ago, has daily Headache associated with fatigue, dizziness, and nausea, No light or sound sensitivity. Describes as throbbing, moves from temporal to occipital, Occurs randomly, no specific trigger Use of tyleonol 65650m2tabs with  Some improvement Last eye exam with in last 50m550monthuse of contact lens.  Get head CT, cbc, bmp, esr, and crp Advised to use excedrin for headache      Relevant Orders   CBC (Completed)   Basic metabolic panel (Completed)   CT HEAD WO CONTRAST (5MM)   Sedimentation rate (Completed)   C-reactive protein (Completed)   Other Visit Diagnoses     Frequent urination       Relevant Orders   POC Urinalysis Dipstick OB (Completed)   Hemoglobin A1c (Completed)   HSV (herpes simplex virus) infection       Relevant Medications   valACYclovir (VALTREX) 500 MG tablet      Return if symptoms worsen or fail to improve.     Janet [name]

## 2021-11-15 ENCOUNTER — Telehealth: Payer: Self-pay | Admitting: Nurse Practitioner

## 2021-11-15 NOTE — Telephone Encounter (Signed)
Caller Name: [facility] Call back phone #: [phone]  Reason for Call: There was a form faxed to [facility] for this pt however it was cut off. Please resend to direct fax [phone]

## 2021-11-19 ENCOUNTER — Telehealth: Payer: Self-pay | Admitting: Nurse Practitioner

## 2021-11-19 NOTE — Telephone Encounter (Signed)
Katie from [facility] called and said that they received a fax for CT Scan but it was cut off so they would like for you to refax it please at [phone] and if any questions you can call [phone]

## 2021-11-23 ENCOUNTER — Other Ambulatory Visit: Payer: Self-pay | Admitting: Nurse Practitioner

## 2021-11-23 DIAGNOSIS — B009 Herpesviral infection, unspecified: Secondary | ICD-10-CM

## 2021-11-25 NOTE — Telephone Encounter (Signed)
Faxed over CT order to [facility] @ [phone].  Dm/cma

## 2021-11-25 NOTE — Telephone Encounter (Signed)
Faxed over CT order to [facility] @ [phone].  Dm/cma  

## 2021-11-25 NOTE — Telephone Encounter (Signed)
Chart supports Rx Last OV: 11/2021 Next OV: not scheduled   

## 2022-04-03 ENCOUNTER — Ambulatory Visit: Payer: No Typology Code available for payment source | Admitting: Internal Medicine

## 2022-07-04 ENCOUNTER — Other Ambulatory Visit: Payer: Self-pay | Admitting: Nurse Practitioner

## 2022-07-04 DIAGNOSIS — Z0000 Encounter for general adult medical examination without abnormal findings: Secondary | ICD-10-CM

## 2022-08-01 ENCOUNTER — Ambulatory Visit
Admission: RE | Admit: 2022-08-01 | Discharge: 2022-08-01 | Disposition: A | Discharge status: Home / Self Care | Payer: No Typology Code available for payment source | Source: Ambulatory Visit | Attending: Nurse Practitioner

## 2022-08-01 ENCOUNTER — Encounter: Payer: Self-pay | Admitting: Nurse Practitioner

## 2022-08-01 ENCOUNTER — Ambulatory Visit (INDEPENDENT_AMBULATORY_CARE_PROVIDER_SITE_OTHER): Payer: No Typology Code available for payment source | Admitting: Nurse Practitioner

## 2022-08-01 VITALS — BP 122/80 | HR 88 | Temp 98.4°F | Ht 65.5 in | Wt 195.8 lb

## 2022-08-01 DIAGNOSIS — Z0000 Encounter for general adult medical examination without abnormal findings: Secondary | ICD-10-CM

## 2022-08-01 DIAGNOSIS — M545 Low back pain, unspecified: Secondary | ICD-10-CM

## 2022-08-01 DIAGNOSIS — G8929 Other chronic pain: Secondary | ICD-10-CM

## 2022-08-01 MED ORDER — CYCLOBENZAPRINE HCL 5 MG PO TABS: 5.0000 mg | ORAL_TABLET | Freq: Every day | ORAL | 0 refills | Status: DC

## 2022-08-01 MED ORDER — NAPROXEN 500 MG PO TABS: 500.0000 mg | ORAL_TABLET | Freq: Every day | ORAL | 0 refills | Status: DC

## 2022-08-01 NOTE — Patient Instructions (Addendum)
Ok to use tylenol 650mg  as needed with naproxen 500mg  Avoid all NSAIDs OVER THE COUNTER while taking naproxen 500mg  Start back exercise while waiting for PT appointment.  Low Back Sprain or Strain Rehab Ask your health care provider which exercises are safe for you. Do exercises exactly as told by your health care provider and adjust them as directed. It is normal to feel mild stretching, pulling, tightness, or discomfort as you do these exercises. Stop right away if you feel sudden pain or your pain gets worse. Do not begin these exercises until told by your health care provider. Stretching and range-of-motion exercises These exercises warm up your muscles and joints and improve the movement and flexibility of your back. These exercises also help to relieve pain, numbness, and tingling. Lumbar rotation  Lie on your back on a firm bed or the floor with your knees bent. Straighten your arms out to your sides so each arm forms a 90-degree angle (right angle) with a side of your body. Slowly move (rotate) both of your knees to one side of your body until you feel a stretch in your lower back (lumbar). Try not to let your shoulders lift off the floor. Hold this position for __________ seconds. Tense your abdominal muscles and slowly move your knees back to the starting position. Repeat this exercise on the other side of your body. Repeat __________ times. Complete this exercise __________ times a day. Single knee to chest  Lie on your back on a firm bed or the floor with both legs straight. Bend one of your knees. Use your hands to move your knee up toward your chest until you feel a gentle stretch in your lower back and buttock. Hold your leg in this position by holding on to the front of your knee. Keep your other leg as straight as possible. Hold this position for __________ seconds. Slowly return to the starting position. Repeat with your other leg. Repeat __________ times. Complete this  exercise __________ times a day. Prone extension on elbows  Lie on your abdomen on a firm bed or the floor (prone position). Prop yourself up on your elbows. Use your arms to help lift your chest up until you feel a gentle stretch in your abdomen and your lower back. This will place some of your body weight on your elbows. If this is uncomfortable, try stacking pillows under your chest. Your hips should stay down, against the surface that you are lying on. Keep your hip and back muscles relaxed. Hold this position for __________ seconds. Slowly relax your upper body and return to the starting position. Repeat __________ times. Complete this exercise __________ times a day. Strengthening exercises These exercises build strength and endurance in your back. Endurance is the ability to use your muscles for a long time, even after they get tired. Pelvic tilt This exercise strengthens the muscles that lie deep in the abdomen. Lie on your back on a firm bed or the floor with your legs extended. Bend your knees so they are pointing toward the ceiling and your feet are flat on the floor. Tighten your lower abdominal muscles to press your lower back against the floor. This motion will tilt your pelvis so your tailbone points up toward the ceiling instead of pointing to your feet or the floor. To help with this exercise, you may place a small towel under your lower back and try to push your back into the towel. Hold this position for __________ seconds. Let  your muscles relax completely before you repeat this exercise. Repeat __________ times. Complete this exercise __________ times a day. Alternating arm and leg raises  Get on your hands and knees on a firm surface. If you are on a hard floor, you may want to use padding, such as an exercise mat, to cushion your knees. Line up your arms and legs. Your hands should be directly below your shoulders, and your knees should be directly below your  hips. Lift your left leg behind you. At the same time, raise your right arm and straighten it in front of you. Do not lift your leg higher than your hip. Do not lift your arm higher than your shoulder. Keep your abdominal and back muscles tight. Keep your hips facing the ground. Do not arch your back. Keep your balance carefully, and do not hold your breath. Hold this position for __________ seconds. Slowly return to the starting position. Repeat with your right leg and your left arm. Repeat __________ times. Complete this exercise __________ times a day. Abdominal set with straight leg raise  Lie on your back on a firm bed or the floor. Bend one of your knees and keep your other leg straight. Tense your abdominal muscles and lift your straight leg up, 4-6 inches (10-15 cm) off the ground. Keep your abdominal muscles tight and hold this position for __________ seconds. Do not hold your breath. Do not arch your back. Keep it flat against the ground. Keep your abdominal muscles tense as you slowly lower your leg back to the starting position. Repeat with your other leg. Repeat __________ times. Complete this exercise __________ times a day. Single leg lower with bent knees Lie on your back on a firm bed or the floor. Tense your abdominal muscles and lift your feet off the floor, one foot at a time, so your knees and hips are bent in 90-degree angles (right angles). Your knees should be over your hips and your lower legs should be parallel to the floor. Keeping your abdominal muscles tense and your knee bent, slowly lower one of your legs so your toe touches the ground. Lift your leg back up to return to the starting position. Do not hold your breath. Do not let your back arch. Keep your back flat against the ground. Repeat with your other leg. Repeat __________ times. Complete this exercise __________ times a day. Posture and body mechanics Good posture and healthy body mechanics can  help to relieve stress in your body's tissues and joints. Body mechanics refers to the movements and positions of your body while you do your daily activities. Posture is part of body mechanics. Good posture means: Your spine is in its natural S-curve position (neutral). Your shoulders are pulled back slightly. Your head is not tipped forward (neutral). Follow these guidelines to improve your posture and body mechanics in your everyday activities. Standing  When standing, keep your spine neutral and your feet about hip-width apart. Keep a slight bend in your knees. Your ears, shoulders, and hips should line up. When you do a task in which you stand in one place for a long time, place one foot up on a stable object that is 2-4 inches (5-10 cm) high, such as a footstool. This helps keep your spine neutral. Sitting  When sitting, keep your spine neutral and keep your feet flat on the floor. Use a footrest, if necessary, and keep your thighs parallel to the floor. Avoid rounding your shoulders, and avoid  tilting your head forward. When working at a desk or a computer, keep your desk at a height where your hands are slightly lower than your elbows. Slide your chair under your desk so you are close enough to maintain good posture. When working at a computer, place your monitor at a height where you are looking straight ahead and you do not have to tilt your head forward or downward to look at the screen. Resting When lying down and resting, avoid positions that are most painful for you. If you have pain with activities such as sitting, bending, stooping, or squatting, lie in a position in which your body does not bend very much. For example, avoid curling up on your side with your arms and knees near your chest (fetal position). If you have pain with activities such as standing for a long time or reaching with your arms, lie with your spine in a neutral position and bend your knees slightly. Try the  following positions: Lying on your side with a pillow between your knees. Lying on your back with a pillow under your knees. Lifting  When lifting objects, keep your feet at least shoulder-width apart and tighten your abdominal muscles. Bend your knees and hips and keep your spine neutral. It is important to lift using the strength of your legs, not your back. Do not lock your knees straight out. Always ask for help to lift heavy or awkward objects. This information is not intended to replace advice given to you by your health care provider. Make sure you discuss any questions you have with your health care provider. Document Revised: 04/16/2020 Document Reviewed: 04/16/2020 Elsevier Patient Education  2024 [employer].

## 2022-08-01 NOTE — Progress Notes (Signed)
Established Patient Visit  Patient: Janet [name]   DOB: [birth year]   47 y.o. Female  MRN: [id number] Visit Date: 08/01/2022  Subjective:    Chief Complaint  Patient presents with   Back Pain    On going pain and continues to get worse   Chronic bilateral low back pain without sciatica Onset 2022 Waxing and waning No weakness, no paresthesia, no change in GI/GU function Worse with prolonged standing or repetitive bending. No hx of back injury or surgery. Dg lumbar spine 2022: No acute lumbar spine abnormalities. Minimal levoconvex lumbar scoliosis. CT ABDOMEN/pelvis 2023:No acute or significant osseous findings.   Pain due to poor posture and weak core muscles Advised about need for PT, use of an NSAID and muscle relaxant Re eval after completion of PT Consider MRI lumbar sine if no improvement  Wt Readings from Last 3 Encounters:  08/01/22 195 lb 12.8 oz (88.8 kg)  11/14/21 195 lb 6.4 oz (88.6 kg)  10/01/21 192 lb (87.1 kg)    Reviewed medical, surgical, and social history today  Medications: Outpatient Medications Prior to Visit  Medication Sig   benzonatate (TESSALON) 100 MG capsule Take 1 capsule (100 mg total) by mouth 3 (three) times daily as needed.   BISACODYL 5 MG EC tablet Take 10 mg by mouth 2 (two) times daily.   cholecalciferol (VITAMIN D3) 25 MCG (1000 UNIT) tablet 1 tablet   GAVILYTE-G 236 g solution once.   methimazole (TAPAZOLE) 5 MG tablet Take 1 tablet (5 mg total) by mouth daily.   valACYclovir (VALTREX) 500 MG tablet Take 1 tablet (500 mg total) by mouth 2 (two) times daily. Take one tablet BID at onset of symptoms for 3 days.   [DISCONTINUED] omeprazole (PRILOSEC) 20 MG capsule Take 1 capsule (20 mg total) by mouth daily. (Patient not taking: Reported on 11/14/2021)   No facility-administered medications prior to visit.   Reviewed past medical and social history.   ROS per HPI above      Objective:  BP 122/80   Pulse 88    Temp 98.4 F (36.9 C) (Temporal)   Ht 5' 5.5" (1.664 m)   Wt 195 lb 12.8 oz (88.8 kg)   LMP 07/04/2022   SpO2 98%   BMI 32.09 kg/m      Physical Exam Vitals and nursing note reviewed.  Cardiovascular:     Rate and Rhythm: Normal rate.     Pulses: Normal pulses.  Pulmonary:     Effort: Pulmonary effort is normal.  Musculoskeletal:     Thoracic back: Normal.     Lumbar back: Tenderness present. No swelling, edema, deformity, signs of trauma, lacerations, spasms or bony tenderness. Normal range of motion. Positive left straight leg raise test. Negative right straight leg raise test.     Right hip: Normal.     Left hip: Normal.     Right upper leg: Normal.     Left upper leg: Normal.     Right knee: Normal.     Left knee: Normal.     Right lower leg: Normal. No edema.     Left lower leg: Normal. No edema.  Skin:    General: Skin is warm and dry.     Findings: No rash.  Neurological:     Mental Status: She is alert and oriented to person, place, and time.     No results found for any visits  on 08/01/22.    Assessment & Plan:    Problem List Items Addressed This Visit       Other   Chronic bilateral low back pain without sciatica - Primary    Onset 2022 Waxing and waning No weakness, no paresthesia, no change in GI/GU function Worse with prolonged standing or repetitive bending. No hx of back injury or surgery. Dg lumbar spine 2022: No acute lumbar spine abnormalities. Minimal levoconvex lumbar scoliosis. CT ABDOMEN/pelvis 2023:No acute or significant osseous findings.   Pain due to poor posture and weak core muscles Advised about need for PT, use of an NSAID and muscle relaxant Re eval after completion of PT Consider MRI lumbar sine if no improvement      Relevant Medications   naproxen (NAPROSYN) 500 MG tablet   cyclobenzaprine (FLEXERIL) 5 MG tablet   Other Relevant Orders   Ambulatory referral to Physical Therapy   Return in about 4 weeks (around  08/29/2022) for CPE (fasting).     Alysia Penna, NP

## 2022-08-01 NOTE — Assessment & Plan Note (Addendum)
Onset 2022 Waxing and waning No weakness, no paresthesia, no change in GI/GU function Worse with prolonged standing or repetitive bending. No hx of back injury or surgery. Dg lumbar spine 2022: No acute lumbar spine abnormalities. Minimal levoconvex lumbar scoliosis. CT ABDOMEN/pelvis 2023:No acute or significant osseous findings.   Pain due to poor posture and weak core muscles Advised about need for PT, use of an NSAID and muscle relaxant Re eval after completion of PT Consider MRI lumbar sine if no improvement

## 2022-09-12 ENCOUNTER — Encounter: Payer: No Typology Code available for payment source | Admitting: Nurse Practitioner

## 2022-09-24 ENCOUNTER — Other Ambulatory Visit (HOSPITAL_COMMUNITY): Payer: Self-pay

## 2022-09-24 ENCOUNTER — Telehealth: Payer: Self-pay | Admitting: Nurse Practitioner

## 2022-09-24 ENCOUNTER — Other Ambulatory Visit: Payer: Self-pay

## 2022-09-24 DIAGNOSIS — B009 Herpesviral infection, unspecified: Secondary | ICD-10-CM

## 2022-09-24 MED ORDER — VALACYCLOVIR HCL 500 MG PO TABS
500.0000 mg | ORAL_TABLET | Freq: Two times a day (BID) | ORAL | 3 refills | Status: DC
  Filled 2022-09-24 – 2023-06-17 (×3): qty 30, 15d supply, fill #0

## 2022-09-24 NOTE — Telephone Encounter (Signed)
Prescription Request  09/24/2022  LOV: 08/01/2022  What is the name of the medication or equipment? valACYclovir valACYclovir (VALTREX) 500 MG tablet  Have you contacted your pharmacy to request a refill? Yes   Which pharmacy would you like this sent to?  [id number] [place], [place] - [address] [place] [address] Phone: [phone] Fax: [phone]    Patient notified that their request is being sent to the clinical staff for review and that they should receive a response within 2 business days.   Please advise at Mobile [phone] (mobile)

## 2022-09-25 ENCOUNTER — Encounter (INDEPENDENT_AMBULATORY_CARE_PROVIDER_SITE_OTHER): Payer: Self-pay

## 2022-10-07 ENCOUNTER — Other Ambulatory Visit (HOSPITAL_COMMUNITY): Payer: Self-pay

## 2022-10-09 ENCOUNTER — Other Ambulatory Visit (HOSPITAL_COMMUNITY): Payer: Self-pay

## 2022-10-10 ENCOUNTER — Ambulatory Visit (INDEPENDENT_AMBULATORY_CARE_PROVIDER_SITE_OTHER): Payer: No Typology Code available for payment source | Admitting: Internal Medicine

## 2022-10-10 ENCOUNTER — Encounter: Payer: Self-pay | Admitting: Internal Medicine

## 2022-10-10 VITALS — BP 118/72 | HR 79 | Ht 65.5 in | Wt 191.0 lb

## 2022-10-10 DIAGNOSIS — E010 Iodine-deficiency related diffuse (endemic) goiter: Secondary | ICD-10-CM

## 2022-10-10 DIAGNOSIS — E0590 Thyrotoxicosis, unspecified without thyrotoxic crisis or storm: Secondary | ICD-10-CM | POA: Diagnosis not present

## 2022-10-10 LAB — T4, FREE: Free T4: 0.99 ng/dL (ref 0.60–1.60)

## 2022-10-10 LAB — TSH: TSH: 2.29 u[IU]/mL (ref 0.35–5.50)

## 2022-10-10 MED ORDER — METHIMAZOLE 5 MG PO TABS
5.0000 mg | ORAL_TABLET | Freq: Every day | ORAL | 3 refills | Status: DC
Start: 2022-10-10 — End: 2023-01-19

## 2022-10-10 NOTE — Progress Notes (Signed)
Name: Janet [name]  MRN/ DOB: [id number], 02-Oct-1975    Age/ Sex: 47 y.o., female     PCP: Anne Ng, NP   Reason for Endocrinology Evaluation: Hyperthyroidism     Initial Endocrinology Clinic Visit: 02/27/2012    PATIENT IDENTIFIER: Janet [name] is a 47 y.o., female with a past medical history of hyperthyroidism. She has followed with [facility] since 02/27/2012 for consultative assistance with management of her hyperthyroidism.   HISTORICAL SUMMARY: The patient was first diagnosed with hyperthyroidism in 2007 during pregnancy.  Thyroid uptake and scan in May 2007 demonstrated diffuse enlargement of the thyroid gland with a multinodular appearance. A discrete toxic nodule is not demonstrated.   Thyroid ultrasound in April 2018 showed normal thyroid with no nodules.  Patient has been on methimazole since her diagnosis  Mother with thyroid disease SUBJECTIVE:    Today (10/10/2022):  [name] is here for hyperthyroidism.  She has not been to our clinic in a year.  Patient has been no weight gain Denies dysphagia  No local neck swelling  Denies palpitations Has occasional tremors  She has constipation     HISTORY:  Past Medical History:  Past Medical History:  Diagnosis Date   Abdominal tenderness 11/25/2012   Back pain 11/25/2012   Dyspnea 04/25/2011   Gastroenteritis 04/16/2017   HYPERSOMNIA 04/10/2010   HYPERTHYROIDISM 08/03/2006   Low vitamin D level 2019   level = 7   Past Surgical History:  Past Surgical History:  Procedure Laterality Date   CESAREAN SECTION     x3   DILATION AND CURETTAGE OF UTERUS     TUBAL LIGATION     Social History:  reports that she has never smoked. She has never used smokeless tobacco. She reports that she does not drink alcohol and does not use drugs. Family History:  Family History  Problem Relation Age of Onset   Hypertension Mother    Hyperlipidemia Mother    Sleep apnea Mother     Thyroid disease Mother    Stroke Mother    Cancer Other        Aunt with Cancer- unknown type   Lupus Other        Uncle   Cancer Other        Lung Cancer-Uncle   Breast cancer Neg Hx      HOME MEDICATIONS: Allergies as of 10/10/2022       Reactions   Sulfa Antibiotics Shortness Of Breath, Swelling        Medication List        Accurate as of October 10, 2022  2:09 PM. If you have any questions, ask your nurse or doctor.          STOP taking these medications    bisacodyl 5 MG EC tablet Generic drug: bisacodyl Stopped by: Johnney Ou Yvette Roark   cyclobenzaprine 5 MG tablet Commonly known as: FLEXERIL Stopped by: Johnney Ou Righteous [employer]orne   GaviLyte-G 236 g solution Generic drug: polyethylene glycol Stopped by: Johnney Ou Jerald Hennington   naproxen 500 MG tablet Commonly known as: Naprosyn Stopped by: Johnney Ou Larenda Reedy       TAKE these medications    benzonatate 100 MG capsule Commonly known as: TESSALON Take 1 capsule (100 mg total) by mouth 3 (three) times daily as needed.   cholecalciferol 25 MCG (1000 UNIT) tablet Commonly known as: VITAMIN D3 1 tablet   methimazole 5 MG tablet Commonly known as: TAPAZOLE Take 1  tablet (5 mg total) by mouth daily.   valACYclovir 500 MG tablet Commonly known as: VALTREX Take 1 tablet (500 mg total) by mouth 2 (two) times daily at onset of symptoms for 3 days.          OBJECTIVE:   PHYSICAL EXAM: VS: BP 118/72 (BP Location: Left Arm, Patient Position: Sitting, Cuff Size: Small)   Pulse 79   Ht 5' 5.5" (1.664 m)   Wt 213 lb (96.6 kg)   SpO2 97%   BMI 34.91 kg/m    EXAM: General: Pt appears well and is in NAD  Eyes: External eye exam normal without stare, lid lag or exophthalmos.  EOM intact.  PERRL.  Neck: General: Supple without adenopathy. Thyroid: Thyroid size normal.  Right thyroid asymmetry noted   Lungs: Clear with good BS bilat with no rales, rhonchi, or wheezes  Heart: Auscultation: RRR.   Abdomen: Normoactive bowel sounds, soft, nontender, without masses or organomegaly palpable  Extremities:  BL LE: No pretibial edema normal ROM and strength.  Mental Status: Judgment, insight: Intact Orientation: Oriented to time, place, and person Mood and affect: No depression, anxiety, or agitation     DATA REVIEWED:  Latest Reference Range & Units 10/10/22 14:29  TSH 0.35 - 5.50 uIU/mL 2.29  T4,Free(Direct) 0.60 - 1.60 ng/dL 8.65     Latest Reference Range & Units 03/14/21 13:43  Sodium 135 - 145 mEq/L 138  Potassium 3.5 - 5.1 mEq/L 4.1  Chloride 96 - 112 mEq/L 103  CO2 19 - 32 mEq/L 29  Glucose 70 - 99 mg/dL 76  BUN 6 - 23 mg/dL 13  Creatinine 7.84 - 6.96 mg/dL 2.95  Calcium 8.4 - 28.4 mg/dL 9.2  Alkaline Phosphatase 39 - 117 U/L 64  Albumin 3.5 - 5.2 g/dL 4.2  Lipase 13.2 - 44.0 U/L 75.0 (H)  AST 0 - 37 U/L 20  ALT 0 - 35 U/L 18  Total Protein 6.0 - 8.3 g/dL 7.6  Total Bilirubin 0.2 - 1.2 mg/dL 0.4  GFR >10.27 mL/min 98.71  Total CHOL/HDL Ratio  3  Cholesterol 0 - 200 mg/dL 253  HDL Cholesterol >66.44 mg/dL 03.47  LDL (calc) 0 - 99 mg/dL 85  NonHDL  42.59  Triglycerides 0.0 - 149.0 mg/dL 56.3  VLDL 0.0 - 87.5 mg/dL 64.3    Latest Reference Range & Units 03/14/21 13:43  WBC 4.0 - 10.5 K/uL 5.3  RBC 3.87 - 5.11 Mil/uL 4.70  Hemoglobin 12.0 - 15.0 g/dL 32.9  HCT 51.8 - 84.1 % 39.7  MCV 78.0 - 100.0 fl 84.6  MCHC 30.0 - 36.0 g/dL 66.0  RDW 63.0 - 16.0 % 14.0  Platelets 150.0 - 400.0 K/uL 386.0  Neutrophils 43.0 - 77.0 % 48.5  Lymphocytes 12.0 - 46.0 % 38.3  Monocytes Relative 3.0 - 12.0 % 6.7  Eosinophil 0.0 - 5.0 % 6.0 (H)  Basophil 0.0 - 3.0 % 0.5  NEUT# 1.4 - 7.7 K/uL 2.6  Lymphocyte # 0.7 - 4.0 K/uL 2.0  Monocyte # 0.1 - 1.0 K/uL 0.4  Eosinophils Absolute 0.0 - 0.7 K/uL 0.3  Basophils Absolute 0.0 - 0.1 K/uL 0.0   ASSESSMENT / PLAN / RECOMMENDATIONS:   Hyperthyroidism:  -Patient is clinically euthyroid -Thyroid uptake and scan in 2007 showed  multinodular goiter, thyroid ultrasound in 2018 did not show any evidence of nodules (of note the patient does not recall having thyroid ultrasound)  -Repeat TFTs are normal, no change Medications   Continue methimazole 5 mg, 1 tablet  daily   2. Thyromegaly :  -She had thyroid ultrasound 2019 that did not show any nodules -She has been noted with right thyroid asymmetry on exam today, will proceed with repeat ultrasound  Follow-up in 6 months   Signed electronically by: Lyndle Herrlich, MD  [facility]  [facility] [address] [place] [place], [place] [address] Phone: [phone] FAX: [phone]      CC: Anne Ng, NP [address] [place] [address] Phone: [phone]  Fax: [phone]   Return to Endocrinology clinic as below: Future Appointments  Date Time Provider Department Center  10/24/2022  1:40 PM Nche, Bonna Gains, NP [id number]

## 2022-10-17 ENCOUNTER — Ambulatory Visit
Admission: RE | Admit: 2022-10-17 | Discharge: 2022-10-17 | Disposition: A | Discharge status: Home / Self Care | Payer: No Typology Code available for payment source | Source: Ambulatory Visit | Attending: Internal Medicine | Admitting: Internal Medicine

## 2022-10-17 DIAGNOSIS — E0590 Thyrotoxicosis, unspecified without thyrotoxic crisis or storm: Secondary | ICD-10-CM

## 2022-10-24 ENCOUNTER — Ambulatory Visit (INDEPENDENT_AMBULATORY_CARE_PROVIDER_SITE_OTHER): Payer: No Typology Code available for payment source | Admitting: Nurse Practitioner

## 2022-10-24 ENCOUNTER — Encounter: Payer: Self-pay | Admitting: Nurse Practitioner

## 2022-10-24 VITALS — BP 110/70 | HR 90 | Temp 97.9°F | Ht 66.0 in | Wt 190.6 lb

## 2022-10-24 DIAGNOSIS — E559 Vitamin D deficiency, unspecified: Secondary | ICD-10-CM

## 2022-10-24 DIAGNOSIS — R7303 Prediabetes: Secondary | ICD-10-CM | POA: Insufficient documentation

## 2022-10-24 DIAGNOSIS — Z136 Encounter for screening for cardiovascular disorders: Secondary | ICD-10-CM

## 2022-10-24 DIAGNOSIS — Z0000 Encounter for general adult medical examination without abnormal findings: Secondary | ICD-10-CM | POA: Diagnosis not present

## 2022-10-24 DIAGNOSIS — Z13220 Encounter for screening for lipoid disorders: Secondary | ICD-10-CM | POA: Diagnosis not present

## 2022-10-24 DIAGNOSIS — Z23 Encounter for immunization: Secondary | ICD-10-CM

## 2022-10-24 NOTE — Progress Notes (Signed)
Complete physical exam  Patient: Janet [name]   DOB: 04-16-1975   47 y.o. Female  MRN: [id number] Visit Date: 10/24/2022  Subjective:    Chief Complaint  Patient presents with   Annual Exam    Fasting CPE and Flu vaccine. She would like a pap if possible.   Janet [name] is a 47 y.o. female who presents today for a complete physical exam. She reports consuming a general diet.  Walking daily-50mins  She generally feels well. She reports sleeping well. She does not have additional problems to discuss today.  Vision:Yes Dental:Yes STD Screen:No UTD with PAP smear 2021- normal with negative HPV, repeat in 39yrs,  Colonoscopy completed by [facility] 07/2022: polyp removed, benign per patient. Report requested  BP Readings from Last 3 Encounters:  10/24/22 110/70  10/10/22 118/72  08/01/22 122/80   Wt Readings from Last 3 Encounters:  10/24/22 190 lb 9.6 oz (86.5 kg)  10/10/22 191 lb (86.6 kg)  08/01/22 195 lb 12.8 oz (88.8 kg)   Most recent fall risk assessment:    10/24/2022    2:01 PM  Fall Risk   Falls in the past year? 0  Number falls in past yr: 0  Injury with Fall? 0  Risk for fall due to : No Fall Risks  Follow up Falls evaluation completed   Depression screen:Yes - No Depression  Most recent depression screenings:    10/24/2022    2:01 PM 08/01/2022    1:37 PM  PHQ 2/9 Scores  PHQ - 2 Score 0 0  PHQ- 9 Score 0    HPI  Constipation Followed by [facility]  Prediabetes Repeat hgbA1c  Vitamin D deficiency Repeat vit D  Past Medical History:  Diagnosis Date   Abdominal tenderness 11/25/2012   Back pain 11/25/2012   Dyspnea 04/25/2011   Gastroenteritis 04/16/2017   HYPERSOMNIA 04/10/2010   HYPERTHYROIDISM 08/03/2006   Low vitamin D level 2019   level = 7   Past Surgical History:  Procedure Laterality Date   CESAREAN SECTION     x3   DILATION AND CURETTAGE OF UTERUS     TUBAL LIGATION     Social History   Socioeconomic History   Marital  status: Married    Spouse name: Not on file   Number of children: 3   Years of education: Not on file   Highest education level: Associate degree: occupational, Scientist, product/process development, or vocational program  Occupational History   Not on file  Tobacco Use   Smoking status: Never   Smokeless tobacco: Never  Vaping Use   Vaping status: Never Used  Substance and Sexual Activity   Alcohol use: No    Alcohol/week: 0.0 standard drinks of alcohol   Drug use: No   Sexual activity: Yes    Partners: Male    Birth control/protection: Surgical    Comment: Tubal  Other Topics Concern   Not on file  Social History Narrative   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   Social Determinants of Health   Financial Resource Strain: Low Risk  (08/01/2022)   Overall Financial Resource Strain (CARDIA)    Difficulty of Paying Living Expenses: Not hard at all  Food Insecurity: No Food Insecurity (08/01/2022)   Hunger Vital Sign    Worried About Running Out of Food in the Last Year: Never true    Ran Out of Food in the Last Year: Never true  Transportation Needs: No Transportation Needs (08/01/2022)   PRAPARE - Transportation  Lack of Transportation (Medical): No    Lack of Transportation (Non-Medical): No  Physical Activity: Insufficiently Active (08/01/2022)   Exercise Vital Sign    Days of Exercise per Week: 1 day    Minutes of Exercise per Session: 20 min  Stress: Stress Concern Present (08/01/2022)   [employer] of Occupational Health - Occupational Stress Questionnaire    Feeling of Stress : Very much  Social Connections: Socially Integrated (08/01/2022)   Social Connection and Isolation Panel [NHANES]    Frequency of Communication with Friends and Family: More than three times a week    Frequency of Social Gatherings with Friends and Family: Once a week    Attends Religious Services: More than 4 times per year    Active Member of [employer] or Organizations: Yes    Attends Engineer, structural: More than 4 times  per year    Marital Status: Married  Catering manager Violence: Not on file   Family Status  Relation Name Status   Mother  Alive   Father gunshot Deceased   Brother  Alive   Brother  Alive   Other  (Not Specified)   Other  (Not Specified)   Other  (Not Specified)   Neg Hx  (Not Specified)  No partnership data on file   Family History  Problem Relation Age of Onset   Hypertension Mother    Hyperlipidemia Mother    Sleep apnea Mother    Thyroid disease Mother    Stroke Mother    Cancer Other        Aunt with Cancer- unknown type   Lupus Other        Uncle   Cancer Other        Lung Cancer-Uncle   Breast cancer Neg Hx    Allergies  Allergen Reactions   Sulfa Antibiotics Shortness Of Breath and Swelling    Patient Care Team: Evanee Lubrano, Bonna Gains, NP as PCP - General (Internal Medicine)   Medications: Outpatient Medications Prior to Visit  Medication Sig   cholecalciferol (VITAMIN D3) 25 MCG (1000 UNIT) tablet 1 tablet   methimazole (TAPAZOLE) 5 MG tablet Take 1 tablet (5 mg total) by mouth daily.   valACYclovir (VALTREX) 500 MG tablet Take 1 tablet (500 mg total) by mouth 2 (two) times daily at onset of symptoms for 3 days.   benzonatate (TESSALON) 100 MG capsule Take 1 capsule (100 mg total) by mouth 3 (three) times daily as needed. (Patient not taking: Reported on 10/10/2022)   No facility-administered medications prior to visit.    Review of Systems  Constitutional:  Negative for activity change, appetite change and unexpected weight change.  Respiratory: Negative.    Cardiovascular: Negative.   Gastrointestinal: Negative.   Endocrine: Negative for cold intolerance and heat intolerance.  Genitourinary: Negative.   Musculoskeletal: Negative.   Skin: Negative.   Neurological: Negative.   Hematological: Negative.   Psychiatric/Behavioral:  Negative for behavioral problems, decreased concentration, dysphoric mood, hallucinations, self-injury, sleep disturbance  and suicidal ideas. The patient is not nervous/anxious.         Objective:  BP 110/70   Pulse 90   Temp 97.9 F (36.6 C) (Temporal)   Ht 5\' 6"  (1.676 m)   Wt 190 lb 9.6 oz (86.5 kg)   SpO2 97%   BMI 30.76 kg/m     Physical Exam Vitals and nursing note reviewed.  Constitutional:      General: She is not in acute distress. HENT:  Right Ear: Tympanic membrane, ear canal and external ear normal.     Left Ear: Tympanic membrane, ear canal and external ear normal.     Nose: Nose normal.  Eyes:     Extraocular Movements: Extraocular movements intact.     Conjunctiva/sclera: Conjunctivae normal.     Pupils: Pupils are equal, round, and reactive to light.  Neck:     Thyroid: No thyroid mass, thyromegaly or thyroid tenderness.  Cardiovascular:     Rate and Rhythm: Normal rate and regular rhythm.     Pulses: Normal pulses.     Heart sounds: Normal heart sounds.  Pulmonary:     Effort: Pulmonary effort is normal.     Breath sounds: Normal breath sounds.  Abdominal:     General: Bowel sounds are normal.     Palpations: Abdomen is soft.  Musculoskeletal:        General: Normal range of motion.     Cervical back: Normal range of motion and neck supple.     Right lower leg: No edema.     Left lower leg: No edema.  Lymphadenopathy:     Cervical: No cervical adenopathy.  Skin:    General: Skin is warm and dry.  Neurological:     Mental Status: She is alert and oriented to person, place, and time.     Cranial Nerves: No cranial nerve deficit.  Psychiatric:        Mood and Affect: Mood normal.        Behavior: Behavior normal.        Thought Content: Thought content normal.      No results found for any visits on 10/24/22.    Assessment & Plan:    Routine Health Maintenance and Physical Exam  Immunization History  Administered Date(s) Administered   Hep A / Hep B 10/25/2013   Hepatitis B, ADULT 04/19/2013, 05/20/2013   Influenza, Seasonal, Injecte, Preservative  Fre 10/24/2022   Influenza,inj,Quad PF,6+ Mos 12/08/2014, 11/15/2015, 11/14/2016, 12/02/2018, 12/12/2019, 12/10/2020   PFIZER(Purple Top)SARS-COV-2 Vaccination 02/24/2019, 03/03/2019, 03/24/2019, 06/09/2020   PPD Test 06/07/2013, 11/22/2014, 07/31/2017   Tdap 04/19/2013    Health Maintenance  Topic Date Due   COVID-19 Vaccine (5 - 2023-24 season) 10/12/2022   Colonoscopy  11/01/2022 (Originally 03/25/2022)   Hepatitis C Screening  01/31/2023 (Originally 03/26/1995)   HIV Screening  01/31/2023 (Originally 03/25/1992)   DTaP/Tdap/Td (2 - Td or Tdap) 04/20/2023   PAP SMEAR-Modifier  08/23/2025   INFLUENZA VACCINE  Completed   HPV VACCINES  Aged Out    Discussed health benefits of physical activity, and encouraged her to engage in regular exercise appropriate for her age and condition.  Problem List Items Addressed This Visit     Prediabetes    Repeat hgbA1c      Relevant Orders   Hemoglobin A1c   Vitamin D deficiency    Repeat vit D      Relevant Orders   VITAMIN D 25 Hydroxy (Vit-D Deficiency, Fractures)   Other Visit Diagnoses     Preventative health care    -  Primary   Relevant Orders   Lipid panel   Comprehensive metabolic panel   Encounter for lipid screening for cardiovascular disease       Relevant Orders   Lipid panel   Immunization due       Relevant Orders   Flu vaccine trivalent PF, 6mos and older(Flulaval,Afluria,Fluarix,Fluzone) (Completed)      Return in about 1 year (around 10/24/2023) for  CPE (fasting).     Alysia Penna, NP

## 2022-10-24 NOTE — Addendum Note (Signed)
Addended by: Lindley Magnus L on: 10/24/2022 04:25 PM   Modules accepted: Orders

## 2022-10-24 NOTE — Assessment & Plan Note (Signed)
Repeat vit. D

## 2022-10-24 NOTE — Assessment & Plan Note (Signed)
Repeat hgbA1c

## 2022-10-24 NOTE — Patient Instructions (Addendum)
Send copy of colonoscopy report via mychart. Go to lab Continue Heart healthy diet and daily exercise. Maintain current medications.  Preventive Care 45-45 Years Old, Female Preventive care refers to lifestyle choices and visits with your health care provider that can promote health and wellness. Preventive care visits are also called wellness exams. What can I expect for my preventive care visit? Counseling Your health care provider may ask you questions about your: Medical history, including: Past medical problems. Family medical history. Pregnancy history. Current health, including: Menstrual cycle. Method of birth control. Emotional well-being. Home life and relationship well-being. Sexual activity and sexual health. Lifestyle, including: Alcohol, nicotine or tobacco, and drug use. Access to firearms. Diet, exercise, and sleep habits. Work and work Astronomer. Sunscreen use. Safety issues such as seatbelt and bike helmet use. Physical exam Your health care provider will check your: Height and weight. These may be used to calculate your BMI (body mass index). BMI is a measurement that tells if you are at a healthy weight. Waist circumference. This measures the distance around your waistline. This measurement also tells if you are at a healthy weight and may help predict your risk of certain diseases, such as type 2 diabetes and high blood pressure. Heart rate and blood pressure. Body temperature. Skin for abnormal spots. What immunizations do I need?  Vaccines are usually given at various ages, according to a schedule. Your health care provider will recommend vaccines for you based on your age, medical history, and lifestyle or other factors, such as travel or where you work. What tests do I need? Screening Your health care provider may recommend screening tests for certain conditions. This may include: Lipid and cholesterol levels. Diabetes screening. This is done by  checking your blood sugar (glucose) after you have not eaten for a while (fasting). Pelvic exam and Pap test. Hepatitis B test. Hepatitis C test. HIV (human immunodeficiency virus) test. STI (sexually transmitted infection) testing, if you are at risk. Lung cancer screening. Colorectal cancer screening. Mammogram. Talk with your health care provider about when you should start having regular mammograms. This may depend on whether you have a family history of breast cancer. BRCA-related cancer screening. This may be done if you have a family history of breast, ovarian, tubal, or peritoneal cancers. Bone density scan. This is done to screen for osteoporosis. Talk with your health care provider about your test results, treatment options, and if necessary, the need for more tests. Follow these instructions at home: Eating and drinking  Eat a diet that includes fresh fruits and vegetables, whole grains, lean protein, and low-fat dairy products. Take vitamin and mineral supplements as recommended by your health care provider. Do not drink alcohol if: Your health care provider tells you not to drink. You are pregnant, may be pregnant, or are planning to become pregnant. If you drink alcohol: Limit how much you have to 0-1 drink a day. Know how much alcohol is in your drink. In the [place] one drink equals one 12 oz bottle of beer (355 mL), one 5 oz glass of wine (148 mL), or one 1 oz glass of hard liquor (44 mL). Lifestyle Brush your teeth every morning and night with fluoride toothpaste. Floss one time each day. Exercise for at least 30 minutes 5 or more days each week. Do not use any products that contain nicotine or tobacco. These products include cigarettes, chewing tobacco, and vaping devices, such as e-cigarettes. If you need help quitting, ask your health care provider.  Do not use drugs. If you are sexually active, practice safe sex. Use a condom or other form of protection to prevent  STIs. If you do not wish to become pregnant, use a form of birth control. If you plan to become pregnant, see your health care provider for a prepregnancy visit. Take aspirin only as told by your health care provider. Make sure that you understand how much to take and what form to take. Work with your health care provider to find out whether it is safe and beneficial for you to take aspirin daily. Find healthy ways to manage stress, such as: Meditation, yoga, or listening to music. Journaling. Talking to a trusted person. Spending time with friends and family. Minimize exposure to UV radiation to reduce your risk of skin cancer. Safety Always wear your seat belt while driving or riding in a vehicle. Do not drive: If you have been drinking alcohol. Do not ride with someone who has been drinking. When you are tired or distracted. While texting. If you have been using any mind-altering substances or drugs. Wear a helmet and other protective equipment during sports activities. If you have firearms in your house, make sure you follow all gun safety procedures. Seek help if you have been physically or sexually abused. What's next? Visit your health care provider once a year for an annual wellness visit. Ask your health care provider how often you should have your eyes and teeth checked. Stay up to date on all vaccines. This information is not intended to replace advice given to you by your health care provider. Make sure you discuss any questions you have with your health care provider. Document Revised: 07/25/2020 Document Reviewed: 07/25/2020 Elsevier Patient Education  2024 [employer].

## 2022-10-24 NOTE — Addendum Note (Signed)
Addended by: Lindley Magnus L on: 10/24/2022 04:23 PM   Modules accepted: Orders

## 2022-10-24 NOTE — Assessment & Plan Note (Signed)
Followed by [facility].

## 2022-10-25 LAB — COMPREHENSIVE METABOLIC PANEL
AG Ratio: 1.6 (calc) (ref 1.0–2.5)
ALT: 15 U/L (ref 6–29)
AST: 12 U/L (ref 10–35)
Albumin: 4.4 g/dL (ref 3.6–5.1)
Alkaline phosphatase (APISO): 66 U/L (ref 31–125)
BUN: 11 mg/dL (ref 7–25)
CO2: 19 mmol/L — ABNORMAL LOW (ref 20–32)
Calcium: 8.9 mg/dL (ref 8.6–10.2)
Chloride: 105 mmol/L (ref 98–110)
Creat: 0.73 mg/dL (ref 0.50–0.99)
Globulin: 2.8 g/dL (ref 1.9–3.7)
Glucose, Bld: 78 mg/dL (ref 65–99)
Potassium: 3.8 mmol/L (ref 3.5–5.3)
Sodium: 137 mmol/L (ref 135–146)
Total Bilirubin: 0.4 mg/dL (ref 0.2–1.2)
Total Protein: 7.2 g/dL (ref 6.1–8.1)

## 2022-10-25 LAB — LIPID PANEL
Cholesterol: 159 mg/dL (ref <200)
HDL: 66 mg/dL (ref >=50)
LDL Cholesterol (Calc): 80 mg/dL
Non-HDL Cholesterol (Calc): 93 mg/dL (ref <130)
Total CHOL/HDL Ratio: 2.4 (calc) (ref <5.0)
Triglycerides: 51 mg/dL (ref <150)

## 2022-10-25 LAB — HEMOGLOBIN A1C
Hgb A1c MFr Bld: 6.1 %{Hb} — ABNORMAL HIGH (ref <5.7)
Mean Plasma Glucose: 128 mg/dL
eAG (mmol/L): 7.1 mmol/L

## 2022-10-25 LAB — VITAMIN D 25 HYDROXY (VIT D DEFICIENCY, FRACTURES): Vit D, 25-Hydroxy: 37 ng/mL (ref 30–100)

## 2022-12-01 ENCOUNTER — Other Ambulatory Visit: Payer: Self-pay | Admitting: Physician Assistant

## 2022-12-01 DIAGNOSIS — B9689 Other specified bacterial agents as the cause of diseases classified elsewhere: Secondary | ICD-10-CM

## 2022-12-09 ENCOUNTER — Ambulatory Visit: Payer: No Typology Code available for payment source | Admitting: Family Medicine

## 2022-12-14 ENCOUNTER — Ambulatory Visit
Admission: EM | Admit: 2022-12-14 | Discharge: 2022-12-14 | Disposition: A | Discharge status: Home / Self Care | Payer: Commercial Managed Care - PPO | Attending: Internal Medicine | Admitting: Internal Medicine

## 2022-12-14 ENCOUNTER — Ambulatory Visit: Payer: Commercial Managed Care - PPO

## 2022-12-14 DIAGNOSIS — J209 Acute bronchitis, unspecified: Secondary | ICD-10-CM | POA: Diagnosis not present

## 2022-12-14 DIAGNOSIS — R059 Cough, unspecified: Secondary | ICD-10-CM | POA: Diagnosis not present

## 2022-12-14 MED ORDER — PROMETHAZINE-DM 6.25-15 MG/5ML PO SYRP: 5.0000 mL | ORAL_SOLUTION | Freq: Four times a day (QID) | ORAL | 0 refills | Status: AC | PRN

## 2022-12-14 MED ORDER — ALBUTEROL SULFATE HFA 108 (90 BASE) MCG/ACT IN AERS
1.0000 | INHALATION_SPRAY | Freq: Four times a day (QID) | RESPIRATORY_TRACT | 0 refills | Status: DC | PRN
Start: 2022-12-14 — End: 2022-12-22

## 2022-12-14 NOTE — Discharge Instructions (Signed)
Follow-up with your primary care doctor in 1 week if no improvement in symptoms  your x-ray does not show any evidence of pneumonia

## 2022-12-14 NOTE — ED Triage Notes (Signed)
Pt presents to UC w/ c/o productive cough x3 weeks cough. Reports she vomits at times when coughing. Delsym, dayquil do not help.

## 2022-12-14 NOTE — ED Provider Notes (Signed)
[facility]    CSN: [id number] Arrival date & time: 12/14/22  0847      History   Chief Complaint No chief complaint on file.   HPI Aneth A Herbst is a 47 y.o. female.   HPI Cough for 3 weeks with yellow sputum, occasionally feels her breathing is not "right".  Admits several episodes of posttussive emesis.  Has slight scratchy throat and nasal congestion. Denies fever, chills, sweats, chest pain, wheezing, abdominal pain, nausea, vomiting, diarrhea.  Denies recent travel.  Home COVID test at onset of illness was negative.  Denies history of asthma.  Does not smoke.  Past Medical History:  Diagnosis Date   Abdominal tenderness 11/25/2012   Back pain 11/25/2012   Dyspnea 04/25/2011   Gastroenteritis 04/16/2017   HYPERSOMNIA 04/10/2010   HYPERTHYROIDISM 08/03/2006   Low vitamin D level 2019   level = 7    Patient Active Problem List   Diagnosis Date Noted   Prediabetes 10/24/2022   Chronic bilateral low back pain without sciatica 08/01/2022   New daily persistent headache 11/14/2021   Flat feet 01/02/2020   Vitamin D deficiency 07/21/2018   Elevated lipase 12/08/2016   Constipation 11/25/2012   Right carpal tunnel syndrome 12/30/2010   Hyperthyroidism 08/03/2006    Past Surgical History:  Procedure Laterality Date   CESAREAN SECTION     x3   DILATION AND CURETTAGE OF UTERUS     TUBAL LIGATION      OB History     Gravida  5   Para  3   Term  3   Preterm      AB  2   Living  3      SAB  2   IAB      Ectopic      Multiple      Live Births  3            Home Medications    Prior to Admission medications   Medication Sig Start Date End Date Taking? Authorizing Provider  methimazole (TAPAZOLE) 5 MG tablet Take 1 tablet (5 mg total) by mouth daily. 10/10/22  Yes Shamleffer, Konrad Dolores, MD  benzonatate (TESSALON) 100 MG capsule Take 1 capsule (100 mg total) by mouth 3 (three) times daily as needed. Patient not taking:  Reported on 10/10/2022 10/21/21   Margaretann Loveless, PA-C  cholecalciferol (VITAMIN D3) 25 MCG (1000 UNIT) tablet 1 tablet    [provider]  valACYclovir (VALTREX) 500 MG tablet Take 1 tablet (500 mg total) by mouth 2 (two) times daily at onset of symptoms for 3 days. 09/24/22   Nche, Bonna Gains, NP    Family History Family History  Problem Relation Age of Onset   Hypertension Mother    Hyperlipidemia Mother    Sleep apnea Mother    Thyroid disease Mother    Stroke Mother    Cancer Other        Aunt with Cancer- unknown type   Lupus Other        Uncle   Cancer Other        Lung Cancer-Uncle   Breast cancer Neg Hx     Social History Social History   Tobacco Use   Smoking status: Never   Smokeless tobacco: Never  Vaping Use   Vaping status: Never Used  Substance Use Topics   Alcohol use: No    Alcohol/week: 0.0 standard drinks of alcohol   Drug use: No  Allergies   Sulfa antibiotics   Review of Systems Review of Systems  Constitutional:  Negative for fatigue and fever.  Respiratory:  Positive for cough. Negative for chest tightness and shortness of breath.   Cardiovascular:  Negative for chest pain.  Gastrointestinal:  Positive for vomiting (Post tussive). Negative for nausea.  Musculoskeletal:  Negative for back pain.     Physical Exam Triage Vital Signs ED Triage Vitals  Encounter Vitals Group     BP 12/14/22 0924 116/77     Systolic BP Percentile --      Diastolic BP Percentile --      Pulse Rate 12/14/22 0924 78     Resp 12/14/22 0924 16     Temp 12/14/22 0924 98.5 F (36.9 C)     Temp Source 12/14/22 0924 Oral     SpO2 12/14/22 0924 97 %     Weight --      Height --      Head Circumference --      Peak Flow --      Pain Score 12/14/22 0923 0     Pain Loc --      Pain Education --      Exclude from Growth Chart --    No data found.  Updated Vital Signs BP 116/77 (BP Location: Right Arm)   Pulse 78   Temp 98.5 F (36.9  C) (Oral)   Resp 16   SpO2 97%   Visual Acuity Right Eye Distance:   Left Eye Distance:   Bilateral Distance:    Right Eye Near:   Left Eye Near:    Bilateral Near:     Physical Exam Vitals and nursing note reviewed.  HENT:     Head: Normocephalic and atraumatic.     Right Ear: Tympanic membrane and ear canal normal.     Left Ear: Tympanic membrane and ear canal normal.     Mouth/Throat:     Mouth: Mucous membranes are moist.     Pharynx: Oropharynx is clear. No oropharyngeal exudate or posterior oropharyngeal erythema.  Cardiovascular:     Rate and Rhythm: Normal rate and regular rhythm.     Heart sounds: Normal heart sounds.  Pulmonary:     Effort: Pulmonary effort is normal. No respiratory distress.     Breath sounds: Normal breath sounds. No wheezing or rales.  Musculoskeletal:     Cervical back: Neck supple.  Lymphadenopathy:     Cervical: No cervical adenopathy.  Neurological:     Mental Status: She is alert.      UC Treatments / Results  Labs (all labs ordered are listed, but only abnormal results are displayed) Labs Reviewed - No data to display  EKG   Radiology DG Chest 2 View  Result Date: 12/14/2022 CLINICAL DATA:  Cough for 3 weeks EXAM: CHEST - 2 VIEW COMPARISON:  Chest x-ray dated September 17, 2021 FINDINGS: Heart size and mediastinal contours are within normal limits. Mild linear opacities of the left costophrenic angle, likely due to scarring or atelectasis. Lungs are otherwise clear. No evidence of pleural effusion or pneumothorax. Linear opacities overlying the neck base and lung apices on AP view, likely external to the patient. IMPRESSION: No active cardiopulmonary disease. Electronically Signed   By: Allegra Lai M.D.   On: 12/14/2022 09:59    Procedures Procedures (including critical care time)  Medications Ordered in UC Medications - No data to display  Initial Impression / Assessment and Plan / UC Course  I have reviewed the triage  vital signs and the nursing notes.  Pertinent labs & imaging results that were available during my care of the patient were reviewed by me and considered in my medical decision making (see chart for details).     47 year old non-smoker reports cough for 3 weeks with yellow sputum and occasional posttussive emesis.  Her vital signs are stable, exam is normal she is not hypoxic, tachypneic febrile.  Her chest x-ray is normal.  Will treat with inhaler and cough medicine recommend follow-up with PCP  Final Clinical Impressions(s) / UC Diagnoses   Final diagnoses:  None   Discharge Instructions   None    ED Prescriptions   None    PDMP not reviewed this encounter.   Meliton Rattan, [place] 12/14/22 1011

## 2022-12-21 ENCOUNTER — Other Ambulatory Visit: Payer: Self-pay | Admitting: Medical Genetics

## 2022-12-21 DIAGNOSIS — Z006 Encounter for examination for normal comparison and control in clinical research program: Secondary | ICD-10-CM

## 2022-12-22 ENCOUNTER — Ambulatory Visit: Payer: Commercial Managed Care - PPO | Admitting: Nurse Practitioner

## 2022-12-22 VITALS — BP 115/68 | HR 80 | Temp 99.1°F | Resp 19 | Wt 195.2 lb

## 2022-12-22 DIAGNOSIS — R052 Subacute cough: Secondary | ICD-10-CM | POA: Diagnosis not present

## 2022-12-22 NOTE — Patient Instructions (Addendum)
Normal Spirometry Switch zyrtec to Allegra 160mg  BID or 320mg  QD and famotidine 20mg  in AM OVER THE COUNTER. You will be contacted to schedule appointment with ENT  Cough, Adult Coughing is a reflex that clears your throat and airways (respiratory system). It helps heal and protect your lungs. It is normal to cough from time to time. A cough that happens with other symptoms or that lasts a long time may be a sign of a condition that needs treatment. A short-term (acute) cough may only last 2-3 weeks. A long-term (chronic) cough may last 8 or more weeks. Coughing is often caused by: Diseases, such as: An infection of the respiratory system. Asthma or other heart or lung diseases. Gastroesophageal reflux. This is when acid comes back up from the stomach. Breathing in things that irritate your lungs. Allergies. Postnasal drip. This is when mucus runs down the back of your throat. Smoking. Some medicines. Follow these instructions at home: Medicines Take over-the-counter and prescription medicines only as told by your health care provider. Talk with your provider before you take cough medicine (cough suppressants). Eating and drinking Do not drink alcohol. Avoid caffeine. Drink enough fluid to keep your pee (urine) pale yellow. Lifestyle Avoid cigarette smoke. Do not use any products that contain nicotine or tobacco. These products include cigarettes, chewing tobacco, and vaping devices, such as e-cigarettes. If you need help quitting, ask your provider. Avoid things that make you cough. These may include perfumes, candles, cleaning products, or campfire smoke. General instructions  Watch for any changes to your cough. Tell your provider about them. Always cover your mouth when you cough. If the air is dry in your bedroom or home, use a cool mist vaporizer or humidifier. If your cough is worse at night, try to sleep in a semi-upright position. Rest as needed. Contact a health care  provider if: You have new symptoms, or your symptoms get worse. You cough up pus. You have a fever that does not go away or a cough that does not get better after 2-3 weeks. You cannot control your cough with medicine, and you are losing sleep. You have pain that gets worse or is not helped with medicine. You lose weight for no clear reason. You have night sweats. Get help right away if: You cough up blood. You have trouble breathing. Your heart is beating very fast. These symptoms may be an emergency. Get help right away. Call 911. Do not wait to see if the symptoms will go away. Do not drive yourself to the hospital. This information is not intended to replace advice given to you by your health care provider. Make sure you discuss any questions you have with your health care provider. Document Revised: 09/27/2021 Document Reviewed: 09/27/2021 Elsevier Patient Education  2024 [employer].

## 2022-12-22 NOTE — Assessment & Plan Note (Addendum)
Chronic, waxing and waning, occasionally production, worse in last 47month Associated with throat clearing. Occassional SOB with exertion (climbing stairs) Normal CXR and spirometry (FEV1/FVC 0.77, FVC 2.36) Denies any postnasal drainage or GERD or allergic rhinitis. Minimal improvement with promethazine Dm or zyrtec or benzonatate or albuterol.  Switch zyrtec to allegra Start famotidine 20mg  daily Entered ENT referral

## 2022-12-22 NOTE — Progress Notes (Signed)
Established Patient Visit  Patient: Janet [name]   DOB: [birth year]   47 y.o. Female  MRN: [id number] Visit Date: 12/22/2022  Subjective:    Chief Complaint  Patient presents with   OFFICE VISIT     PT is here for cough 4 weeks with phlegm and sometimes dry. PT went to Urgent care for symptoms on 12/14/2022. PT is taking cough syrup and albuterol but voiced it hasn't helped.    HPI  Accompanied by Husband.  Subacute cough Chronic, waxing and waning, occasionally production, worse in last 54month Associated with throat clearing. Occassional SOB with exertion (climbing stairs) Normal CXR and spirometry (FEV1/FVC 0.77, FVC 2.36) Denies any postnasal drainage or GERD or allergic rhinitis. Minimal improvement with promethazine Dm or zyrtec or benzonatate or albuterol.  Switch zyrtec to allegra Start famotidine 20mg  daily Entered ENT referral  Reviewed medical, surgical, and social history today  Medications: Outpatient Medications Prior to Visit  Medication Sig   cholecalciferol (VITAMIN D3) 25 MCG (1000 UNIT) tablet 1 tablet   methimazole (TAPAZOLE) 5 MG tablet Take 1 tablet (5 mg total) by mouth daily.   valACYclovir (VALTREX) 500 MG tablet Take 1 tablet (500 mg total) by mouth 2 (two) times daily at onset of symptoms for 3 days.   [DISCONTINUED] albuterol (VENTOLIN HFA) 108 (90 Base) MCG/ACT inhaler Inhale 1-2 puffs into the lungs every 6 (six) hours as needed for wheezing or shortness of breath.   [DISCONTINUED] benzonatate (TESSALON) 100 MG capsule Take 1 capsule (100 mg total) by mouth 3 (three) times daily as needed. (Patient not taking: Reported on 10/10/2022)   No facility-administered medications prior to visit.   Reviewed past medical and social history.   ROS per HPI above      Objective:  BP 115/68 (BP Location: Left Arm, Patient Position: Sitting, Cuff Size: Normal)   Pulse 80   Temp 99.1 F (37.3 C) (Oral)   Resp 19   Wt 195 lb 3.2 oz  (88.5 kg)   SpO2 97%   BMI 31.51 kg/m      Physical Exam Vitals and nursing note reviewed.  HENT:     Head: Normocephalic.     Mouth/Throat:     Mouth: Mucous membranes are moist.     Pharynx: Oropharynx is clear. Uvula midline.  Neck:     Thyroid: No thyroid mass, thyromegaly or thyroid tenderness.  Cardiovascular:     Rate and Rhythm: Normal rate and regular rhythm.     Pulses: Normal pulses.     Heart sounds: Normal heart sounds.  Pulmonary:     Effort: Pulmonary effort is normal.     Breath sounds: Normal breath sounds.  Musculoskeletal:     Cervical back: Normal range of motion and neck supple.  Lymphadenopathy:     Cervical: No cervical adenopathy.  Neurological:     Mental Status: She is oriented to person, place, and time.     No results found for any visits on 12/22/22.    Assessment & Plan:    Problem List Items Addressed This Visit     Subacute cough - Primary    Chronic, waxing and waning, occasionally production, worse in last 54month Associated with throat clearing. Occassional SOB with exertion (climbing stairs) Normal CXR and spirometry (FEV1/FVC 0.77, FVC 2.36) Denies any postnasal drainage or GERD or allergic rhinitis. Minimal improvement with promethazine Dm or zyrtec or benzonatate or albuterol.  Switch zyrtec to allegra Start famotidine 20mg  daily Entered ENT referral       Relevant Orders   Spirometry with graph   Ambulatory referral to ENT   Return if symptoms worsen or fail to improve.     Alysia Penna, NP

## 2023-01-17 ENCOUNTER — Encounter: Payer: Self-pay | Admitting: Internal Medicine

## 2023-01-19 ENCOUNTER — Other Ambulatory Visit: Payer: Self-pay

## 2023-01-19 ENCOUNTER — Other Ambulatory Visit (HOSPITAL_COMMUNITY): Payer: Self-pay

## 2023-01-19 DIAGNOSIS — E0590 Thyrotoxicosis, unspecified without thyrotoxic crisis or storm: Secondary | ICD-10-CM

## 2023-01-19 MED ORDER — METHIMAZOLE 5 MG PO TABS
5.0000 mg | ORAL_TABLET | Freq: Every day | ORAL | 3 refills | Status: DC
  Filled 2023-01-19: qty 90, 90d supply, fill #0
  Filled 2023-02-05: qty 30, 30d supply, fill #0
  Filled 2023-03-18: qty 30, 30d supply, fill #1

## 2023-01-21 ENCOUNTER — Other Ambulatory Visit: Payer: Self-pay

## 2023-01-21 ENCOUNTER — Ambulatory Visit (INDEPENDENT_AMBULATORY_CARE_PROVIDER_SITE_OTHER): Payer: Commercial Managed Care - PPO | Admitting: Allergy

## 2023-01-21 ENCOUNTER — Encounter: Payer: Self-pay | Admitting: Allergy

## 2023-01-21 ENCOUNTER — Other Ambulatory Visit (HOSPITAL_COMMUNITY): Payer: Self-pay

## 2023-01-21 VITALS — BP 114/82 | HR 82 | Temp 98.4°F | Resp 14 | Ht 65.75 in | Wt 194.3 lb

## 2023-01-21 DIAGNOSIS — J310 Chronic rhinitis: Secondary | ICD-10-CM | POA: Diagnosis not present

## 2023-01-21 DIAGNOSIS — R053 Chronic cough: Secondary | ICD-10-CM

## 2023-01-21 MED ORDER — LEVOCETIRIZINE DIHYDROCHLORIDE 5 MG PO TABS
5.0000 mg | ORAL_TABLET | Freq: Every evening | ORAL | 5 refills | Status: DC
  Filled 2023-01-21 – 2023-03-18 (×3): qty 30, 30d supply, fill #0

## 2023-01-21 MED ORDER — ALBUTEROL SULFATE HFA 108 (90 BASE) MCG/ACT IN AERS
2.0000 | INHALATION_SPRAY | Freq: Four times a day (QID) | RESPIRATORY_TRACT | 1 refills | Status: DC | PRN
  Filled 2023-01-21 – 2023-02-05 (×2): qty 6.7, 25d supply, fill #0

## 2023-01-21 MED ORDER — RYALTRIS 665-25 MCG/ACT NA SUSP: NASAL | 5 refills | Status: DC

## 2023-01-21 NOTE — Progress Notes (Signed)
New Patient Note  RE: Janet [name] MRN: [id number] DOB: 1975-10-17 Date of Office Visit: 01/21/2023   Primary care provider: Anne Ng, NP  Chief Complaint: Allergies and cough  History of present illness: Janet [name] is a 47 y.o. female presenting today for evaluation of allergies, cough.  Discussed the use of AI scribe software for clinical note transcription with the patient, who gave verbal consent to proceed.  The patient, with a history of chronic cough and suspected environmental allergies, presents with persistent coughing. The coughing spells were initially triggered by sitting outside during lunch breaks, particularly during pollen season, and would intensify at night. The patient attempted self-management with over-the-counter allergy medications, but the usage was inconsistent.  In recent years, the coughing spells have become more frequent and severe, prompting the patient to seek medical attention. The patient was previously prescribed omeprazole but did not initiate this therapy to see if possible reflux was a contributor. A recent cold in October, lasting approximately a month to a month and a half, exacerbated the coughing and introduced a new symptom of phlegm production. The cough now alternates between dry and productive.  The patient also reports that certain smells can trigger severe coughing spells like strong perfumes. This reaction has been noted in various environments, including a patient workplace and a restaurant. The patient also reports occasional throat clearing, itchy eyes during pollen season, and a runny nose when dusting at home.  The patient has been self-medicating with various antihistamines, including Zyrtec, Claritin, and most recently, Allegra. The Allegra seemed to provide some relief initially, but the coughing spells have persisted. The patient has not been officially diagnosed with allergies but has been taking allergy  medication preemptively.  The patient has no history of asthma, eczema, or food allergies, but does report an allergy to sulfa antibiotics. The patient has also used a saline nasal spray in the past for dryness in the nose. A recent lung function test performed by her PCP showed normal results.      Review of systems: 10pt ROS negative unless noted above in the HPI  All other systems negative unless noted above in HPI  Past medical history: Past Medical History:  Diagnosis Date   Abdominal tenderness 11/25/2012   Back pain 11/25/2012   Dyspnea 04/25/2011   Gastroenteritis 04/16/2017   HYPERSOMNIA 04/10/2010   HYPERTHYROIDISM 08/03/2006   Low vitamin D level 2019   level = 7    Past surgical history: Past Surgical History:  Procedure Laterality Date   CESAREAN SECTION     x3   DILATION AND CURETTAGE OF UTERUS     TUBAL LIGATION      Family history:  Family History  Problem Relation Age of Onset   Hypertension Mother    Hyperlipidemia Mother    Sleep apnea Mother    Thyroid disease Mother    Stroke Mother    Cancer Other        Aunt with Cancer- unknown type   Lupus Other        Uncle   Cancer Other        Lung Cancer-Uncle   Breast cancer Neg Hx     Social history: Lives in a home with carpeting with central cooling.  Dog in the home.  There is no concern for water damage, mildew or roaches in the home.  She is a Scientist, forensic.  She denies a smoking history.   Medication List: Current Outpatient  Medications  Medication Sig Dispense Refill   albuterol (VENTOLIN HFA) 108 (90 Base) MCG/ACT inhaler Inhale 2 puffs into the lungs every 6 (six) hours as needed for wheezing or shortness of breath. 6.7 g 1   cholecalciferol (VITAMIN D3) 25 MCG (1000 UNIT) tablet 1 tablet     levocetirizine (XYZAL) 5 MG tablet Take 1 tablet (5 mg total) by mouth every evening. 30 tablet 5   methimazole (TAPAZOLE) 5 MG tablet Take 1 tablet (5 mg total) by mouth daily. 90 tablet  3   Olopatadine-Mometasone (RYALTRIS) 665-25 MCG/ACT SUSP 2 sprays each nostril twice a day as needed for congestion or drainage (throat clearing, cough) 29 g 5   valACYclovir (VALTREX) 500 MG tablet Take 1 tablet (500 mg total) by mouth 2 (two) times daily at onset of symptoms for 3 days. 30 tablet 3   No current facility-administered medications for this visit.    Known medication allergies: Allergies  Allergen Reactions   Sulfa Antibiotics Shortness Of Breath and Swelling     Physical examination: Blood pressure 114/82, pulse 82, temperature 98.4 F (36.9 C), temperature source Temporal, resp. rate 14, height 5' 5.75" (1.67 m), weight 194 lb 4.8 oz (88.1 kg), SpO2 99%.  General: Alert, interactive, in no acute distress. HEENT: PERRLA, TMs pearly gray, turbinates mildly edematous without discharge, post-pharynx non erythematous. Neck: Supple without lymphadenopathy. Lungs: Clear to auscultation without wheezing, rhonchi or rales. {no increased work of breathing. CV: Normal S1, S2 without murmurs. Abdomen: Nondistended, nontender. Skin: Warm and dry, without lesions or rashes. Extremities:  No clubbing, cyanosis or edema. Neuro:   Grossly intact.  Diagnositics/Labs: Spirometry done by PCP on 12/22/2022 FVC of 3.4 L or 110%, FEV1 2.64 L or 104% which is a normal obstructive study  Assessment and plan:   Chronic Cough Exacerbated by environmental factors such as pollen and certain smells. Recent upper respiratory infection led to increased cough frequency and phlegm production.  Lung function test results from a month ago were normal. -Plan to conduct environmental allergy testing to identify potential allergens. -Recommend use of Albuterol inhaler as needed for cough triggered by odors.   Have access to albuterol inhaler 2 puffs every 4-6 hours as needed for cough/wheeze/shortness of breath/chest tightness.  May use 15-20 minutes prior to activity.   Monitor frequency of use.    -Recommend use of Ryaltris nasal spray to decrease mucus production and congestion.  Use 2 sprays each nostril twice a day as needed for congestion or drainage (throat clearing, cough) With using nasal sprays point tip of bottle toward eye on same side nostril and lean head slightly forward for best technique.    Rhinitis Reports of itchy eyes during pollen season and runny nose when dusting. Has tried various antihistamines (Zyrtec, Claritin, Allegra) with variable effectiveness. -Plan to conduct environmental allergy testing to identify potential allergens. -Recommend rotation of antihistamines, next to try is Xyzal. -Continue use of saline nasal spray as needed for dryness. -Recommend use of Ryaltris nasal spray to decrease mucus production and congestion.   Schedule skin testing visit for environmental allergy testing.  Hold antihistamines for 3 days prior to this visit.  Routine follow-up in 3-4 months or sooner if needed  I appreciate the opportunity to take part in Pailyn's care. Please do not hesitate to contact me with questions.  Sincerely,   Margo Aye, MD Allergy/Immunology [facility]

## 2023-01-21 NOTE — Patient Instructions (Addendum)
Chronic Cough Exacerbated by environmental factors such as pollen and certain smells. Recent upper respiratory infection led to increased cough frequency and phlegm production.  Lung function test results from a month ago were normal. -Plan to conduct environmental allergy testing to identify potential allergens. -Recommend use of Albuterol inhaler as needed for cough triggered by odors.   Have access to albuterol inhaler 2 puffs every 4-6 hours as needed for cough/wheeze/shortness of breath/chest tightness.  May use 15-20 minutes prior to activity.   Monitor frequency of use.   -Recommend use of Ryaltris nasal spray to decrease mucus production and congestion.  Use 2 sprays each nostril twice a day as needed for congestion or drainage (throat clearing, cough) With using nasal sprays point tip of bottle toward eye on same side nostril and lean head slightly forward for best technique.     Environmental Allergies Reports of itchy eyes during pollen season and runny nose when dusting. Has tried various antihistamines (Zyrtec, Claritin, Allegra) with variable effectiveness. -Plan to conduct environmental allergy testing to identify potential allergens. -Recommend rotation of antihistamines, next to try is Xyzal. -Continue use of saline nasal spray as needed for dryness. -Recommend use of Ryaltris nasal spray to decrease mucus production and congestion.   Schedule skin testing visit for environmental allergy testing.  Hold antihistamines for 3 days prior to this visit.  Routine follow-up in 3-4 months or sooner if needed

## 2023-01-28 ENCOUNTER — Ambulatory Visit: Payer: Commercial Managed Care - PPO | Admitting: Allergy

## 2023-01-29 ENCOUNTER — Institutional Professional Consult (permissible substitution) (INDEPENDENT_AMBULATORY_CARE_PROVIDER_SITE_OTHER): Payer: Commercial Managed Care - PPO | Admitting: Otolaryngology

## 2023-02-02 ENCOUNTER — Other Ambulatory Visit (HOSPITAL_COMMUNITY): Payer: Self-pay

## 2023-02-03 ENCOUNTER — Ambulatory Visit: Payer: Commercial Managed Care - PPO | Admitting: Family

## 2023-02-05 ENCOUNTER — Other Ambulatory Visit: Payer: Self-pay

## 2023-02-05 ENCOUNTER — Other Ambulatory Visit (HOSPITAL_COMMUNITY): Payer: Self-pay

## 2023-02-09 ENCOUNTER — Encounter (INDEPENDENT_AMBULATORY_CARE_PROVIDER_SITE_OTHER): Payer: Self-pay | Admitting: Otolaryngology

## 2023-02-09 ENCOUNTER — Ambulatory Visit (INDEPENDENT_AMBULATORY_CARE_PROVIDER_SITE_OTHER): Payer: Commercial Managed Care - PPO | Admitting: Otolaryngology

## 2023-02-09 VITALS — BP 123/71 | HR 104 | Ht 65.5 in | Wt 193.0 lb

## 2023-02-09 DIAGNOSIS — R0982 Postnasal drip: Secondary | ICD-10-CM | POA: Diagnosis not present

## 2023-02-09 DIAGNOSIS — R053 Chronic cough: Secondary | ICD-10-CM | POA: Diagnosis not present

## 2023-02-09 DIAGNOSIS — J3089 Other allergic rhinitis: Secondary | ICD-10-CM | POA: Diagnosis not present

## 2023-02-09 DIAGNOSIS — K219 Gastro-esophageal reflux disease without esophagitis: Secondary | ICD-10-CM

## 2023-02-09 NOTE — Patient Instructions (Signed)

## 2023-02-09 NOTE — Progress Notes (Signed)
ENT CONSULT:  Reason for Consult: chronic cough for years since 2017   HPI: Discussed the use of AI scribe software for clinical note transcription with the patient, who gave consent to proceed.  History of Present Illness   The patient is a 47 yoF, with a history of enlarged thyroid and hyperthyroidism on methimazole, presents for evaluation of a chronic cough that has persisted for several years. The patient also reports a recent cold that lasted for about a month or two, during which the cough worsened and was associated with increased mucus production. The cough is now triggered by various factors, including swallowing incorrectly or being in a restaurant, and is often accompanied by a significant coughing spell.  The patient denies any history of asthma or smoking. She also reports no heartburn symptoms but does experience discomfort in the upper abdomen from time to time. She is currently under the care of a gastroenterologist for this issue and has previously undergone an upper endoscopy and colonoscopy in June of 2023.   The patient has been on Allegra for postnasal drainage. She has not yet started on Xyzal, which was recently prescribed. She also reports that she has not taken the prescribed famotidine, given to her by PCP last time she was seen.  The patient's cough is dry and does not seem to follow a specific pattern throughout the day. It was initially thought to be related to allergies, as it would often occur after being outside during the spring or summer months. However, the cough now occurs at any time, even during conversation.     Records Reviewed:  ED note from 12/14/22 Cough for 3 weeks with yellow sputum, occasionally feels her breathing is not "right".  Admits several episodes of posttussive emesis.  Has slight scratchy throat and nasal congestion. Denies fever, chills, sweats, chest pain, wheezing, abdominal pain, nausea, vomiting, diarrhea.  Denies recent travel.  Home  COVID test at onset of illness was negative.  Denies history of asthma.  Does not smoke.   Pertinent labs & imaging results that were available during my care of the patient were reviewed by me and considered in my medical decision making (see chart for details).     47 year old non-smoker reports cough for 3 weeks with yellow sputum and occasional posttussive emesis.  Her vital signs are stable, exam is normal she is not hypoxic, tachypneic febrile.  Her chest x-ray is normal.  Will treat with inhaler and cough medicine recommend follow-up with PCP  Subacute cough Chronic, waxing and waning, occasionally production, worse in last 883month Associated with throat clearing. Occassional SOB with exertion (climbing stairs) Normal CXR and spirometry (FEV1/FVC 0.77, FVC 2.36) Denies any postnasal drainage or GERD or allergic rhinitis. Minimal improvement with promethazine Dm or zyrtec or benzonatate or albuterol.   Switch zyrtec to allegra Start famotidine 20mg  daily Entered ENT referral   Subacute cough - Primary       Chronic, waxing and waning, occasionally production, worse in last 883month Associated with throat clearing. Occassional SOB with exertion (climbing stairs) Normal CXR and spirometry (FEV1/FVC 0.77, FVC 2.36) Denies any postnasal drainage or GERD or allergic rhinitis. Minimal improvement with promethazine Dm or zyrtec or benzonatate or albuterol.   Switch zyrtec to allegra Start famotidine 20mg  daily Entered ENT referral     Past Medical History:  Diagnosis Date   Abdominal tenderness 11/25/2012   Back pain 11/25/2012   Dyspnea 04/25/2011   Gastroenteritis 04/16/2017   HYPERSOMNIA 04/10/2010   HYPERTHYROIDISM  08/03/2006   Low vitamin D level 2019   level = 7    Past Surgical History:  Procedure Laterality Date   CESAREAN SECTION     x3   DILATION AND CURETTAGE OF UTERUS     TUBAL LIGATION      Family History  Problem Relation Age of Onset   Hypertension Mother     Hyperlipidemia Mother    Sleep apnea Mother    Thyroid disease Mother    Stroke Mother    Cancer Other        Aunt with Cancer- unknown type   Lupus Other        Uncle   Cancer Other        Lung Cancer-Uncle   Breast cancer Neg Hx     Social History:  reports that she has never smoked. She has been exposed to tobacco smoke. She has never used smokeless tobacco. She reports that she does not drink alcohol and does not use drugs.  Allergies:  Allergies  Allergen Reactions   Sulfa Antibiotics Shortness Of Breath and Swelling    Medications: I have reviewed the patient's current medications.  The PMH, PSH, Medications, Allergies, and SH were reviewed and updated.  ROS: Constitutional: Negative for fever, weight loss and weight gain. Cardiovascular: Negative for chest pain and dyspnea on exertion. Respiratory: Is not experiencing shortness of breath at rest. Gastrointestinal: Negative for nausea and vomiting. Neurological: Negative for headaches. Psychiatric: The patient is not nervous/anxious  Blood pressure 123/71, pulse (!) 104, height 5' 5.5" (1.664 m), weight 193 lb (87.5 kg), SpO2 97%.  PHYSICAL EXAM:  Exam: General: Well-developed, well-nourished Communication and Voice: Clear pitch and clarity Respiratory Respiratory effort: Equal inspiration and expiration without stridor Cardiovascular Peripheral Vascular: Warm extremities with equal color/perfusion Eyes: No nystagmus with equal extraocular motion bilaterally Neuro/Psych/Balance: Patient oriented to person, place, and time; Appropriate mood and affect; Gait is intact with no imbalance; Cranial nerves I-XII are intact Head and Face Inspection: Normocephalic and atraumatic without mass or lesion Palpation: Facial skeleton intact without bony stepoffs Salivary Glands: No mass or tenderness Facial Strength: Facial motility symmetric and full bilaterally ENT Pinna: External ear intact and fully  developed External canal: Canal is patent with intact skin Tympanic Membrane: Clear and mobile External Nose: No scar or anatomic deformity Internal Nose: Septum is deviated to the left. No polyp, or purulence. Mucosal edema and erythema present.  Bilateral inferior turbinate hypertrophy.  Lips, Teeth, and gums: Mucosa and teeth intact and viable TMJ: No pain to palpation with full mobility Oral cavity/oropharynx: No erythema or exudate, no lesions present Nasopharynx: No mass or lesion with intact mucosa Hypopharynx: Intact mucosa without pooling of secretions Larynx Glottic: Full true vocal cord mobility without lesion or mass Supraglottic: Normal appearing epiglottis and AE folds Interarytenoid Space: Moderate pachydermia&edema Subglottic Space: Patent without lesion or edema Neck Neck and Trachea: Midline trachea without mass or lesion Thyroid: No mass or nodularity Lymphatics: No lymphadenopathy  Procedure: Preoperative diagnosis: chronic cough   Postoperative diagnosis:   Same + GERD LPR  Procedure: Flexible fiberoptic laryngoscopy  Surgeon: Ashok Croon, MD  Anesthesia: Topical lidocaine and Afrin Complications: None Condition is stable throughout exam  Indications and consent:  The patient presents to the clinic with Indirect laryngoscopy view was incomplete. Thus it was recommended that they undergo a flexible fiberoptic laryngoscopy. All of the risks, benefits, and potential complications were reviewed with the patient preoperatively and verbal informed consent was obtained.  Procedure:  The patient was seated upright in the clinic. Topical lidocaine and Afrin were applied to the nasal cavity. After adequate anesthesia had occurred, I then proceeded to pass the flexible telescope into the nasal cavity. The nasal cavity was patent without rhinorrhea or polyp. The nasopharynx was also patent without mass or lesion. The base of tongue was visualized and was normal. There  were no signs of pooling of secretions in the piriform sinuses. The true vocal folds were mobile bilaterally. There were no signs of glottic or supraglottic mucosal lesion or mass. There was moderate interarytenoid pachydermia and post cricoid edema. The telescope was then slowly withdrawn and the patient tolerated the procedure throughout.      Studies Reviewed: CXR 2 View FINDINGS: Heart size and mediastinal contours are within normal limits. Mild linear opacities of the left costophrenic angle, likely due to scarring or atelectasis. Lungs are otherwise clear. No evidence of pleural effusion or pneumothorax. Linear opacities overlying the neck base and lung apices on AP view, likely external to the patient.   IMPRESSION: No active cardiopulmonary disease.  Spirometry 12/21/22  Assessment/Plan: Encounter Diagnoses  Name Primary?   Chronic cough Yes   Gastroesophageal reflux disease without esophagitis    Post-nasal drip    Environmental and seasonal allergies     Assessment and Plan    Chronic Cough Chronic cough persisting for several years, mostly dry with gradual worsening over time. Recent exacerbation following a cold with increased mucus production. No improvement with omeprazole in the past. Not on reflux medications. F/b GI and has upcoming EGD scheduled for epigastric pain/discomfort. Normal chest x-ray and spirometry in November 2024. No history of asthma or smoking. No postnasal drainage or nasal congestion. Possible causes include silent reflux, and neurogenic cough.  Flexible laryngoscopy today was somewhat limited due to patient's poor tolerance of the procedure, but b/l VF's were mobile, and there were no lesions or masses noted, she did have post-cricoid edema/pachydermia suggestive of GERD LPR.  Discussed alternative treatments including dietary and lifestyle changes, and potential future testing like pH probe monitoring and allergy testing if symptoms persist.    Explained that neurogenic cough could be considered if no response to initial treatments.  - Start famotidine 20 mg twice daily - Implement dietary and lifestyle changes to reduce reflux - Provided written after-visit summary with instructions and website for reflux management - Consider neurogenic cough if no response to treatment - Discuss potential allergy testing and pH probe monitoring if symptoms persist  GERD LPR Evidence of inflammation from reflux observed during limited scope exam today despite absence of heartburn symptoms. - Start famotidine 20 mg twice daily - Recommend dietary and lifestyle changes to limit GERD LPR - Provided information about Reflux Gourmet and advised taking after meals  Environmental allergies  Possible postnasal drainage contributing to chronic cough. Currently using Allegra. Xyzal prescribed but not yet started. Ryaltris nasal spray not covered by insurance, not on any nasal sprays right now. Discussed alternative nasal sprays and the option of using two separate nasal sprays instead of a combination spray. - Continue Allegra 180 mg daily or switch to Xyzal if no improvement  - Discuss with primary care about alternative nasal sprays covered by insurance - Consider using two separate nasal sprays instead of combination spray  Enlarged Thyroid and hyperthyroidism  Long-standing issue managed with methimazole for over twenty years. - Endocrine f/u   Follow-up - Schedule follow-up appointment in 6 months     Thank you for allowing  me to participate in the care of this patient. Please do not hesitate to contact me with any questions or concerns.   Ashok Croon, MD Otolaryngology [facility] Phone: [phone] Fax: [phone]    02/10/2023, 12:10 PM

## 2023-03-18 ENCOUNTER — Other Ambulatory Visit (HOSPITAL_COMMUNITY): Payer: Self-pay

## 2023-04-13 NOTE — Progress Notes (Unsigned)
 Name: Janet [name]  MRN/ DOB: [id number], 05/06/1975    Age/ Sex: 48 y.o., female     PCP: Anne Ng, NP   Reason for Endocrinology Evaluation: Hyperthyroidism     Initial Endocrinology Clinic Visit: 02/27/2012    PATIENT IDENTIFIER: Janet [name] is a 48 y.o., female with a past medical history of hyperthyroidism. She has followed with [facility] since 02/27/2012 for consultative assistance with management of her hyperthyroidism.   HISTORICAL SUMMARY: The patient was first diagnosed with hyperthyroidism in 2007 during pregnancy.  Thyroid uptake and scan in May 2007 demonstrated diffuse enlargement of the thyroid gland with a multinodular appearance. A discrete toxic nodule is not demonstrated.   Thyroid ultrasound in April 2018 showed normal thyroid with no nodules. Repeat thyroid ultrasound 2024 did not reveal any nodules   Patient has been on methimazole since her diagnosis  Mother with thyroid disease, S/P RAI     SUBJECTIVE:    Today (04/14/2023):  Ms. Whinery is here for hyperthyroidism.    Weight has been trending up  No local neck swelling  Denies palpitations Has occasional tremors  She chronic constipation  No eye symptoms    Methimazole 5 mg daily    HISTORY:  Past Medical History:  Past Medical History:  Diagnosis Date   Abdominal tenderness 11/25/2012   Back pain 11/25/2012   Dyspnea 04/25/2011   Gastroenteritis 04/16/2017   HYPERSOMNIA 04/10/2010   HYPERTHYROIDISM 08/03/2006   Low vitamin D level 2019   level = 7   Past Surgical History:  Past Surgical History:  Procedure Laterality Date   CESAREAN SECTION     x3   DILATION AND CURETTAGE OF UTERUS     TUBAL LIGATION     Social History:  reports that she has never smoked. She has been exposed to tobacco smoke. She has never used smokeless tobacco. She reports that she does not drink alcohol and does not use drugs. Family History:  Family History  Problem  Relation Age of Onset   Hypertension Mother    Hyperlipidemia Mother    Sleep apnea Mother    Thyroid disease Mother    Stroke Mother    Cancer Other        Aunt with Cancer- unknown type   Lupus Other        Uncle   Cancer Other        Lung Cancer-Uncle   Breast cancer Neg Hx      HOME MEDICATIONS: Allergies as of 04/14/2023       Reactions   Sulfa Antibiotics Shortness Of Breath, Swelling        Medication List        Accurate as of April 14, 2023  7:33 AM. If you have any questions, ask your nurse or doctor.          albuterol 108 (90 Base) MCG/ACT inhaler Commonly known as: Ventolin HFA Inhale 2 puffs into the lungs every 6 (six) hours as needed for wheezing or shortness of breath.   cholecalciferol 25 MCG (1000 UNIT) tablet Commonly known as: VITAMIN D3 1 tablet   levocetirizine 5 MG tablet Commonly known as: XYZAL Take 1 tablet (5 mg total) by mouth every evening.   methimazole 5 MG tablet Commonly known as: TAPAZOLE Take 1 tablet (5 mg total) by mouth daily.   Ryaltris 956-21 MCG/ACT Susp Generic drug: Olopatadine-Mometasone 2 sprays each nostril twice a day as needed for congestion or drainage (  throat clearing, cough)   valACYclovir 500 MG tablet Commonly known as: VALTREX Take 1 tablet (500 mg total) by mouth 2 (two) times daily at onset of symptoms for 3 days.          OBJECTIVE:   PHYSICAL EXAM: VS: BP 120/70 (BP Location: Right Arm, Patient Position: Sitting, Cuff Size: Small)   Pulse 85   Ht 5' 5.5" (1.664 m)   Wt 200 lb 12.8 oz (91.1 kg)   SpO2 99%   BMI 32.91 kg/m    EXAM: General: Pt appears well and is in NAD  Eyes: External eye exam normal without stare, lid lag or exophthalmos.  EOM intact.  PERRL.  Neck: General: Supple without adenopathy. Thyroid: Thyroid size normal.  Right thyroid asymmetry noted   Lungs: Clear with good BS bilat with no rales, rhonchi, or wheezes  Heart: Auscultation: RRR.  Abdomen: Normoactive  bowel sounds, soft, nontender, without masses or organomegaly palpable  Extremities:  BL LE: No pretibial edema normal ROM and strength.  Mental Status: Judgment, insight: Intact Orientation: Oriented to time, place, and person Mood and affect: No depression, anxiety, or agitation     DATA REVIEWED:  Latest Reference Range & Units 10/10/22 14:29  TSH 0.35 - 5.50 uIU/mL 2.29  T4,Free(Direct) 0.60 - 1.60 ng/dL 1.61     Latest Reference Range & Units 03/14/21 13:43  Sodium 135 - 145 mEq/L 138  Potassium 3.5 - 5.1 mEq/L 4.1  Chloride 96 - 112 mEq/L 103  CO2 19 - 32 mEq/L 29  Glucose 70 - 99 mg/dL 76  BUN 6 - 23 mg/dL 13  Creatinine 0.96 - 0.45 mg/dL 4.09  Calcium 8.4 - 81.1 mg/dL 9.2  Alkaline Phosphatase 39 - 117 U/L 64  Albumin 3.5 - 5.2 g/dL 4.2  Lipase 91.4 - 78.2 U/L 75.0 (H)  AST 0 - 37 U/L 20  ALT 0 - 35 U/L 18  Total Protein 6.0 - 8.3 g/dL 7.6  Total Bilirubin 0.2 - 1.2 mg/dL 0.4  GFR >95.62 mL/min 98.71  Total CHOL/HDL Ratio  3  Cholesterol 0 - 200 mg/dL 130  HDL Cholesterol >86.57 mg/dL 84.69  LDL (calc) 0 - 99 mg/dL 85  NonHDL  62.95  Triglycerides 0.0 - 149.0 mg/dL 28.4  VLDL 0.0 - 13.2 mg/dL 44.0    Latest Reference Range & Units 03/14/21 13:43  WBC 4.0 - 10.5 K/uL 5.3  RBC 3.87 - 5.11 Mil/uL 4.70  Hemoglobin 12.0 - 15.0 g/dL 10.2  HCT 72.5 - 36.6 % 39.7  MCV 78.0 - 100.0 fl 84.6  MCHC 30.0 - 36.0 g/dL 44.0  RDW 34.7 - 42.5 % 14.0  Platelets 150.0 - 400.0 K/uL 386.0  Neutrophils 43.0 - 77.0 % 48.5  Lymphocytes 12.0 - 46.0 % 38.3  Monocytes Relative 3.0 - 12.0 % 6.7  Eosinophil 0.0 - 5.0 % 6.0 (H)  Basophil 0.0 - 3.0 % 0.5  NEUT# 1.4 - 7.7 K/uL 2.6  Lymphocyte # 0.7 - 4.0 K/uL 2.0  Monocyte # 0.1 - 1.0 K/uL 0.4  Eosinophils Absolute 0.0 - 0.7 K/uL 0.3  Basophils Absolute 0.0 - 0.1 K/uL 0.0    Thyroid Ultrasound 10/17/2022   Estimated total number of nodules >/= 1 cm: 0   Number of spongiform nodules >/=  2 cm not described below (TR1): 0    Number of mixed cystic and solid nodules >/= 1.5 cm not described below (TR2): 0   _________________________________________________________   No discrete nodules are seen within the thyroid gland.  Positive increased flow on color Doppler imaging consistent with hypervascularity.   IMPRESSION: Mildly enlarged, heterogeneous and hypervascular thyroid gland. In the setting of hyperthyroidism, differential considerations include Graves disease and active thyroiditis.   No discrete thyroid nodules.   ASSESSMENT / PLAN / RECOMMENDATIONS:   Hyperthyroidism:  -Patient is clinically euthyroid -Thyroid uptake and scan in 2007 showed multinodular goiter, thyroid ultrasound in 2018 and 2024 did not show any evidence of nodules  - Suspect Graves' disease  -Repeat TFTs ***   Medications   Continue methimazole 5 mg, 1 tablet daily     Follow-up in 6 months   Signed electronically by: Lyndle Herrlich, MD  [facility] Endocrinology  [facility] [address] [place] [place], [place] [address] Phone: [phone] FAX: [phone]      CC: Anne Ng, NP [address] [place] [address] Phone: [phone]  Fax: [phone]   Return to Endocrinology clinic as below: No future appointments.

## 2023-04-14 ENCOUNTER — Encounter: Payer: Self-pay | Admitting: Internal Medicine

## 2023-04-14 ENCOUNTER — Ambulatory Visit: Payer: No Typology Code available for payment source | Admitting: Internal Medicine

## 2023-04-14 VITALS — BP 120/70 | HR 85 | Ht 65.5 in | Wt 200.8 lb

## 2023-04-14 DIAGNOSIS — E0590 Thyrotoxicosis, unspecified without thyrotoxic crisis or storm: Secondary | ICD-10-CM | POA: Diagnosis not present

## 2023-04-15 ENCOUNTER — Other Ambulatory Visit (HOSPITAL_COMMUNITY): Payer: Self-pay

## 2023-04-15 ENCOUNTER — Encounter: Payer: Self-pay | Admitting: Internal Medicine

## 2023-04-15 LAB — TSH: TSH: 2.88 m[IU]/L

## 2023-04-15 LAB — T4, FREE: Free T4: 1 ng/dL (ref 0.8–1.8)

## 2023-04-15 MED ORDER — METHIMAZOLE 5 MG PO TABS
5.0000 mg | ORAL_TABLET | ORAL | 3 refills | Status: DC
  Filled 2023-04-15: qty 78, 90d supply, fill #0
  Filled 2023-07-21: qty 78, 90d supply, fill #1
  Filled 2023-10-14: qty 78, 90d supply, fill #2

## 2023-04-26 ENCOUNTER — Emergency Department (HOSPITAL_COMMUNITY)
Admission: EM | Admit: 2023-04-26 | Discharge: 2023-04-26 | Disposition: A | Discharge status: Home / Self Care | Attending: Emergency Medicine | Admitting: Emergency Medicine

## 2023-04-26 ENCOUNTER — Other Ambulatory Visit: Payer: Self-pay

## 2023-04-26 ENCOUNTER — Emergency Department (HOSPITAL_COMMUNITY)

## 2023-04-26 ENCOUNTER — Encounter (HOSPITAL_COMMUNITY): Payer: Self-pay | Admitting: *Deleted

## 2023-04-26 DIAGNOSIS — E039 Hypothyroidism, unspecified: Secondary | ICD-10-CM | POA: Diagnosis not present

## 2023-04-26 DIAGNOSIS — R1031 Right lower quadrant pain: Secondary | ICD-10-CM | POA: Diagnosis not present

## 2023-04-26 DIAGNOSIS — K820 Obstruction of gallbladder: Secondary | ICD-10-CM | POA: Diagnosis not present

## 2023-04-26 LAB — URINALYSIS, ROUTINE W REFLEX MICROSCOPIC
Bacteria, UA: NONE SEEN
Bilirubin Urine: NEGATIVE
Glucose, UA: NEGATIVE mg/dL
Ketones, ur: NEGATIVE mg/dL
Nitrite: NEGATIVE
Protein, ur: 100 mg/dL — AB
RBC / HPF: >50 RBC/hpf (ref 0–5)
Specific Gravity, Urine: 1.018 (ref 1.005–1.030)
pH: 6 (ref 5.0–8.0)

## 2023-04-26 LAB — COMPREHENSIVE METABOLIC PANEL
ALT: 15 U/L (ref 0–44)
AST: 20 U/L (ref 15–41)
Albumin: 3.3 g/dL — ABNORMAL LOW (ref 3.5–5.0)
Alkaline Phosphatase: 50 U/L (ref 38–126)
Anion gap: 11 (ref 5–15)
BUN: 17 mg/dL (ref 6–20)
CO2: 18 mmol/L — ABNORMAL LOW (ref 22–32)
Calcium: 8.9 mg/dL (ref 8.9–10.3)
Chloride: 107 mmol/L (ref 98–111)
Creatinine, Ser: 0.98 mg/dL (ref 0.44–1.00)
GFR, Estimated: >60 mL/min (ref >60)
Glucose, Bld: 168 mg/dL — ABNORMAL HIGH (ref 70–99)
Potassium: 3.8 mmol/L (ref 3.5–5.1)
Sodium: 136 mmol/L (ref 135–145)
Total Bilirubin: 0.4 mg/dL (ref 0.0–1.2)
Total Protein: 6.4 g/dL — ABNORMAL LOW (ref 6.5–8.1)

## 2023-04-26 LAB — CBC
HCT: 35.9 % — ABNORMAL LOW (ref 36.0–46.0)
Hemoglobin: 11.5 g/dL — ABNORMAL LOW (ref 12.0–15.0)
MCH: 27.6 pg (ref 26.0–34.0)
MCHC: 32 g/dL (ref 30.0–36.0)
MCV: 86.1 fL (ref 80.0–100.0)
Platelets: 378 10*3/uL (ref 150–400)
RBC: 4.17 MIL/uL (ref 3.87–5.11)
RDW: 14.7 % (ref 11.5–15.5)
WBC: 6.7 10*3/uL (ref 4.0–10.5)
nRBC: 0 % (ref 0.0–0.2)

## 2023-04-26 LAB — LIPASE, BLOOD: Lipase: 71 U/L — ABNORMAL HIGH (ref 11–51)

## 2023-04-26 LAB — HCG, SERUM, QUALITATIVE: Preg, Serum: NEGATIVE

## 2023-04-26 MED ORDER — HYDROMORPHONE HCL 1 MG/ML IJ SOLN
0.5000 mg | Freq: Once | INTRAMUSCULAR | Status: AC
  Administered 2023-04-26: 0.5 mg via INTRAVENOUS
  Filled 2023-04-26: qty 1

## 2023-04-26 MED ORDER — IOHEXOL 350 MG/ML SOLN
75.0000 mL | Freq: Once | INTRAVENOUS | Status: AC | PRN
  Administered 2023-04-26: 75 mL via INTRAVENOUS

## 2023-04-26 MED ORDER — KETOROLAC TROMETHAMINE 15 MG/ML IJ SOLN
15.0000 mg | Freq: Once | INTRAMUSCULAR | Status: AC
  Administered 2023-04-26: 15 mg via INTRAVENOUS
  Filled 2023-04-26: qty 1

## 2023-04-26 NOTE — ED Notes (Signed)
 Pt unable to void at this time.  KM

## 2023-04-26 NOTE — Discharge Instructions (Addendum)
 You were evaluated in the ED today with concern for right lower quadrant abdominal pain.  Based on your evaluation, we have a low suspicion for ovarian torsion, ectopic pregnancy, tubo-ovarian abscess, nephrolithiasis, pancreatitis, UTI/pyelonephritis, appendicitis.  It is most likely that your pain is secondary to musculoskeletal pain or constipation.  You may take Motrin every 6-8 hours, alternating with acetaminophen every 6-8 hours, so you are taking the medication every 3-4 hours.  Additionally, you may trial MiraLAX for stool softening to assist with your constipation.  We hope you feel better soon

## 2023-04-26 NOTE — ED Triage Notes (Signed)
 Pt here via [facility] for acute onset RLQ abd pain that radiates to RUQ.  Pain began 20 min after eating.  Pt is also on last day of her menstrual cycle. Pt given fentanyl with no relief of symptoms.  138/88 Hr 130  4 zofran 100 fentanyl

## 2023-04-26 NOTE — ED Provider Triage Note (Signed)
 Emergency Medicine Provider Triage Evaluation Note  GLADYCE MCRAY , a 48 y.o. female  was evaluated in triage.  Pt complains of right lower quadrant abdominal pain that began around 6 PM.  Endorsing nausea without vomiting.  Currently on menstrual cycle.  Last bowel movement yesterday.  No dysuria, fevers.  Review of Systems  Positive:  Negative:   Physical Exam  BP (!) 152/83 (BP Location: Right Arm)   Pulse (!) 114   Temp 98.7 F (37.1 C) (Oral)   Resp 18   Ht 5' 5.5" (1.664 m)   Wt 91.1 kg   LMP 04/23/2023   SpO2 100%   BMI 32.91 kg/m  Gen:   Awake, no distress   Resp:  Normal effort  MSK:   Moves extremities without difficulty  Other:    Medical Decision Making  Medically screening exam initiated at 7:34 PM.  Appropriate orders placed.  Jamya A Herrera was informed that the remainder of the evaluation will be completed by another provider, this initial triage assessment does not replace that evaluation, and the importance of remaining in the ED until their evaluation is complete.     Al Decant, PA-C 04/26/23 1935

## 2023-04-26 NOTE — ED Provider Notes (Signed)
 [place] EMERGENCY DEPARTMENT AT [facility] Provider Note   CSN: [id number] Arrival date & time: 04/26/23  1911     History  No chief complaint on file.   Janet [name] is a 48 y.o. female who presents to the ED for acute onset right lower quadrant abdominal pain that began this evening while she was standing in the kitchen.  Patient reports that she initially felt a cramping-like sensation in the right lower quadrant.  However, she has not had menstrual cramps in years.  Patient is currently on her menstrual period which started 4 to 5 days ago.   Denies that she could currently be pregnant as she her tubes are tied.  Last bowel movement was yesterday, when she had 2.  Patient frequently monitors her bowel movements that she has a history of constipation.  No recent fevers or viral illnesses.  No prior episodes of emesis or diarrhea.  HPI  PMHx of lower back pain, hypothyroidism, prediabetes, constipation     Home Medications Prior to Admission medications   Medication Sig Start Date End Date Taking? Authorizing Provider  albuterol (VENTOLIN HFA) 108 (90 Base) MCG/ACT inhaler Inhale 2 puffs into the lungs every 6 (six) hours as needed for wheezing or shortness of breath. Patient not taking: Reported on 04/14/2023 01/21/23   Marcelyn Bruins, MD  cholecalciferol (VITAMIN D3) 25 MCG (1000 UNIT) tablet 1 tablet    [provider]  levocetirizine (XYZAL) 5 MG tablet Take 1 tablet (5 mg total) by mouth every evening. 01/21/23   Marcelyn Bruins, MD  methimazole (TAPAZOLE) 5 MG tablet Take 1 tablet (5 mg total) by mouth as directed. 1 tablet Monday through Saturday and none on Sundays 04/15/23   Shamleffer, Konrad Dolores, MD  Olopatadine-Mometasone [facility]) [phone] MCG/ACT SUSP 2 sprays each nostril twice a day as needed for congestion or drainage (throat clearing, cough) Patient not taking: Reported on 04/14/2023 01/21/23   Marcelyn Bruins, MD  valACYclovir (VALTREX) 500 MG tablet Take 1 tablet (500 mg total) by mouth 2 (two) times daily at onset of symptoms for 3 days. 09/24/22   Nche, Bonna Gains, NP      Allergies    Sulfa antibiotics    Review of Systems   Review of Systems  Physical Exam Updated Vital Signs BP 119/61   Pulse 93   Temp 98.7 F (37.1 C) (Oral)   Resp 18   Ht 5' 5.5" (1.664 m)   Wt 91.1 kg   LMP 04/23/2023   SpO2 100%   BMI 32.91 kg/m  Physical Exam Vitals and nursing note reviewed.  Constitutional:      General: She is in acute distress.     Appearance: Normal appearance.  HENT:     Head: Normocephalic and atraumatic.  Cardiovascular:     Rate and Rhythm: Normal rate and regular rhythm.  Pulmonary:     Effort: Pulmonary effort is normal.     Breath sounds: Normal breath sounds.  Abdominal:     Palpations: Abdomen is soft.     Tenderness: There is abdominal tenderness in the right lower quadrant. There is no right CVA tenderness or left CVA tenderness. Negative signs include Murphy's sign and McBurney's sign.     Hernia: There is no hernia in the right femoral area or right inguinal area.  Skin:    General: Skin is warm and dry.     Capillary Refill: Capillary refill takes less than 2 seconds.  Findings: No abrasion, abscess or laceration.  Neurological:     Mental Status: She is alert.  Psychiatric:        Behavior: Behavior is cooperative.    ED Results / Procedures / Treatments   Labs (all labs ordered are listed, but only abnormal results are displayed) Labs Reviewed  LIPASE, BLOOD - Abnormal; Notable for the following components:      Result Value   Lipase 71 (*)    All other components within normal limits  COMPREHENSIVE METABOLIC PANEL - Abnormal; Notable for the following components:   CO2 18 (*)    Glucose, Bld 168 (*)    Total Protein 6.4 (*)    Albumin 3.3 (*)    All other components within normal limits  CBC - Abnormal; Notable for the following  components:   Hemoglobin 11.5 (*)    HCT 35.9 (*)    All other components within normal limits  URINALYSIS, ROUTINE W REFLEX MICROSCOPIC - Abnormal; Notable for the following components:   Color, Urine RED (*)    APPearance HAZY (*)    Hgb urine dipstick LARGE (*)    Protein, ur 100 (*)    Leukocytes,Ua TRACE (*)    All other components within normal limits  HCG, SERUM, QUALITATIVE    EKG None  Radiology US PELVIC COMPLETE W TRANSVAGINAL AND TORSION R/O Result Date: 04/26/2023 CLINICAL DATA:  Abdominal pain EXAM: TRANSABDOMINAL AND TRANSVAGINAL ULTRASOUND OF PELVIS DOPPLER ULTRASOUND OF OVARIES TECHNIQUE: Both transabdominal and transvaginal ultrasound examinations of the pelvis were performed. Transabdominal technique was performed for global imaging of the pelvis including uterus, ovaries, adnexal regions, and pelvic cul-de-sac. It was necessary to proceed with endovaginal exam following the transabdominal exam to visualize the endometrium and ovaries. Color and duplex Doppler ultrasound was utilized to evaluate blood flow to the ovaries. COMPARISON:  CT abdomen pelvis earlier today FINDINGS: Uterus Measurements: 8.5 x 5.0 x 5.9 cm = volume: 131 mL. Uterine fibroid in the right uterus measuring 1.9 x 1.8 x 1.6 cm. Additional urine fibroid in the right uterine fundus measuring 1.0 x 0.9 x 0.9 cm. Endometrium Thickness: 6 mm.  No focal abnormality visualized. Right ovary Measurements: 3.4 x 1.9 x 1.5 cm = volume: 5.2 mL. Normal appearance/no adnexal mass. Left ovary Measurements: 3.1 x 1.6 x 1.4 cm = volume: 3.6 mL. Normal appearance/no adnexal mass. Pulsed Doppler evaluation of both ovaries demonstrates normal low-resistance arterial and venous waveforms. Other findings No abnormal free fluid. IMPRESSION: 1. No evidence of ovarian torsion. 2. Small uterine fibroids. Electronically Signed   By: Minerva Fester M.D.   On: 04/26/2023 21:59   CT ABDOMEN PELVIS W CONTRAST Result Date:  04/26/2023 CLINICAL DATA:  Right lower quadrant pain EXAM: CT ABDOMEN AND PELVIS WITH CONTRAST TECHNIQUE: Multidetector CT imaging of the abdomen and pelvis was performed using the standard protocol following bolus administration of intravenous contrast. RADIATION DOSE REDUCTION: This exam was performed according to the departmental dose-optimization program which includes automated exposure control, adjustment of the mA and/or kV according to patient size and/or use of iterative reconstruction technique. CONTRAST:  75mL OMNIPAQUE IOHEXOL 350 MG/ML SOLN COMPARISON:  07/18/2021 FINDINGS: Lower chest: No acute abnormality Hepatobiliary: Gallbladder is contracted. No biliary ductal dilatation or focal hepatic abnormality. Pancreas: No focal abnormality or ductal dilatation. Spleen: No focal abnormality.  Normal size. Adrenals/Urinary Tract: No adrenal abnormality. No focal renal abnormality. No stones or hydronephrosis. Urinary bladder is unremarkable. Stomach/Bowel: Normal appendix. Stomach, large and small bowel grossly  unremarkable. Vascular/Lymphatic: No evidence of aneurysm or adenopathy. Reproductive: Uterus and adnexa unremarkable.  No mass. Other: No free fluid or free air. Musculoskeletal: No acute bony abnormality. IMPRESSION: No acute findings in the abdomen or pelvis. Electronically Signed   By: Charlett Nose M.D.   On: 04/26/2023 21:52    Procedures Procedures    Medications Ordered in ED Medications  ketorolac (TORADOL) 15 MG/ML injection 15 mg (has no administration in time range)  HYDROmorphone (DILAUDID) injection 0.5 mg (0.5 mg Intravenous Given 04/26/23 2004)  HYDROmorphone (DILAUDID) injection 0.5 mg (0.5 mg Intravenous Given 04/26/23 2042)  iohexol (OMNIPAQUE) 350 MG/ML injection 75 mL (75 mLs Intravenous Contrast Given 04/26/23 2150)    ED Course/ Medical Decision Making/ A&P                                 Medical Decision Making Amount and/or Complexity of Data Reviewed Labs:  ordered. Radiology: ordered.  Risk Prescription drug management.   48 year old female with a PMHx of lower back pain, hypothyroidism, prediabetes, constipation who presents for acute onset right lower quadrant abdominal pain.  Initial concern for ovarian torsion, ruptured ovarian cyst, ectopic pregnancy, TOA, nephrolithiasis, pancreatitis, UTI/pyelonephritis, appendicitis, constipation.  Patient is afebrile with no leukocytosis therefore low suspicion for TOA, pancreatitis, UTI/pyelonephritis, or appendicitis.  Urinalysis does not appear infectious, therefore lowering suspicion for UTI/pyelonephritis.  Lipase is 71, which is elevated, but lower than patient's prior 2 years.  Patient has a negative pregnancy test, thus low suspicion for ectopic pregnancy.  Emergently ordered a transvaginal ultrasound for evaluation of ovarian torsion which shows no evidence of ovarian torsion with Doppler evaluation of both ovaries.  CT abdomen and pelvis shows no evidence of acute findings.  Pancreas does not appear to be inflamed, normal appendix visualized.  No nephrolithiasis or secondary effects such as hydronephrosis were visualized on CT scan. Severe constipation is not visualized.  Findings were discussed with patient, significant other, and patient's mom at bedside.  Patient's pain is improved since arrival with 0.5 Dilaudid x 2, however patient is still endorsing right lower quadrant abdominal pain.  Will trial IV Toradol with concern for an MSK pathology as a cause of her pain.  Patient's vital signs were reassuring with no fever, normotensive, appropriate heart rate respiratory rate and continuing to sat well on room air.  Likely the patient's symptoms are secondary to MSK pathology rales constipation.  Advised patient to take Motrin and acetaminophen in alterations for pain control.  Additionally advised recommendation for stool softener given history of constipation.  Patient will be discharged in  hemodynamically stable condition.  Final Clinical Impression(s) / ED Diagnoses Final diagnoses:  Right lower quadrant abdominal pain    Rx / DC Orders ED Discharge Orders     None      Renella Cunas, PGY 2 Emergency medicine   Renella Cunas, MD 04/26/23 2340    Pricilla Loveless, MD 04/30/23 [phone]

## 2023-06-17 ENCOUNTER — Other Ambulatory Visit: Payer: Self-pay

## 2023-06-19 ENCOUNTER — Other Ambulatory Visit (HOSPITAL_COMMUNITY): Payer: Self-pay

## 2023-07-14 ENCOUNTER — Ambulatory Visit: Admitting: Family Medicine

## 2023-07-14 ENCOUNTER — Encounter: Payer: Self-pay | Admitting: Family Medicine

## 2023-07-14 VITALS — BP 118/70 | HR 94 | Temp 97.4°F | Ht 65.0 in | Wt 199.6 lb

## 2023-07-14 DIAGNOSIS — S161XXA Strain of muscle, fascia and tendon at neck level, initial encounter: Secondary | ICD-10-CM | POA: Diagnosis not present

## 2023-07-14 MED ORDER — METHOCARBAMOL 500 MG PO TABS: 500.0000 mg | ORAL_TABLET | Freq: Three times a day (TID) | ORAL | 0 refills | Status: DC | PRN

## 2023-07-14 MED ORDER — MELOXICAM 7.5 MG PO TABS: 7.5000 mg | ORAL_TABLET | Freq: Every day | ORAL | 0 refills | Status: DC

## 2023-07-14 NOTE — Progress Notes (Signed)
 Established Patient Office Visit   Subjective:  Patient ID: Janet [name], female    DOB: April 01, 1975  Age: 48 y.o. MRN: [id number]  Chief Complaint  Patient presents with   Neck Pain    Right sided aching neck pain that has lasted the past two weeks says she has taken otc meds but nothing is working    Neck Pain  Pertinent negatives include no chest pain, tingling or weakness.   Encounter Diagnoses  Name Primary?   Strain of neck muscle, initial encounter Yes   For evaluation of a nagging soreness on the right side of her neck over the last few weeks.  There has been no injury.  No radiation of pain.  No numbness tingling or weakness.  There has been some nausea.  She denies chest pain or diaphoresis.  She is not currently physically active and does experience some shortness of breath with exertion.  She has a family history of coronary artery disease in her mother and is subsequently concerned about her own heart.  Recent lipid profile showed an LDL cholesterol of 80 with an HDL of 66.  She does not smoke.   Review of Systems  Constitutional: Negative.  Negative for diaphoresis.  HENT: Negative.    Eyes:  Negative for blurred vision, discharge and redness.  Respiratory: Negative.    Cardiovascular:  Negative for chest pain.  Gastrointestinal:  Positive for nausea. Negative for abdominal pain, heartburn and vomiting.  Genitourinary: Negative.   Musculoskeletal:  Positive for neck pain. Negative for myalgias.  Skin:  Negative for rash.  Neurological:  Negative for tingling, loss of consciousness and weakness.  Endo/Heme/Allergies:  Negative for polydipsia.     Current Outpatient Medications:    cholecalciferol (VITAMIN D3) 25 MCG (1000 UNIT) tablet, 1 tablet, Disp: , Rfl:    levocetirizine (XYZAL ) 5 MG tablet, Take 1 tablet (5 mg total) by mouth every evening., Disp: 30 tablet, Rfl: 5   meloxicam  (MOBIC ) 7.5 MG tablet, Take 1 tablet (7.5 mg total) by mouth daily., Disp: 30  tablet, Rfl: 0   methimazole  (TAPAZOLE ) 5 MG tablet, Take 1 tablet (5 mg total) by mouth as directed. 1 tablet Monday through Saturday and none on Sundays, Disp: 78 tablet, Rfl: 3   methocarbamol  (ROBAXIN ) 500 MG tablet, Take 1 tablet (500 mg total) by mouth every 8 (eight) hours as needed., Disp: 30 tablet, Rfl: 0   valACYclovir  (VALTREX ) 500 MG tablet, Take 1 tablet (500 mg total) by mouth 2 (two) times daily at onset of symptoms for 3 days., Disp: 30 tablet, Rfl: 3   albuterol  (VENTOLIN  HFA) 108 (90 Base) MCG/ACT inhaler, Inhale 2 puffs into the lungs every 6 (six) hours as needed for wheezing or shortness of breath. (Patient not taking: Reported on 02/09/2023), Disp: 6.7 g, Rfl: 1   Olopatadine-Mometasone (RYALTRIS ) 665-25 MCG/ACT SUSP, 2 sprays each nostril twice a day as needed for congestion or drainage (throat clearing, cough) (Patient not taking: Reported on 07/14/2023), Disp: 29 g, Rfl: 5   Objective:     BP 118/70 (BP Location: Left Arm, Patient Position: Sitting)   Pulse 94   Temp (!) 97.4 F (36.3 C) (Temporal)   Ht 5\' 5"  (1.651 m)   Wt 199 lb 9.6 oz (90.5 kg)   LMP 06/20/2023 (Exact Date)   SpO2 97%   BMI 33.22 kg/m    Physical Exam Constitutional:      General: She is not in acute distress.    Appearance:  Normal appearance. She is not ill-appearing, toxic-appearing or diaphoretic.  HENT:     Head: Normocephalic and atraumatic.     Right Ear: External ear normal.     Left Ear: External ear normal.  Eyes:     General: No scleral icterus.       Right eye: No discharge.        Left eye: No discharge.     Extraocular Movements: Extraocular movements intact.     Conjunctiva/sclera: Conjunctivae normal.  Cardiovascular:     Rate and Rhythm: Normal rate and regular rhythm.  Pulmonary:     Effort: Pulmonary effort is normal. No respiratory distress.     Breath sounds: Normal breath sounds. No wheezing, rhonchi or rales.  Musculoskeletal:     Cervical back: Normal range  of motion. Tenderness present. No rigidity, spasms or bony tenderness. No pain with movement. Normal range of motion.       Back:  Lymphadenopathy:     Cervical: No cervical adenopathy.  Skin:    General: Skin is warm and dry.  Neurological:     Mental Status: She is alert and oriented to person, place, and time.  Psychiatric:        Mood and Affect: Mood normal.        Behavior: Behavior normal.      No results found for any visits on 07/14/23.    The 10-year ASCVD risk score (Arnett DK, et al., 2019) is: 0.5%    Assessment & Plan:   Strain of neck muscle, initial encounter -     Meloxicam ; Take 1 tablet (7.5 mg total) by mouth daily.  Dispense: 30 tablet; Refill: 0 -     Methocarbamol ; Take 1 tablet (500 mg total) by mouth every 8 (eight) hours as needed.  Dispense: 30 tablet; Refill: 0    Return Schedule follow-up with Janet [name] in a few weeks..  Explained that her symptoms were more consistent with a cervical strain.  Will start meloxicam  with Robaxin  as needed.  Exercises were given.  Offered cardiology referral for reassurance (out of concern over her mother's heart history).  She would like to give the medications a chance to help.  Therefore recommended that she follow-up with her primary in a few weeks for recheck.  Janet Frederic, MD

## 2023-07-15 ENCOUNTER — Other Ambulatory Visit: Payer: Self-pay | Admitting: Nurse Practitioner

## 2023-07-15 DIAGNOSIS — Z1231 Encounter for screening mammogram for malignant neoplasm of breast: Secondary | ICD-10-CM

## 2023-07-16 ENCOUNTER — Ambulatory Visit: Admitting: Family Medicine

## 2023-07-21 ENCOUNTER — Other Ambulatory Visit (HOSPITAL_COMMUNITY): Payer: Self-pay

## 2023-08-04 ENCOUNTER — Ambulatory Visit
Admission: RE | Admit: 2023-08-04 | Discharge: 2023-08-04 | Disposition: A | Discharge status: Home / Self Care | Source: Ambulatory Visit

## 2023-08-04 DIAGNOSIS — Z1231 Encounter for screening mammogram for malignant neoplasm of breast: Secondary | ICD-10-CM | POA: Diagnosis not present

## 2023-08-07 ENCOUNTER — Ambulatory Visit: Payer: Self-pay | Admitting: Nurse Practitioner

## 2023-08-07 ENCOUNTER — Other Ambulatory Visit: Payer: Self-pay | Admitting: Nurse Practitioner

## 2023-08-07 DIAGNOSIS — N6311 Unspecified lump in the right breast, upper outer quadrant: Secondary | ICD-10-CM

## 2023-08-13 ENCOUNTER — Other Ambulatory Visit: Payer: Self-pay

## 2023-08-13 ENCOUNTER — Emergency Department (HOSPITAL_COMMUNITY)
Admission: EM | Admit: 2023-08-13 | Discharge: 2023-08-14 | Disposition: A | Discharge status: Home / Self Care | Attending: Emergency Medicine | Admitting: Emergency Medicine

## 2023-08-13 DIAGNOSIS — R29818 Other symptoms and signs involving the nervous system: Secondary | ICD-10-CM | POA: Diagnosis not present

## 2023-08-13 DIAGNOSIS — R519 Headache, unspecified: Secondary | ICD-10-CM | POA: Diagnosis not present

## 2023-08-13 MED ORDER — OXYCODONE-ACETAMINOPHEN 5-325 MG PO TABS
1.0000 | ORAL_TABLET | Freq: Once | ORAL | Status: AC
  Administered 2023-08-13: 1 via ORAL
  Filled 2023-08-13: qty 1

## 2023-08-13 NOTE — ED Triage Notes (Signed)
 Patient reports intermittent left temporal headache for several weeks unrelieved by OTC pain medications , denies head injury.

## 2023-08-14 ENCOUNTER — Emergency Department (HOSPITAL_COMMUNITY)

## 2023-08-14 DIAGNOSIS — R29818 Other symptoms and signs involving the nervous system: Secondary | ICD-10-CM | POA: Diagnosis not present

## 2023-08-14 DIAGNOSIS — R519 Headache, unspecified: Secondary | ICD-10-CM | POA: Diagnosis not present

## 2023-08-14 LAB — COMPREHENSIVE METABOLIC PANEL WITH GFR
ALT: 21 U/L (ref 0–44)
AST: 18 U/L (ref 15–41)
Albumin: 3.7 g/dL (ref 3.5–5.0)
Alkaline Phosphatase: 60 U/L (ref 38–126)
Anion gap: 6 (ref 5–15)
BUN: 12 mg/dL (ref 6–20)
CO2: 22 mmol/L (ref 22–32)
Calcium: 8.7 mg/dL — ABNORMAL LOW (ref 8.9–10.3)
Chloride: 106 mmol/L (ref 98–111)
Creatinine, Ser: 0.83 mg/dL (ref 0.44–1.00)
GFR, Estimated: >60 mL/min (ref >60)
Glucose, Bld: 102 mg/dL — ABNORMAL HIGH (ref 70–99)
Potassium: 3.6 mmol/L (ref 3.5–5.1)
Sodium: 134 mmol/L — ABNORMAL LOW (ref 135–145)
Total Bilirubin: 0.8 mg/dL (ref 0.0–1.2)
Total Protein: 7.2 g/dL (ref 6.5–8.1)

## 2023-08-14 LAB — CBC
HCT: 39.5 % (ref 36.0–46.0)
Hemoglobin: 12.9 g/dL (ref 12.0–15.0)
MCH: 27.4 pg (ref 26.0–34.0)
MCHC: 32.7 g/dL (ref 30.0–36.0)
MCV: 83.9 fL (ref 80.0–100.0)
Platelets: 423 K/uL — ABNORMAL HIGH (ref 150–400)
RBC: 4.71 MIL/uL (ref 3.87–5.11)
RDW: 14.7 % (ref 11.5–15.5)
WBC: 8.3 K/uL (ref 4.0–10.5)
nRBC: 0 % (ref 0.0–0.2)

## 2023-08-14 MED ORDER — PROCHLORPERAZINE MALEATE 10 MG PO TABS: 10.0000 mg | ORAL_TABLET | Freq: Two times a day (BID) | ORAL | 0 refills | Status: DC | PRN

## 2023-08-14 MED ORDER — SODIUM CHLORIDE 0.9 % IV BOLUS
1000.0000 mL | Freq: Once | INTRAVENOUS | Status: AC
  Administered 2023-08-14: 1000 mL via INTRAVENOUS

## 2023-08-14 MED ORDER — IOHEXOL 350 MG/ML SOLN
75.0000 mL | Freq: Once | INTRAVENOUS | Status: AC | PRN
  Administered 2023-08-14: 75 mL via INTRAVENOUS

## 2023-08-14 MED ORDER — NAPROXEN 500 MG PO TABS: 500.0000 mg | ORAL_TABLET | Freq: Two times a day (BID) | ORAL | 0 refills | Status: DC

## 2023-08-14 MED ORDER — DIPHENHYDRAMINE HCL 50 MG/ML IJ SOLN
25.0000 mg | Freq: Once | INTRAMUSCULAR | Status: AC
  Administered 2023-08-14: 25 mg via INTRAVENOUS
  Filled 2023-08-14: qty 1

## 2023-08-14 MED ORDER — PROCHLORPERAZINE EDISYLATE 10 MG/2ML IJ SOLN
10.0000 mg | Freq: Once | INTRAMUSCULAR | Status: AC
  Administered 2023-08-14: 10 mg via INTRAVENOUS
  Filled 2023-08-14: qty 2

## 2023-08-14 NOTE — ED Notes (Signed)
 Patient dc by RN. In wheelchair to lobby with no additional questions for RN.

## 2023-08-14 NOTE — Discharge Instructions (Signed)
 You were evaluated in the Emergency Department and after careful evaluation, we did not find any emergent condition requiring admission or further testing in the hospital.  Your exam/testing today is overall reassuring.  Recommend follow-up with neurology to discuss your headaches.  Can use the Naprosyn  twice daily as needed for pain.  Can also use the Compazine  as needed for headache or nausea.  Please return to the Emergency Department if you experience any worsening of your condition.   Thank you for allowing us  to be a part of your care.

## 2023-08-14 NOTE — ED Provider Notes (Addendum)
 [facility]PT [facility] Emergency Department Provider Note MRN:  [id number]  Arrival date & time: 08/14/23     Chief Complaint   Headache   History of Present Illness   Janet [name] is a 48 y.o. year-old female with no pertinent past medical history presenting to the ED with chief complaint of headache.  Sudden onset left-sided headache different from her typical headaches.  Has been having increased headaches for the past several weeks.  No vision loss or vision changes, no numbness or weakness to the arms or legs, no fever.  Review of Systems  A thorough review of systems was obtained and all systems are negative except as noted in the HPI and PMH.   Patient's Health History    Past Medical History:  Diagnosis Date   Abdominal tenderness 11/25/2012   Back pain 11/25/2012   Dyspnea 04/25/2011   Gastroenteritis 04/16/2017   HYPERSOMNIA 04/10/2010   HYPERTHYROIDISM 08/03/2006   Low vitamin D  level 2019   level = 7    Past Surgical History:  Procedure Laterality Date   CESAREAN SECTION     x3   DILATION AND CURETTAGE OF UTERUS     TUBAL LIGATION      Family History  Problem Relation Age of Onset   Hypertension Mother    Hyperlipidemia Mother    Sleep apnea Mother    Thyroid  disease Mother    Stroke Mother    Cancer Other        Aunt with Cancer- unknown type   Lupus Other        Uncle   Cancer Other        Lung Cancer-Uncle   Breast cancer Neg Hx     Social History   Socioeconomic History   Marital status: Married    Spouse name: Not on file   Number of children: 3   Years of education: Not on file   Highest education level: Associate degree: occupational, Scientist, product/process development, or vocational program  Occupational History   Not on file  Tobacco Use   Smoking status: Never    Passive exposure: Past   Smokeless tobacco: Never  Vaping Use   Vaping status: Never Used  Substance and Sexual Activity   Alcohol use: No    Alcohol/week: 0.0 standard drinks  of alcohol   Drug use: No   Sexual activity: Yes    Partners: Male    Birth control/protection: Surgical    Comment: Tubal  Other Topics Concern   Not on file  Social History Narrative   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   Social Drivers of Health   Financial Resource Strain: Low Risk  (12/22/2022)   Overall Financial Resource Strain (CARDIA)    Difficulty of Paying Living Expenses: Not hard at all  Food Insecurity: No Food Insecurity (12/22/2022)   Hunger Vital Sign    Worried About Running Out of Food in the Last Year: Never true    Ran Out of Food in the Last Year: Never true  Transportation Needs: No Transportation Needs (12/22/2022)   PRAPARE - Administrator, Civil Service (Medical): No    Lack of Transportation (Non-Medical): No  Physical Activity: Inactive (12/22/2022)   Exercise Vital Sign    Days of Exercise per Week: 0 days    Minutes of Exercise per Session: 20 min  Stress: No Stress Concern Present (12/22/2022)   [employer] of Occupational Health - Occupational Stress Questionnaire    Feeling of Stress : Not  at all  Social Connections: Socially Integrated (12/22/2022)   Social Connection and Isolation Panel    Frequency of Communication with Friends and Family: More than three times a week    Frequency of Social Gatherings with Friends and Family: Patient declined    Attends Religious Services: More than 4 times per year    Active Member of [employer] or Organizations: Yes    Attends Banker Meetings: Patient declined    Marital Status: Married  Catering manager Violence: Not on file     Physical Exam   Vitals:   08/14/23 0700 08/14/23 0715  BP: 120/61 123/67  Pulse: 85 81  Resp:    Temp:    SpO2: 100% 100%    CONSTITUTIONAL: Well-appearing, NAD NEURO/PSYCH:  Alert and oriented x 3, no focal deficits EYES:  eyes equal and reactive ENT/NECK:  no LAD, no JVD CARDIO: Regular rate, well-perfused, normal S1 and S2 PULM:  CTAB no wheezing or  rhonchi GI/GU:  non-distended, non-tender MSK/SPINE:  No gross deformities, no edema SKIN:  no rash, atraumatic   *Additional and/or pertinent findings included in MDM below  Diagnostic and Interventional Summary    EKG Interpretation Date/Time:    Ventricular Rate:    PR Interval:    QRS Duration:    QT Interval:    QTC Calculation:   R Axis:      Text Interpretation:         Labs Reviewed  CBC - Abnormal; Notable for the following components:      Result Value   Platelets 423 (*)    All other components within normal limits  COMPREHENSIVE METABOLIC PANEL WITH GFR - Abnormal; Notable for the following components:   Sodium 134 (*)    Glucose, Bld 102 (*)    Calcium 8.7 (*)    All other components within normal limits    CT ANGIO HEAD NECK W WO CM  Final Result      Medications  oxyCODONE -acetaminophen  (PERCOCET/ROXICET) 5-325 MG per tablet 1 tablet (1 tablet Oral Given 08/13/23 2331)  prochlorperazine  (COMPAZINE ) injection 10 mg (10 mg Intravenous Given 08/14/23 0429)  diphenhydrAMINE  (BENADRYL ) injection 25 mg (25 mg Intravenous Given 08/14/23 0429)  sodium chloride  0.9 % bolus 1,000 mL (0 mLs Intravenous Stopped 08/14/23 0621)  iohexol  (OMNIPAQUE ) 350 MG/ML injection 75 mL (75 mLs Intravenous Contrast Given 08/14/23 9376)     Procedures  /  Critical Care Procedures  ED Course and Medical Decision Making  Initial Impression and Ddx Differential diagnosis includes migraine, primary headache disorder, subarachnoid hemorrhage, aneurysmal bleeding  Past medical/surgical history that increases complexity of ED encounter: None  Interpretation of Diagnostics I personally reviewed the Laboratory Testing and my interpretation is as follows: No significant blood count or electrolyte disturbance.  CT normal.  Patient Reassessment and Ultimate Disposition/Management     Feeling better on reassessment, no indication for further testing or admission.  Appropriate for  discharge.  Patient management required discussion with the following services or consulting groups:  None  Complexity of Problems Addressed Acute illness or injury that poses threat of life of bodily function  Additional Data Reviewed and Analyzed Further history obtained from: None  Additional Factors Impacting ED Encounter Risk Consideration of hospitalization  Ozell HERO. Theadore, MD [facility] Emergency Medicine [facility] [email or web address]  Final Clinical Impressions(s) / ED Diagnoses     ICD-10-CM   1. Acute nonintractable headache, unspecified headache type  R51.9 Ambulatory referral to Neurology  ED Discharge Orders          Ordered    naproxen  (NAPROSYN ) 500 MG tablet  2 times daily        08/14/23 0724    prochlorperazine  (COMPAZINE ) 10 MG tablet  2 times daily PRN        08/14/23 0724    Ambulatory referral to Neurology       Comments: An appointment is requested in approximately: 2 weeks   08/14/23 0724             Discharge Instructions Discussed with and Provided to Patient:     Discharge Instructions      You were evaluated in the Emergency Department and after careful evaluation, we did not find any emergent condition requiring admission or further testing in the hospital.  Your exam/testing today is overall reassuring.  Recommend follow-up with neurology to discuss your headaches.  Can use the Naprosyn  twice daily as needed for pain.  Can also use the Compazine  as needed for headache or nausea.  Please return to the Emergency Department if you experience any worsening of your condition.   Thank you for allowing us  to be a part of your care.       Theadore Ozell HERO, MD 08/14/23 9382    Theadore Ozell HERO, MD 08/14/23 [id number]

## 2023-08-18 ENCOUNTER — Inpatient Hospital Stay
Admission: RE | Admit: 2023-08-18 | Discharge: 2023-08-18 | Discharge status: Home / Self Care | Source: Ambulatory Visit | Attending: Nurse Practitioner

## 2023-08-18 ENCOUNTER — Ambulatory Visit
Admission: RE | Admit: 2023-08-18 | Discharge: 2023-08-18 | Disposition: A | Discharge status: Home / Self Care | Source: Ambulatory Visit | Attending: Nurse Practitioner | Admitting: Nurse Practitioner

## 2023-08-18 DIAGNOSIS — N6002 Solitary cyst of left breast: Secondary | ICD-10-CM | POA: Diagnosis not present

## 2023-08-18 DIAGNOSIS — N6311 Unspecified lump in the right breast, upper outer quadrant: Secondary | ICD-10-CM

## 2023-08-18 DIAGNOSIS — N6310 Unspecified lump in the right breast, unspecified quadrant: Secondary | ICD-10-CM | POA: Diagnosis not present

## 2023-08-18 DIAGNOSIS — N6001 Solitary cyst of right breast: Secondary | ICD-10-CM | POA: Diagnosis not present

## 2023-08-26 ENCOUNTER — Encounter

## 2023-08-26 ENCOUNTER — Other Ambulatory Visit

## 2023-09-04 ENCOUNTER — Encounter: Payer: Self-pay | Admitting: Nurse Practitioner

## 2023-09-04 ENCOUNTER — Ambulatory Visit: Payer: Self-pay | Admitting: Nurse Practitioner

## 2023-09-04 ENCOUNTER — Ambulatory Visit: Admitting: Nurse Practitioner

## 2023-09-04 VITALS — BP 118/76 | HR 83 | Temp 98.4°F | Ht 65.75 in | Wt 203.6 lb

## 2023-09-04 DIAGNOSIS — Z23 Encounter for immunization: Secondary | ICD-10-CM

## 2023-09-04 DIAGNOSIS — Z0001 Encounter for general adult medical examination with abnormal findings: Secondary | ICD-10-CM | POA: Diagnosis not present

## 2023-09-04 DIAGNOSIS — Z136 Encounter for screening for cardiovascular disorders: Secondary | ICD-10-CM

## 2023-09-04 DIAGNOSIS — B009 Herpesviral infection, unspecified: Secondary | ICD-10-CM | POA: Diagnosis not present

## 2023-09-04 DIAGNOSIS — E559 Vitamin D deficiency, unspecified: Secondary | ICD-10-CM | POA: Diagnosis not present

## 2023-09-04 DIAGNOSIS — Z13220 Encounter for screening for lipoid disorders: Secondary | ICD-10-CM | POA: Diagnosis not present

## 2023-09-04 DIAGNOSIS — R7303 Prediabetes: Secondary | ICD-10-CM

## 2023-09-04 LAB — LIPID PANEL
Cholesterol: 174 mg/dL (ref 0–200)
HDL: 63.1 mg/dL (ref >39.00)
LDL Cholesterol: 93 mg/dL (ref 0–99)
NonHDL: 110.66
Total CHOL/HDL Ratio: 3
Triglycerides: 86 mg/dL (ref 0.0–149.0)
VLDL: 17.2 mg/dL (ref 0.0–40.0)

## 2023-09-04 LAB — VITAMIN D 25 HYDROXY (VIT D DEFICIENCY, FRACTURES): VITD: 35.24 ng/mL (ref 30.00–100.00)

## 2023-09-04 LAB — HEMOGLOBIN A1C: Hgb A1c MFr Bld: 6.2 % (ref 4.6–6.5)

## 2023-09-04 MED ORDER — VALACYCLOVIR HCL 500 MG PO TABS: 500.0000 mg | ORAL_TABLET | Freq: Two times a day (BID) | ORAL | 0 refills | Status: AC

## 2023-09-04 MED ORDER — NAPROXEN 500 MG PO TABS: 500.0000 mg | ORAL_TABLET | Freq: Two times a day (BID) | ORAL | 0 refills | Status: DC | PRN

## 2023-09-04 NOTE — Patient Instructions (Signed)
 Go to lab Maintain Heart healthy diet and daily exercise. Maintain current medications.

## 2023-09-04 NOTE — Assessment & Plan Note (Signed)
Repeat [id number] 

## 2023-09-04 NOTE — Assessment & Plan Note (Signed)
 Repeat vit D

## 2023-09-04 NOTE — Progress Notes (Signed)
 Complete physical exam  Patient: Janet [name]   DOB: [birth year]   48 y.o. Female  MRN: [id number] Visit Date: 09/04/2023  Subjective:    Chief Complaint  Patient presents with   Annual Exam    FASTING  Refills for naproxen  and valtrex     Janet [name] is a 48 y.o. female who presents today for a complete physical exam. She reports consuming a general diet. Walking and rowing 2x/week She generally feels well. She reports sleeping well. She does not have additional problems to discuss today.  Vision:Yes Dental:Yes STD Screen:No She has upcoming appointment with GYN for repeat PAP  BP Readings from Last 3 Encounters:  09/04/23 118/76  08/14/23 124/68  07/14/23 118/70   Wt Readings from Last 3 Encounters:  09/04/23 203 lb 9.9 oz (92.4 kg)  07/14/23 199 lb 9.6 oz (90.5 kg)  04/26/23 200 lb 12.8 oz (91.1 kg)   Most recent fall risk assessment:    12/22/2022    9:21 AM  Fall Risk   Falls in the past year? 0  Number falls in past yr: 0  Injury with Fall? 0  Risk for fall due to : No Fall Risks  Follow up Falls evaluation completed   Depression screen:Yes - No Depression Most recent depression screenings:    12/22/2022    9:21 AM 10/24/2022    2:01 PM  PHQ 2/9 Scores  PHQ - 2 Score 0 0  PHQ- 9 Score 0 0   HPI  No problem-specific Assessment & Plan notes found for this encounter.  Past Medical History:  Diagnosis Date   Abdominal tenderness 11/25/2012   Back pain 11/25/2012   Dyspnea 04/25/2011   Gastroenteritis 04/16/2017   HYPERSOMNIA 04/10/2010   HYPERTHYROIDISM 08/03/2006   Low vitamin D  level 2019   level = 7   Past Surgical History:  Procedure Laterality Date   CESAREAN SECTION     x3   DILATION AND CURETTAGE OF UTERUS     TUBAL LIGATION     Social History   Socioeconomic History   Marital status: Married    Spouse name: Not on file   Number of children: 3   Years of education: Not on file   Highest education level: Associate degree:  occupational, Scientist, product/process development, or vocational program  Occupational History   Not on file  Tobacco Use   Smoking status: Never    Passive exposure: Past   Smokeless tobacco: Never  Vaping Use   Vaping status: Never Used  Substance and Sexual Activity   Alcohol use: No    Alcohol/week: 0.0 standard drinks of alcohol   Drug use: No   Sexual activity: Yes    Partners: Male    Birth control/protection: Surgical    Comment: Tubal  Other Topics Concern   Not on file  Social History Narrative   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   Social Drivers of Health   Financial Resource Strain: Low Risk  (09/03/2023)   Overall Financial Resource Strain (CARDIA)    Difficulty of Paying Living Expenses: Not hard at all  Food Insecurity: No Food Insecurity (09/03/2023)   Hunger Vital Sign    Worried About Running Out of Food in the Last Year: Never true    Ran Out of Food in the Last Year: Never true  Transportation Needs: No Transportation Needs (09/03/2023)   PRAPARE - Administrator, Civil Service (Medical): No    Lack of Transportation (Non-Medical): No  Physical Activity: Insufficiently  Active (09/03/2023)   Exercise Vital Sign    Days of Exercise per Week: 3 days    Minutes of Exercise per Session: 30 min  Stress: No Stress Concern Present (09/03/2023)   [employer] of Occupational Health - Occupational Stress Questionnaire    Feeling of Stress: Not at all  Social Connections: Socially Integrated (09/03/2023)   Social Connection and Isolation Panel    Frequency of Communication with Friends and Family: More than three times a week    Frequency of Social Gatherings with Friends and Family: Once a week    Attends Religious Services: More than 4 times per year    Active Member of [employer] or Organizations: Yes    Attends Banker Meetings: 1 to 4 times per year    Marital Status: Married  Catering manager Violence: Not on file   Family Status  Relation Name Status   Mother  Alive    Father gunshot Deceased   Brother  Alive   Brother  Alive   Other  (Not Specified)   Other  (Not Specified)   Other  (Not Specified)   Neg Hx  (Not Specified)  No partnership data on file   Family History  Problem Relation Age of Onset   Hypertension Mother    Hyperlipidemia Mother    Sleep apnea Mother    Thyroid  disease Mother    Stroke Mother    Cancer Other        Aunt with Cancer- unknown type   Lupus Other        Uncle   Cancer Other        Lung Cancer-Uncle   Breast cancer Neg Hx    Allergies  Allergen Reactions   Sulfa Antibiotics Shortness Of Breath and Swelling    Patient Care Team: Pura Picinich, Roselie Rockford, NP as PCP - General (Internal Medicine)   Medications: Outpatient Medications Prior to Visit  Medication Sig   cholecalciferol (VITAMIN D3) 25 MCG (1000 UNIT) tablet 1 tablet (Patient taking differently: Take 2,000 Units by mouth daily.)   fexofenadine-pseudoephedrine (ALLEGRA-D 24) 180-240 MG 24 hr tablet Take 1 tablet by mouth daily.   methimazole  (TAPAZOLE ) 5 MG tablet Take 1 tablet (5 mg total) by mouth as directed. 1 tablet Monday through Saturday and none on Sundays   [DISCONTINUED] naproxen  (NAPROSYN ) 500 MG tablet Take 1 tablet (500 mg total) by mouth 2 (two) times daily.   [DISCONTINUED] valACYclovir  (VALTREX ) 500 MG tablet Take 1 tablet (500 mg total) by mouth 2 (two) times daily at onset of symptoms for 3 days.   [DISCONTINUED] albuterol  (VENTOLIN  HFA) 108 (90 Base) MCG/ACT inhaler Inhale 2 puffs into the lungs every 6 (six) hours as needed for wheezing or shortness of breath. (Patient not taking: Reported on 09/04/2023)   [DISCONTINUED] levocetirizine (XYZAL ) 5 MG tablet Take 1 tablet (5 mg total) by mouth every evening. (Patient not taking: Reported on 09/04/2023)   [DISCONTINUED] methocarbamol  (ROBAXIN ) 500 MG tablet Take 1 tablet (500 mg total) by mouth every 8 (eight) hours as needed. (Patient not taking: Reported on 09/04/2023)   [DISCONTINUED]  Olopatadine-Mometasone (RYALTRIS ) 665-25 MCG/ACT SUSP 2 sprays each nostril twice a day as needed for congestion or drainage (throat clearing, cough) (Patient not taking: Reported on 09/04/2023)   [DISCONTINUED] prochlorperazine  (COMPAZINE ) 10 MG tablet Take 1 tablet (10 mg total) by mouth 2 (two) times daily as needed (Nausea or headache). (Patient not taking: Reported on 09/04/2023)   No facility-administered medications prior to visit.  Review of Systems  Constitutional:  Negative for activity change, appetite change and unexpected weight change.  Respiratory: Negative.    Cardiovascular: Negative.   Gastrointestinal: Negative.   Endocrine: Negative for cold intolerance and heat intolerance.  Genitourinary: Negative.   Musculoskeletal: Negative.   Skin: Negative.   Neurological: Negative.   Hematological: Negative.   Psychiatric/Behavioral:  Negative for behavioral problems, decreased concentration, dysphoric mood, hallucinations, self-injury, sleep disturbance and suicidal ideas. The patient is not nervous/anxious.         Objective:  BP 118/76 (BP Location: Left Arm, Patient Position: Sitting, Cuff Size: Large)   Pulse 83   Temp 98.4 F (36.9 C) (Oral)   Ht 5' 5.75 (1.67 m)   Wt 203 lb 9.9 oz (92.4 kg)   LMP 08/21/2023   SpO2 99%   BMI 33.12 kg/m     Physical Exam Vitals and nursing note reviewed.  Cardiovascular:     Rate and Rhythm: Normal rate and regular rhythm.     Pulses: Normal pulses.     Heart sounds: Normal heart sounds.  Pulmonary:     Effort: Pulmonary effort is normal.     Breath sounds: Normal breath sounds.  Musculoskeletal:     Right lower leg: No edema.     Left lower leg: No edema.  Neurological:     Mental Status: She is alert and oriented to person, place, and time.  Psychiatric:        Mood and Affect: Mood normal.        Behavior: Behavior normal.        Thought Content: Thought content normal.     No results found for any visits on  09/04/23.    Assessment & Plan:    Routine Health Maintenance and Physical Exam  Immunization History  Administered Date(s) Administered   Hep A / Hep B 10/25/2013   Hepatitis B, ADULT 04/19/2013, 05/20/2013   Influenza, Seasonal, Injecte, Preservative Fre 10/24/2022   Influenza,inj,Quad PF,6+ Mos 12/08/2014, 11/15/2015, 11/14/2016, 12/02/2018, 12/12/2019, 12/10/2020   PFIZER(Purple Top)SARS-COV-2 Vaccination 02/24/2019, 03/03/2019, 03/24/2019, 06/09/2020   PPD Test 06/07/2013, 11/22/2014, 07/31/2017   Tdap 04/19/2013, 09/04/2023   Health Maintenance  Topic Date Due   Colonoscopy  Never done   COVID-19 Vaccine (5 - 2024-25 season) 10/12/2022   Cervical Cancer Screening (HPV/Pap Cotest)  08/24/2023   Hepatitis C Screening  09/03/2024 (Originally 03/26/1995)   HIV Screening  09/03/2024 (Originally 03/25/1992)   INFLUENZA VACCINE  09/11/2023   DTaP/Tdap/Td (3 - Td or Tdap) 09/03/2033   Hepatitis B Vaccines  Completed   HPV VACCINES  Aged Out   Meningococcal B Vaccine  Aged Out   Discussed health benefits of physical activity, and encouraged her to engage in regular exercise appropriate for her age and condition.  Problem List Items Addressed This Visit     Prediabetes   Relevant Orders   Hemoglobin A1c   Vitamin D  deficiency   Relevant Orders   VITAMIN D  25 Hydroxy (Vit-D Deficiency, Fractures)   Other Visit Diagnoses       Encounter for preventative adult health care exam with abnormal findings    -  Primary     Encounter for lipid screening for cardiovascular disease       Relevant Orders   Lipid panel     HSV (herpes simplex virus) infection       Relevant Medications   valACYclovir  (VALTREX ) 500 MG tablet     Immunization due  Relevant Orders   Tdap vaccine greater than or equal to 7yo IM (Completed)      Return in about 6 months (around 03/06/2024) for prediabetes.     Roselie Mood, NP

## 2023-10-01 ENCOUNTER — Other Ambulatory Visit: Payer: Self-pay | Admitting: Obstetrics and Gynecology

## 2023-10-01 ENCOUNTER — Other Ambulatory Visit (HOSPITAL_COMMUNITY)
Admission: RE | Admit: 2023-10-01 | Discharge: 2023-10-01 | Disposition: A | Discharge status: Home / Self Care | Source: Ambulatory Visit | Attending: Obstetrics and Gynecology | Admitting: Obstetrics and Gynecology

## 2023-10-01 DIAGNOSIS — Z01419 Encounter for gynecological examination (general) (routine) without abnormal findings: Secondary | ICD-10-CM | POA: Insufficient documentation

## 2023-10-05 LAB — CYTOLOGY - PAP
Comment: NEGATIVE
Diagnosis: NEGATIVE
High risk HPV: NEGATIVE

## 2023-10-13 ENCOUNTER — Telehealth: Payer: Self-pay | Admitting: Diagnostic Neuroimaging

## 2023-10-13 NOTE — Telephone Encounter (Signed)
 Pt made request to cx her appointment

## 2023-10-15 ENCOUNTER — Ambulatory Visit: Admitting: Diagnostic Neuroimaging

## 2023-10-15 NOTE — Progress Notes (Signed)
 Name: Janet [name]  MRN/ DOB: [id number], [birth year]    Age/ Sex: 48 y.o., female     PCP: Katheen Roselie Rockford, NP   Reason for Endocrinology Evaluation: Hyperthyroidism     Initial Endocrinology Clinic Visit: 02/27/2012    PATIENT IDENTIFIER: [name] is a 48 y.o., female with a past medical history of hyperthyroidism. She has followed with [facility] since 02/27/2012 for consultative assistance with management of her hyperthyroidism.   HISTORICAL SUMMARY: The patient was first diagnosed with hyperthyroidism in 2007 during pregnancy.  Thyroid  uptake and scan in May 2007 demonstrated diffuse enlargement of the thyroid  gland with a multinodular appearance. A discrete toxic nodule is not demonstrated.   Thyroid  ultrasound in April 2018 showed normal thyroid  with no nodules. Repeat thyroid  ultrasound 2024 did not reveal any nodules   Patient has been on methimazole  since her diagnosis  Mother with thyroid  disease, S/P RAI     SUBJECTIVE:    Today (10/16/2023):  Janet [name] is here for hyperthyroidism.    Weight overall stable  Denies local neck swelling  Denies palpitations  Denies tremors  She has chronic constipation - improving  No eye symptoms    Methimazole  5 mg , 1 tablet Monday  through saturday   HISTORY:  Past Medical History:  Past Medical History:  Diagnosis Date   Abdominal tenderness 11/25/2012   Back pain 11/25/2012   Dyspnea 04/25/2011   Gastroenteritis 04/16/2017   HYPERSOMNIA 04/10/2010   HYPERTHYROIDISM 08/03/2006   Low vitamin D  level 2019   level = 7   Past Surgical History:  Past Surgical History:  Procedure Laterality Date   CESAREAN SECTION     x3   DILATION AND CURETTAGE OF UTERUS     TUBAL LIGATION     Social History:  reports that she has never smoked. She has been exposed to tobacco smoke. She has never used smokeless tobacco. She reports that she does not drink alcohol and does not use drugs. Family  History:  Family History  Problem Relation Age of Onset   Hypertension Mother    Hyperlipidemia Mother    Sleep apnea Mother    Thyroid  disease Mother    Stroke Mother    Cancer Other        Aunt with Cancer- unknown type   Lupus Other        Uncle   Cancer Other        Lung Cancer-Uncle   Breast cancer Neg Hx      HOME MEDICATIONS: Allergies as of 10/16/2023       Reactions   Sulfa Antibiotics Shortness Of Breath, Swelling        Medication List        Accurate as of October 16, 2023  7:41 AM. If you have any questions, ask your nurse or doctor.          cholecalciferol 25 MCG (1000 UNIT) tablet Commonly known as: VITAMIN D3 1 tablet What changed: See the new instructions.   fexofenadine-pseudoephedrine 180-240 MG 24 hr tablet Commonly known as: ALLEGRA-D 24 Take 1 tablet by mouth daily.   methimazole  5 MG tablet Commonly known as: TAPAZOLE  Take 1 tablet (5 mg total) by mouth as directed. 1 tablet Monday through Saturday and none on Sundays   naproxen  500 MG tablet Commonly known as: NAPROSYN  Take 1 tablet (500 mg total) by mouth 2 (two) times daily as needed.   valACYclovir  500 MG tablet Commonly known  as: VALTREX  Take 1 tablet (500 mg total) by mouth 2 (two) times daily at onset of symptoms for 3 days.          OBJECTIVE:   PHYSICAL EXAM: VS: BP 114/70 (BP Location: Left Arm, Patient Position: Sitting, Cuff Size: Normal)   Pulse 87   Ht 5' 5.75 (1.67 m)   Wt 198 lb 6.4 oz (90 kg)   SpO2 97%   BMI 32.27 kg/m    EXAM: General: Pt appears well and is in NAD  Eyes: External eye exam normal without stare, lid lag or exophthalmos.  EOM intact.    Neck: General: Supple without adenopathy. Thyroid : Thyroid  size normal.  Right thyroid  asymmetry noted   Lungs: Clear with good BS bilat  Heart: Auscultation: RRR.  Extremities:  BL LE: No pretibial edema   Mental Status: Judgment, insight: Intact Orientation: Oriented to time, place, and  person Mood and affect: No depression, anxiety, or agitation     DATA REVIEWED:   Latest Reference Range & Units 10/16/23 07:54  TSH mIU/L 1.66  Triiodothyronine,Free,Serum 2.3 - 4.2 pg/mL 3.5  T4,Free(Direct) 0.8 - 1.8 ng/dL 1.2     Thyroid  Ultrasound 10/17/2022   Estimated total number of nodules >/= 1 cm: 0   Number of spongiform nodules >/=  2 cm not described below (TR1): 0   Number of mixed cystic and solid nodules >/= 1.5 cm not described below (TR2): 0   _________________________________________________________   No discrete nodules are seen within the thyroid  gland. Positive increased flow on color Doppler imaging consistent with hypervascularity.   IMPRESSION: Mildly enlarged, heterogeneous and hypervascular thyroid  gland. In the setting of hyperthyroidism, differential considerations include Graves disease and active thyroiditis.   No discrete thyroid  nodules.   ASSESSMENT / PLAN / RECOMMENDATIONS:   Hyperthyroidism:  -Patient is clinically euthyroid -Thyroid  uptake and scan in 2007 showed multinodular goiter, thyroid  ultrasound in 2018 and 2024 did not show any evidence of nodules  - Suspect Graves' disease  -Repeat TFTs remain within normal range, no changes at this time  Medications  Continue methimazole  5 mg, 1 tablet Monday through Saturday and none on Sundays   Follow-up in 6 months   Signed electronically by: Stefano Redgie Butts, MD  [facility] End[facility]ology  [facility] [address] [place] [place], [place] [address] Phone: [phone] FAX: [phone]      CC: Katheen Roselie Rockford, NP [address] [place] [address] Phone: [phone]  Fax: [phone]   Return to Endocrinology clinic as below: Future Appointments  Date Time Provider Department Center  03/09/2024  1:20 PM Nche, Roselie Rockford, NP [facility]

## 2023-10-16 ENCOUNTER — Encounter: Payer: Self-pay | Admitting: Internal Medicine

## 2023-10-16 ENCOUNTER — Ambulatory Visit (INDEPENDENT_AMBULATORY_CARE_PROVIDER_SITE_OTHER): Admitting: Internal Medicine

## 2023-10-16 VITALS — BP 114/70 | HR 87 | Ht 65.75 in | Wt 198.4 lb

## 2023-10-16 DIAGNOSIS — E0590 Thyrotoxicosis, unspecified without thyrotoxic crisis or storm: Secondary | ICD-10-CM

## 2023-10-17 LAB — TSH: TSH: 1.66 m[IU]/L

## 2023-10-17 LAB — T4, FREE: Free T4: 1.2 ng/dL (ref 0.8–1.8)

## 2023-10-17 LAB — T3, FREE: T3, Free: 3.5 pg/mL (ref 2.3–4.2)

## 2023-10-19 ENCOUNTER — Other Ambulatory Visit (HOSPITAL_COMMUNITY): Payer: Self-pay

## 2023-10-19 ENCOUNTER — Ambulatory Visit: Payer: Self-pay | Admitting: Internal Medicine

## 2023-10-19 MED ORDER — METHIMAZOLE 5 MG PO TABS
5.0000 mg | ORAL_TABLET | ORAL | 3 refills | Status: AC
  Filled 2023-10-19 – 2024-01-26 (×4): qty 78, 90d supply, fill #0

## 2023-11-19 ENCOUNTER — Ambulatory Visit (INDEPENDENT_AMBULATORY_CARE_PROVIDER_SITE_OTHER)

## 2023-11-19 DIAGNOSIS — Z23 Encounter for immunization: Secondary | ICD-10-CM

## 2023-11-19 NOTE — Progress Notes (Signed)
 Per orders of [facility], injection of Influenza given in the RT deltoid by Karna Christ, cma.  Patient tolerated injection well.

## 2023-12-14 ENCOUNTER — Other Ambulatory Visit: Payer: Self-pay | Admitting: Medical Genetics

## 2023-12-14 DIAGNOSIS — Z006 Encounter for examination for normal comparison and control in clinical research program: Secondary | ICD-10-CM

## 2024-01-26 ENCOUNTER — Other Ambulatory Visit (HOSPITAL_COMMUNITY): Payer: Self-pay

## 2024-02-05 ENCOUNTER — Ambulatory Visit: Admitting: Nurse Practitioner

## 2024-02-05 ENCOUNTER — Ambulatory Visit: Payer: Self-pay | Admitting: Nurse Practitioner

## 2024-02-05 ENCOUNTER — Encounter: Payer: Self-pay | Admitting: Nurse Practitioner

## 2024-02-05 ENCOUNTER — Other Ambulatory Visit (HOSPITAL_COMMUNITY)
Admission: RE | Admit: 2024-02-05 | Discharge: 2024-02-05 | Disposition: A | Source: Ambulatory Visit | Attending: Nurse Practitioner | Admitting: Nurse Practitioner

## 2024-02-05 VITALS — BP 118/76 | HR 92 | Temp 97.6°F | Ht 65.75 in | Wt 200.6 lb

## 2024-02-05 DIAGNOSIS — R300 Dysuria: Secondary | ICD-10-CM

## 2024-02-05 DIAGNOSIS — N898 Other specified noninflammatory disorders of vagina: Secondary | ICD-10-CM | POA: Diagnosis not present

## 2024-02-05 LAB — POCT URINALYSIS DIPSTICK
Bilirubin, UA: NEGATIVE
Blood, UA: NEGATIVE
Glucose, UA: NEGATIVE
Ketones, UA: NEGATIVE
Leukocytes, UA: NEGATIVE
Nitrite, UA: NEGATIVE
Protein, UA: NEGATIVE
Spec Grav, UA: 1.025
Urobilinogen, UA: 0.2 U/dL
pH, UA: 6

## 2024-02-05 MED ORDER — FLUCONAZOLE 150 MG PO TABS: ORAL_TABLET | ORAL | 0 refills | Status: DC

## 2024-02-05 NOTE — Patient Instructions (Signed)
 It was great to see you!  Start diflucan  1 tablet today then another in 3 days if needed  We are checking a swab and a urinalysis   Let's follow-up if symptoms worsen or don't improve   Take care,  Tinnie Harada, NP

## 2024-02-05 NOTE — Progress Notes (Signed)
 "  Acute Office Visit  Subjective:     Patient ID: Janet [name], female    DOB: 09/13/1975, 48 y.o.   MRN: [id number]  Chief Complaint  Patient presents with   Vaginal Discharge    With odor-discharge is white for 1 week    HPI Discussed the use of AI scribe software for clinical note transcription with the patient, who gave verbal consent to proceed.  History of Present Illness   [name] is a 48 year old female who presents with vaginal discharge and urinary symptoms.  She has vaginal discharge with mild odor and itching. She has had yeast infections and bacterial vaginosis in the past but not recently. She tried over-the-counter AZO and valtrex  for what she thought was a flare of a prior condition without relief.  She has frequent urination and burning with urination. Increasing water intake, which helped in the past, did not improve symptoms.  She denies stomach, pelvic, or back pain, and denies fever, nausea, or vomiting. She is sexually active.      ROS See pertinent positives and negatives per HPI.     Objective:    BP 118/76 (BP Location: Left Arm, Patient Position: Sitting, Cuff Size: Normal)   Pulse 92   Temp 97.6 F (36.4 C)   Ht 5' 5.75 (1.67 m)   Wt 200 lb 9.6 oz (91 kg)   LMP 01/13/2024 (Exact Date)   SpO2 98%   BMI 32.62 kg/m    Physical Exam Vitals and nursing note reviewed. Exam conducted with a chaperone present.  Constitutional:      General: She is not in acute distress.    Appearance: Normal appearance.  HENT:     Head: Normocephalic.  Eyes:     Conjunctiva/sclera: Conjunctivae normal.  Pulmonary:     Effort: Pulmonary effort is normal.  Abdominal:     Palpations: Abdomen is soft.     Tenderness: There is no abdominal tenderness.  Genitourinary:    General: Normal vulva.     Exam position: Supine.     Vagina: Normal.     Comments: Redness to bilateral labia Musculoskeletal:     Cervical back: Normal range of motion.   Skin:    General: Skin is warm.  Neurological:     General: No focal deficit present.     Mental Status: She is alert and oriented to person, place, and time.  Psychiatric:        Mood and Affect: Mood normal.        Behavior: Behavior normal.        Thought Content: Thought content normal.        Judgment: Judgment normal.     Results for orders placed or performed in visit on 02/05/24  POCT urinalysis dipstick  Result Value Ref Range   Color, UA     Clarity, UA     Glucose, UA Negative Negative   Bilirubin, UA Negative    Ketones, UA Negative    Spec Grav, UA 1.025 1.010 - 1.025   Blood, UA Negative    pH, UA 6.0 5.0 - 8.0   Protein, UA Negative Negative   Urobilinogen, UA 0.2 0.2 or 1.0 E.U./dL   Nitrite, UA Negative    Leukocytes, UA Negative Negative   Appearance     Odor          Assessment & Plan:   Problem List Items Addressed This Visit   None Visit Diagnoses  Vaginal discharge    -  Primary   Check swab for BV, yeast, G/C. Some redness and irritiation to labia. Will treat for yeast with diflucan  while awaiting results.   Relevant Orders   Cervicovaginal ancillary only     Dysuria       U/A negative for UTI. Encourage fluids. Will check vaginal swab as well.   Relevant Orders   POCT urinalysis dipstick (Completed)       Meds ordered this encounter  Medications   fluconazole  (DIFLUCAN ) 150 MG tablet    Sig: Take 1 tablet today and a second tablet in 3 days for ongoing symptoms    Dispense:  2 tablet    Refill:  0    Return if symptoms worsen or fail to improve.  Tinnie DELENA Harada, NP   "

## 2024-02-08 LAB — CERVICOVAGINAL ANCILLARY ONLY
Bacterial Vaginitis (gardnerella): NEGATIVE
Candida Glabrata: NEGATIVE
Candida Vaginitis: POSITIVE — AB
Chlamydia: NEGATIVE
Comment: NEGATIVE
Comment: NEGATIVE
Comment: NEGATIVE
Comment: NEGATIVE
Comment: NEGATIVE
Comment: NORMAL
Neisseria Gonorrhea: NEGATIVE
Trichomonas: NEGATIVE

## 2024-03-09 ENCOUNTER — Ambulatory Visit: Admitting: Nurse Practitioner

## 2024-03-09 VITALS — BP 122/70 | HR 86 | Temp 98.0°F | Ht 66.0 in | Wt 197.6 lb

## 2024-03-09 DIAGNOSIS — R748 Abnormal levels of other serum enzymes: Secondary | ICD-10-CM | POA: Diagnosis not present

## 2024-03-09 DIAGNOSIS — R7303 Prediabetes: Secondary | ICD-10-CM

## 2024-03-09 LAB — LIPASE: Lipase: 62 U/L — ABNORMAL HIGH (ref 11.0–59.0)

## 2024-03-09 LAB — HEMOGLOBIN A1C: Hgb A1c MFr Bld: 6.2 % (ref 4.6–6.5)

## 2024-03-09 NOTE — Assessment & Plan Note (Signed)
 No ABDOMEN pain Repeat lipase today

## 2024-03-09 NOTE — Assessment & Plan Note (Signed)
 Repeat hgba1c Encouraged to maintain a heart healthy diet and daily exercise

## 2024-03-09 NOTE — Progress Notes (Signed)
 "               Established Patient Visit  Patient: Janet [name]   DOB: [birth year]   49 y.o. Female  MRN: [id number] Visit Date: 03/09/2024  Subjective:    Chief Complaint  Patient presents with   Follow-up    6 month follow up for prediabetes  Urine still has an odor    HPI Prediabetes Repeat hgba1c Encouraged to maintain a heart healthy diet and daily exercise  Elevated lipase No ABDOMEN pain Repeat lipase today  Today she reports intermittent strong urine odor x 939month. Urine color varies from clear to yellow, no sediment, no foam, no cloudy, no blood, no dysuria, no sediment. No abdominal/pelvic pain. Reports she drinks about 60oz of water daily. She had UA and wet prep completed 939month ago: normal  Wt Readings from Last 3 Encounters:  03/09/24 197 lb 9.6 oz (89.6 kg)  02/05/24 200 lb 9.6 oz (91 kg)  10/16/23 198 lb 6.4 oz (90 kg)    Reviewed medical, surgical, and social history today  Medications: Show/hide medication list[1] Reviewed past medical and social history.   ROS per HPI above      Objective:  BP 122/70 (BP Location: Left Arm, Patient Position: Sitting, Cuff Size: Large)   Pulse 86   Temp 98 F (36.7 C) (Oral)   Ht 5' 6 (1.676 m)   Wt 197 lb 9.6 oz (89.6 kg)   LMP 02/12/2024   SpO2 99%   BMI 31.89 kg/m      Physical Exam Vitals and nursing note reviewed.  Cardiovascular:     Rate and Rhythm: Normal rate.     Pulses: Normal pulses.  Pulmonary:     Effort: Pulmonary effort is normal.  Neurological:     Mental Status: She is alert and oriented to person, place, and time.     No results found for any visits on 03/09/24.    Assessment & Plan:    Problem List Items Addressed This Visit     Elevated lipase   No ABDOMEN pain Repeat lipase today      Relevant Orders   Lipase   Prediabetes - Primary   Repeat hgba1c Encouraged to maintain a heart healthy diet and daily exercise      Relevant Orders   Hemoglobin A1c   Urine  odor due to hydration status and food consumption. Provided reassurance about current symptom: not related to any renal function abnormality. Encourage to maintain adequate oral hydration, and keep food log to help determine which food might be a corporate.  Return in about 6 months (around 09/06/2024) for CPE (fasting).     Roselie Mood, NP      [1]  Outpatient Medications Prior to Visit  Medication Sig   cholecalciferol (VITAMIN D3) 25 MCG (1000 UNIT) tablet 1 tablet (Patient taking differently: Take 2,000 Units by mouth daily.)   fexofenadine-pseudoephedrine (ALLEGRA-D 24) 180-240 MG 24 hr tablet Take 1 tablet by mouth daily. (Patient taking differently: Take 1 tablet by mouth daily. As needed)   methimazole  (TAPAZOLE ) 5 MG tablet Take 1 tablet (5 mg total) by mouth daily as directed Monday through Saturday and none on Sundays.   valACYclovir  (VALTREX ) 500 MG tablet Take 1 tablet (500 mg total) by mouth 2 (two) times daily at onset of symptoms for 3 days. (Patient taking differently: Take 500 mg by mouth 2 (two) times daily. As needed)   [DISCONTINUED] fluconazole  (DIFLUCAN ) 150 MG tablet Take 1  tablet today and a second tablet in 3 days for ongoing symptoms (Patient not taking: Reported on 03/09/2024)   [DISCONTINUED] naproxen  (NAPROSYN ) 500 MG tablet Take 1 tablet (500 mg total) by mouth 2 (two) times daily as needed. (Patient not taking: Reported on 03/09/2024)   No facility-administered medications prior to visit.   "

## 2024-03-09 NOTE — Patient Instructions (Signed)
 Go to lab  Mediterranean Diet A Mediterranean diet is based on the traditions of countries on the [employer]. It focuses on eating more: Fruits and vegetables. Whole grains, beans, nuts, and seeds. Heart-healthy fats. These are fats that are good for your heart. It involves eating less: Dairy. Meat and eggs. Processed foods with added sugar, salt, and fat. This type of diet can help prevent certain conditions. It can also improve outcomes if you have a long-term (chronic) disease, such as kidney or heart disease. What are tips for following this plan? Reading food labels Check packaged foods for: The serving size. For foods such as rice and pasta, the serving size is the amount of cooked product, not dry. The total fat. Avoid foods with saturated fat or trans fat. Added sugars, such as corn syrup. Shopping  Try to have a balanced diet. Buy a variety of foods, such as: Fresh fruits and vegetables. You may be able to get these from local farmers markets. You can also buy them frozen. Grains, beans, nuts, and seeds. Some of these can be bought in bulk. Fresh seafood. Poultry and eggs. Low-fat dairy products. Buy whole ingredients instead of foods that have already been packaged. If you can't get fresh seafood, buy precooked frozen shrimp or canned fish, such as tuna, salmon, or sardines. Stock your pantry so you always have certain foods on hand, such as olive oil, canned tuna, canned tomatoes, rice, pasta, and beans. Cooking Cook foods with extra-virgin olive oil instead of using butter or other vegetable oils. Have meat as a side dish. Have vegetables or grains as your main dish. This means having meat in small portions or adding small amounts of meat to foods like pasta or stew. Use beans or vegetables instead of meat in common dishes like chili or lasagna. Try out different cooking methods. Try roasting, broiling, steaming, and sauting vegetables. Add frozen vegetables to  soups, stews, pasta, or rice. Add nuts or seeds for added healthy fats and plant protein at each meal. You can add these to yogurt, salads, or vegetable dishes. Marinate fish or vegetables using olive oil, lemon juice, garlic, and fresh herbs. Meal planning Plan to eat a vegetarian meal one day each week. Try to work up to two vegetarian meals, if possible. Eat seafood two or more times a week. Have healthy snacks on hand. These may include: Vegetable sticks with hummus. Greek yogurt. Fruit and nut trail mix. Eat balanced meals. These should include: Fruit: 2-3 servings a day. Vegetables: 4-5 servings a day. Low-fat dairy: 2 servings a day. Fish, poultry, or lean meat: 1 serving a day. Beans and legumes: 2 or more servings a week. Nuts and seeds: 1-2 servings a day. Whole grains: 6-8 servings a day. Extra-virgin olive oil: 3-4 servings a day. Limit red meat and sweets to just a few servings a month. Lifestyle  Try to cook and eat meals with your family. Drink enough fluid to keep your pee (urine) pale yellow. Be active every day. This includes: Aerobic exercise, which is exercise that causes your heart to beat faster. Examples include running and swimming. Leisure activities like gardening, walking, or housework. Get 7-8 hours of sleep each night. Drink red wine if your provider says you can. A glass of wine is 5 oz (150 mL). You may be allowed to have: Up to 1 glass a day if you're female and not pregnant. Up to 2 glasses a day if you're female. What foods should I eat?  Fruits Apples. Apricots. Avocado. Berries. Bananas. Cherries. Dates. Figs. Grapes. Lemons. Melon. Oranges. Peaches. Plums. Pomegranate. Vegetables Artichokes. Beets. Broccoli. Cabbage. Carrots. Eggplant. Green beans. Chard. Kale. Spinach. Onions. Leeks. Peas. Squash. Tomatoes. Peppers. Radishes. Grains Whole-grain pasta. Brown rice. Bulgur wheat. Polenta. Couscous. Whole-wheat bread. Orpah Cobb. Meats and  other proteins Beans. Almonds. Sunflower seeds. Pine nuts. Peanuts. Cod. Salmon. Scallops. Shrimp. Tuna. Tilapia. Clams. Oysters. Eggs. Chicken or [place] without skin. Dairy Low-fat milk. Cheese. Greek yogurt. Fats and oils Extra-virgin olive oil. Avocado oil. Grapeseed oil. Beverages Water. Red wine. Herbal tea. Sweets and desserts Greek yogurt with honey. Baked apples. Poached pears. Trail mix. Seasonings and condiments Basil. Cilantro. Coriander. Cumin. Mint. Parsley. Sage. Rosemary. Tarragon. Garlic. Oregano. Thyme. Pepper. Balsamic vinegar. Tahini. Hummus. Tomato sauce. Olives. Mushrooms. The items listed above may not be all the foods and drinks you can have. Talk to a dietitian to learn more. What foods should I limit? This is a list of foods that should be eaten rarely. Fruits Fruit canned in syrup. Vegetables Deep-fried potatoes, like [place] fries. Grains Packaged pasta or rice dishes. Cereal with added sugar. Snacks with added sugar. Meats and other proteins Beef. Pork. Lamb. Chicken or [place] with skin. Hot dogs. Tomasa Blase. Dairy Ice cream. Sour cream. Whole milk. Fats and oils Butter. Canola oil. Vegetable oil. Beef fat (tallow). Lard. Beverages Juice. Sugar-sweetened soft drinks. Beer. Liquor and spirits. Sweets and desserts Cookies. Cakes. Pies. Candy. Seasonings and condiments Mayonnaise. Pre-made sauces and marinades. The items listed above may not be all the foods and drinks you should limit. Talk to a dietitian to learn more. Where to find more information American Heart Association (AHA): heart.org This information is not intended to replace advice given to you by your health care provider. Make sure you discuss any questions you have with your health care provider. Document Revised: 05/11/2022 Document Reviewed: 05/11/2022 Elsevier Patient Education  2024 [employer].

## 2024-03-10 ENCOUNTER — Ambulatory Visit: Payer: Self-pay | Admitting: Nurse Practitioner

## 2024-04-22 ENCOUNTER — Ambulatory Visit: Admitting: Internal Medicine

## 2024-05-23 IMAGING — MG MM DIGITAL DIAGNOSTIC UNILAT*R* W/ TOMO W/ CAD
6 series · 6 of 18 positions shown · non-contrast
Comparison: Previous exam(s).

CLINICAL DATA: 46-year-old female presenting as a recall from
screening for possible right breast asymmetry.

EXAM:
DIGITAL DIAGNOSTIC UNILATERAL RIGHT MAMMOGRAM WITH TOMOSYNTHESIS AND
CAD
TECHNIQUE: Right digital diagnostic mammography and breast tomosynthesis was
performed. The images were evaluated with computer-aided detection.

[R MLO synth-2D]
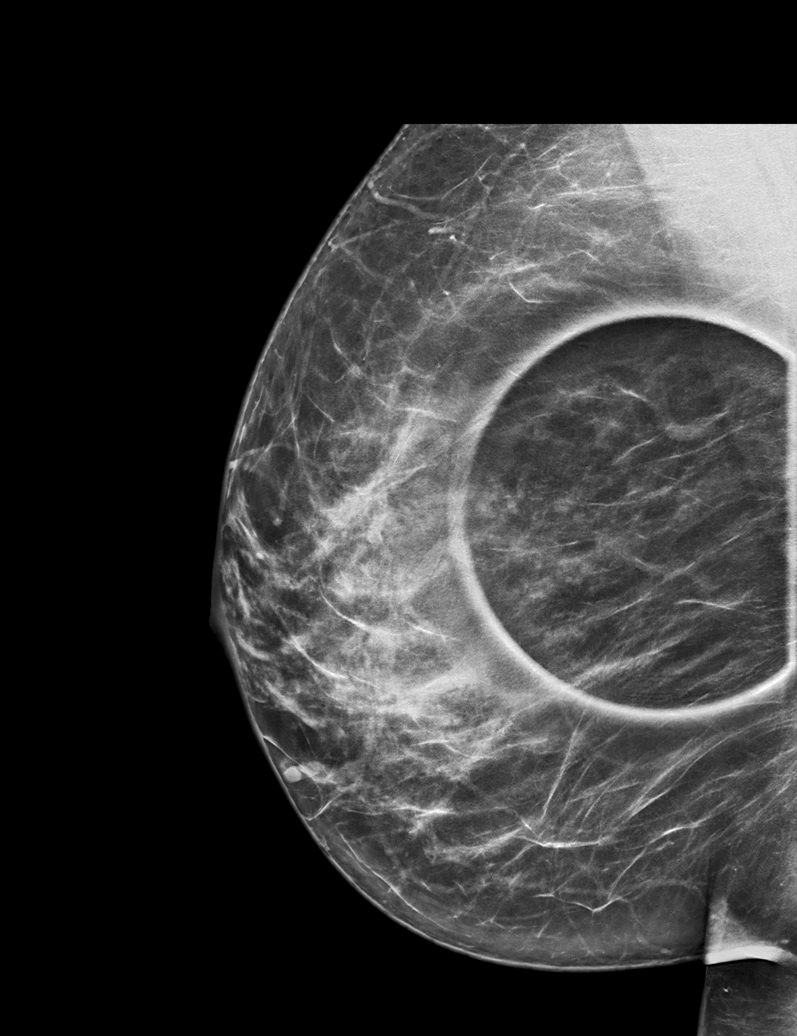

[R XCCL synth-2D]
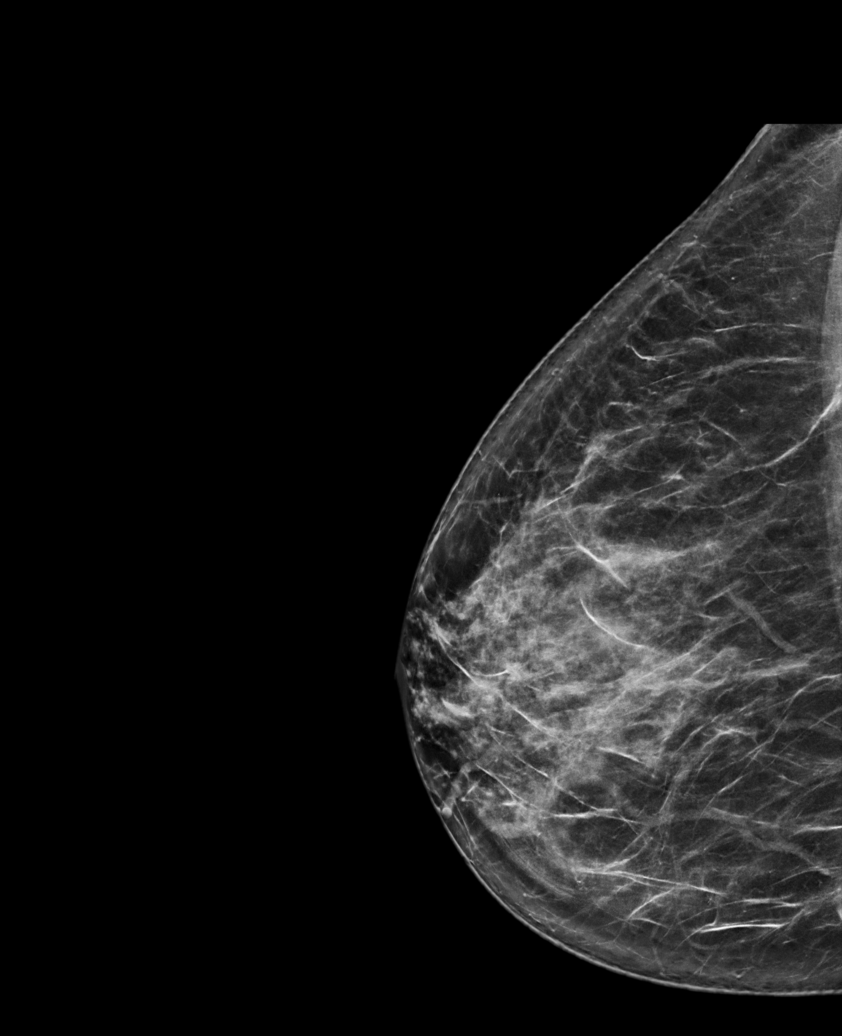

[R CC synth-2D]
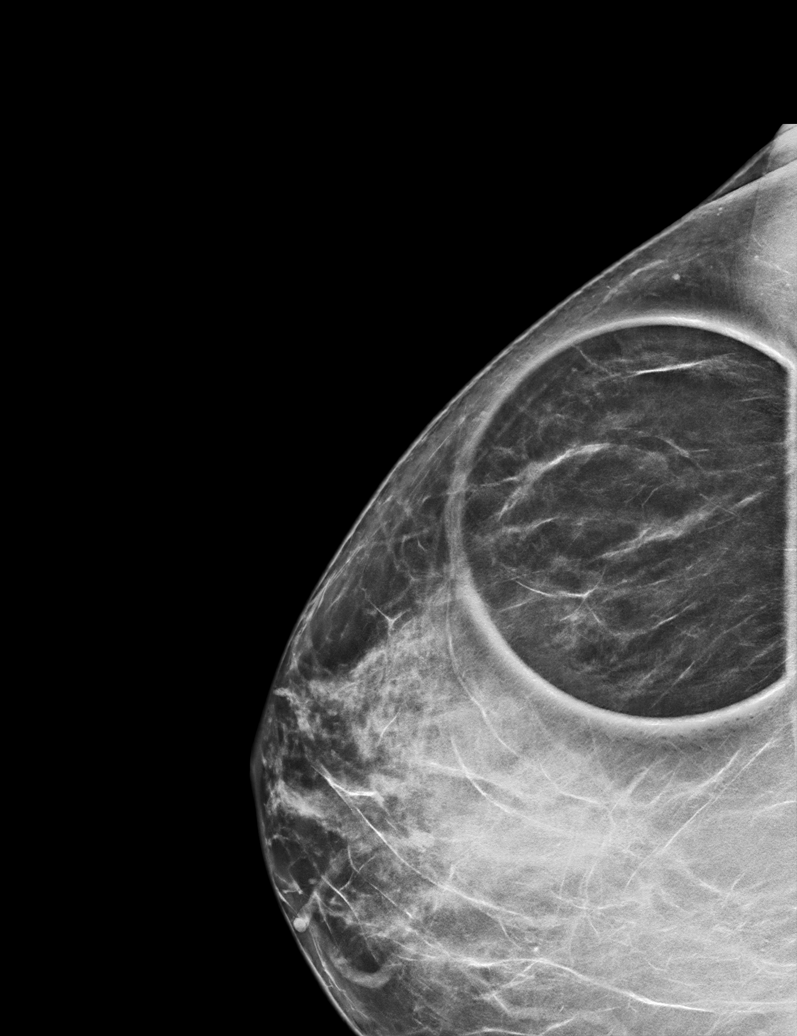

[R MLO tomo · tomo slice 39/76.0]
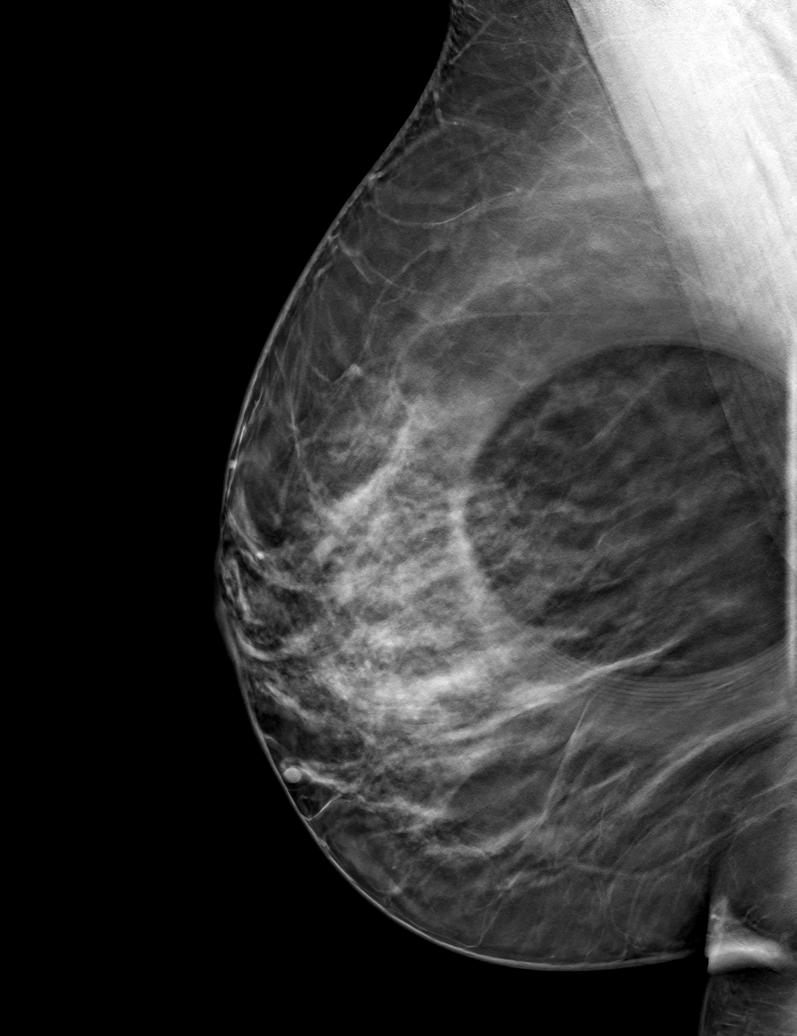

[R CC tomo · tomo slice 39/78.0]
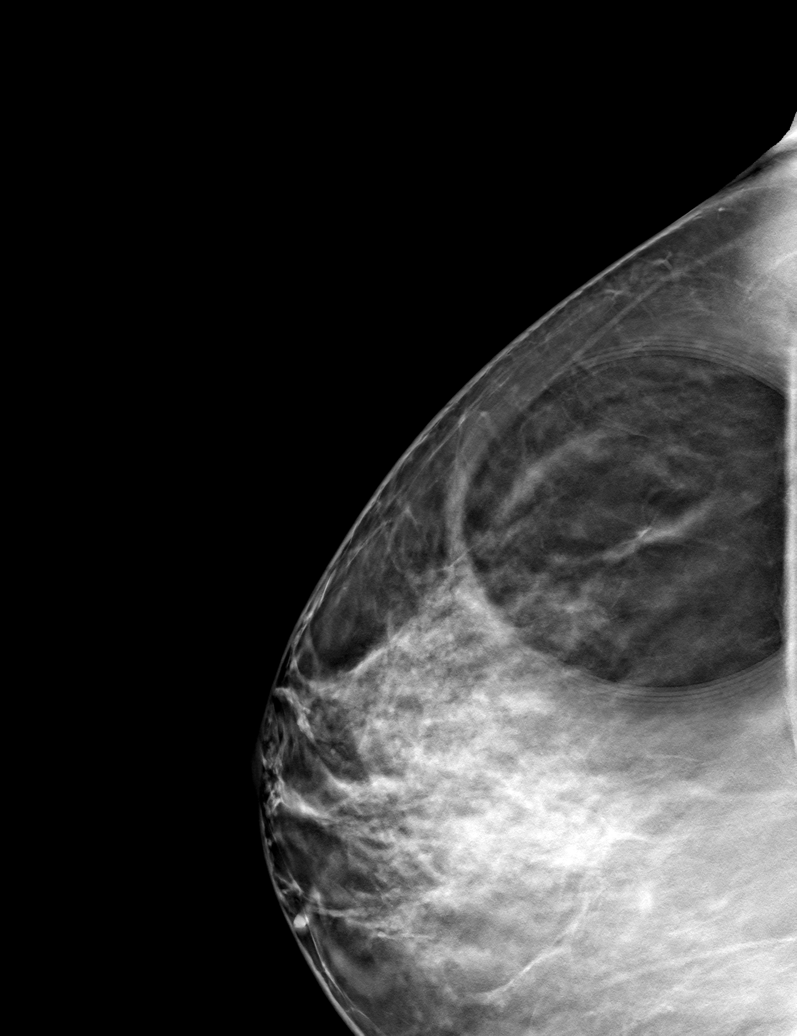

[R XCCL tomo · tomo slice 41/81.0]
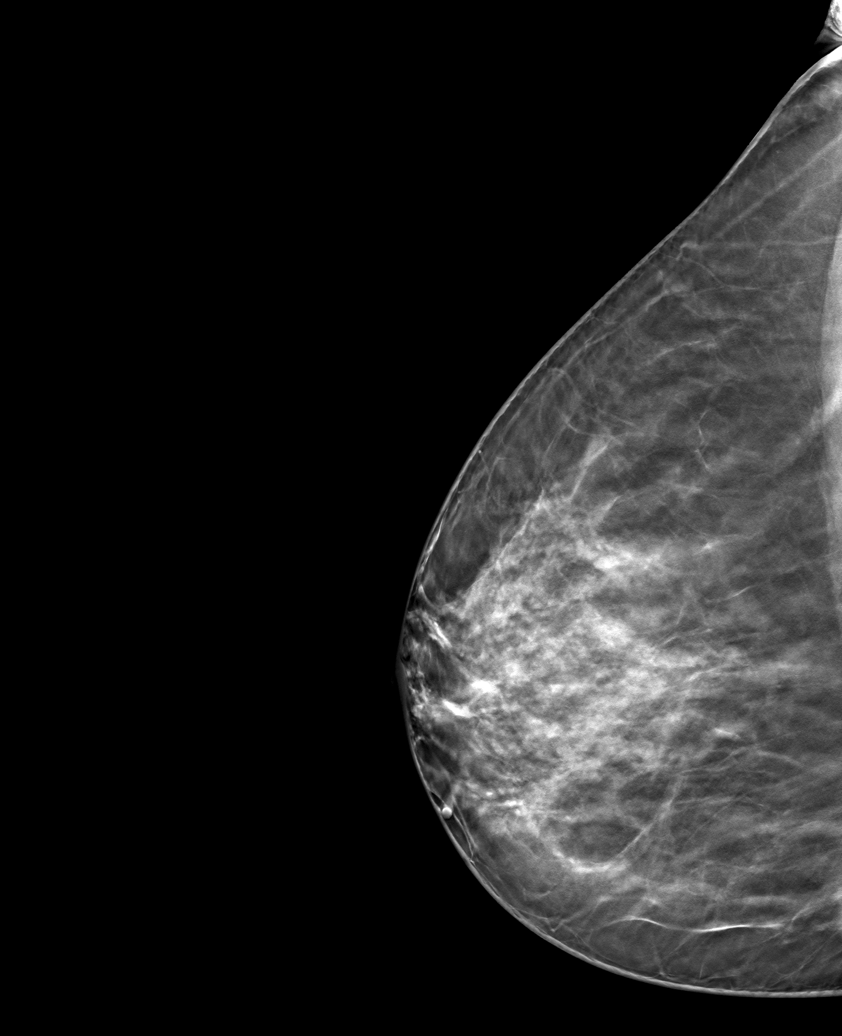

[6 of 18 positions shown; findings below may reference images not displayed]

ACR Breast Density Category c: The breast tissue is heterogeneously
dense, which may obscure small masses.
FINDINGS: Full field exaggerated lateral CC as well as spot compression
tomosynthesis views of the right breast were performed for a
questioned asymmetry in the outer far posterior right breast. On the
additional imaging the tissue in this area disperses without
persistent asymmetry, mass, or distortion. The finding on screening
mammogram is most consistent with normal fibroglandular tissue.
There are no new findings elsewhere in the right breast.
IMPRESSION: No mammographic evidence of malignancy in the right breast.

RECOMMENDATION:
Screening mammogram in one year.(Code:[id number])

I have discussed the findings and recommendations with the patient.
If applicable, a reminder letter will be sent to the patient
regarding the next appointment.

BI-RADS CATEGORY  1: Negative.

## 2024-09-06 ENCOUNTER — Ambulatory Visit: Admitting: Nurse Practitioner
